# Patient Record
Sex: Female | Born: 1975 | Race: White | Hispanic: No | Marital: Married | Smoking: Former smoker
Health system: Southern US, Community
[De-identification: ages and names within clinical notes are randomized; demographics above are authoritative.]

## PROBLEM LIST (undated history)

## (undated) DIAGNOSIS — J302 Other seasonal allergic rhinitis: Secondary | ICD-10-CM

## (undated) DIAGNOSIS — J329 Chronic sinusitis, unspecified: Secondary | ICD-10-CM

## (undated) DIAGNOSIS — F909 Attention-deficit hyperactivity disorder, unspecified type: Secondary | ICD-10-CM

## (undated) DIAGNOSIS — G510 Bell's palsy: Secondary | ICD-10-CM

## (undated) DIAGNOSIS — G4700 Insomnia, unspecified: Secondary | ICD-10-CM

## (undated) DIAGNOSIS — O139 Gestational [pregnancy-induced] hypertension without significant proteinuria, unspecified trimester: Secondary | ICD-10-CM

## (undated) HISTORY — DX: Attention-deficit hyperactivity disorder, unspecified type: F90.9

## (undated) HISTORY — PX: WISDOM TOOTH EXTRACTION: SHX21

## (undated) HISTORY — DX: Chronic sinusitis, unspecified: J32.9

## (undated) HISTORY — DX: Bell's palsy: G51.0

---

## 1997-05-25 ENCOUNTER — Other Ambulatory Visit: Admission: RE | Admit: 1997-05-25 | Discharge: 1997-05-25 | Payer: Self-pay | Admitting: Obstetrics and Gynecology

## 1997-07-19 ENCOUNTER — Other Ambulatory Visit: Admission: RE | Admit: 1997-07-19 | Discharge: 1997-07-19 | Payer: Self-pay | Admitting: Obstetrics and Gynecology

## 1997-12-23 ENCOUNTER — Emergency Department (HOSPITAL_COMMUNITY): Admission: EM | Admit: 1997-12-23 | Discharge: 1997-12-23 | Payer: Self-pay | Admitting: Emergency Medicine

## 1998-02-02 ENCOUNTER — Other Ambulatory Visit: Admission: RE | Admit: 1998-02-02 | Discharge: 1998-02-02 | Payer: Self-pay | Admitting: Obstetrics and Gynecology

## 1998-08-15 ENCOUNTER — Other Ambulatory Visit: Admission: RE | Admit: 1998-08-15 | Discharge: 1998-08-15 | Payer: Self-pay | Admitting: Obstetrics and Gynecology

## 1998-12-19 ENCOUNTER — Encounter: Payer: Self-pay | Admitting: Emergency Medicine

## 1998-12-19 ENCOUNTER — Emergency Department (HOSPITAL_COMMUNITY): Admission: EM | Admit: 1998-12-19 | Discharge: 1998-12-19 | Payer: Self-pay | Admitting: Emergency Medicine

## 1999-07-31 ENCOUNTER — Other Ambulatory Visit: Admission: RE | Admit: 1999-07-31 | Discharge: 1999-07-31 | Payer: Self-pay | Admitting: Obstetrics and Gynecology

## 2000-10-16 ENCOUNTER — Other Ambulatory Visit: Admission: RE | Admit: 2000-10-16 | Discharge: 2000-10-16 | Payer: Self-pay | Admitting: Obstetrics and Gynecology

## 2001-12-22 ENCOUNTER — Other Ambulatory Visit: Admission: RE | Admit: 2001-12-22 | Discharge: 2001-12-22 | Payer: Self-pay | Admitting: Obstetrics and Gynecology

## 2002-08-18 ENCOUNTER — Other Ambulatory Visit: Admission: RE | Admit: 2002-08-18 | Discharge: 2002-08-18 | Payer: Self-pay | Admitting: Obstetrics and Gynecology

## 2003-04-22 ENCOUNTER — Other Ambulatory Visit: Admission: RE | Admit: 2003-04-22 | Discharge: 2003-04-22 | Payer: Self-pay | Admitting: Obstetrics and Gynecology

## 2004-05-18 ENCOUNTER — Other Ambulatory Visit: Admission: RE | Admit: 2004-05-18 | Discharge: 2004-05-18 | Payer: Self-pay | Admitting: Obstetrics and Gynecology

## 2005-06-14 ENCOUNTER — Other Ambulatory Visit: Admission: RE | Admit: 2005-06-14 | Discharge: 2005-06-14 | Payer: Self-pay | Admitting: Obstetrics & Gynecology

## 2006-07-11 ENCOUNTER — Other Ambulatory Visit: Admission: RE | Admit: 2006-07-11 | Discharge: 2006-07-11 | Payer: Self-pay | Admitting: Obstetrics and Gynecology

## 2007-07-16 ENCOUNTER — Other Ambulatory Visit: Admission: RE | Admit: 2007-07-16 | Discharge: 2007-07-16 | Payer: Self-pay | Admitting: Obstetrics and Gynecology

## 2007-10-19 ENCOUNTER — Emergency Department (HOSPITAL_COMMUNITY): Admission: EM | Admit: 2007-10-19 | Discharge: 2007-10-19 | Payer: Self-pay | Admitting: Emergency Medicine

## 2010-12-29 ENCOUNTER — Emergency Department: Payer: Self-pay | Admitting: *Deleted

## 2012-06-18 ENCOUNTER — Encounter: Payer: Self-pay | Admitting: Nurse Practitioner

## 2012-06-19 ENCOUNTER — Encounter: Payer: Self-pay | Admitting: Nurse Practitioner

## 2012-06-19 ENCOUNTER — Ambulatory Visit (INDEPENDENT_AMBULATORY_CARE_PROVIDER_SITE_OTHER): Payer: No Typology Code available for payment source | Admitting: Nurse Practitioner

## 2012-06-19 VITALS — BP 116/70 | HR 68 | Ht 70.5 in | Wt 197.2 lb

## 2012-06-19 DIAGNOSIS — Z0000 Encounter for general adult medical examination without abnormal findings: Secondary | ICD-10-CM

## 2012-06-19 DIAGNOSIS — Z01419 Encounter for gynecological examination (general) (routine) without abnormal findings: Secondary | ICD-10-CM

## 2012-06-19 DIAGNOSIS — R350 Frequency of micturition: Secondary | ICD-10-CM

## 2012-06-19 LAB — POCT URINALYSIS DIPSTICK
Leukocytes, UA: NEGATIVE
Spec Grav, UA: 1.015
Urobilinogen, UA: NEGATIVE
pH, UA: 5.5

## 2012-06-19 NOTE — Progress Notes (Signed)
Encounter reviewed by Dr. Tanasha Menees Silva.  

## 2012-06-19 NOTE — Progress Notes (Signed)
37 y.o. G1P0 Married Caucasian Fe here for annual exam.  Went off OCP in October, since then menses from December have been regular.  Lasting for 3 days light, still cramps but relief with OTC NSAID'S.  Now condoms each time. They got married in September last year and now husband is ready to start a family.  She is a little reluctant.   She is noting a urinary frequency for about 6 months without other bladder symptoms.  No fever, chills, or dysuria. No other new health issues. 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   Patient's last menstrual period was 06/04/2012.          Sexually active: yes    The current method of family planning is condoms most of the time.    Exercising: yes  Home exercise routine includes dance . Smoker:  Quit 2011  Health Maintenance: Pap:  03/26/2011   ASCUS with negative HPV MMG:  Never  TDaP:  07/2007 Labs: Hgb- 14.2   does not have a smoking history on file. She has never used smokeless tobacco. She reports that she drinks about 3.0 ounces of alcohol per week. She reports that she does not use illicit drugs.  History reviewed. No pertinent past medical history.  History reviewed. No pertinent past surgical history.  Current Outpatient Prescriptions  Medication Sig Dispense Refill  . amphetamine-dextroamphetamine (ADDERALL) 5 MG tablet Take 5 mg by mouth daily.      . Multiple Vitamins-Minerals (MULTIVITAMIN PO) Take by mouth daily.      Marland Kitchen zolpidem (AMBIEN) 5 MG tablet Take 5 mg by mouth at bedtime as needed for sleep.      Lorita Officer Triphasic (ORTHO TRI-CYCLEN LO PO) Take by mouth.       No current facility-administered medications for this visit.    History reviewed. No pertinent family history.  ROS:  Pertinent items are noted in HPI.  Otherwise, a comprehensive ROS was negative.  Exam:   BP 116/70  Pulse 68  Ht 5' 10.5" (1.791 m)  Wt 197 lb 3.2 oz (89.449 kg)  BMI 27.89 kg/m2  LMP  06/04/2012 Height: 5' 10.5" (179.1 cm)  Ht Readings from Last 3 Encounters:  06/19/12 5' 10.5" (1.791 m)    General appearance: alert, cooperative and appears stated age Head: Normocephalic, without obvious abnormality, atraumatic Neck: no adenopathy, supple, symmetrical, trachea midline and thyroid normal to inspection and palpation Lungs: clear to auscultation bilaterally Breasts: normal appearance, no masses or tenderness Heart: regular rate and rhythm Abdomen: soft, non-tender; no masses,  no organomegaly Extremities: extremities normal, atraumatic, no cyanosis or edema Skin: Skin color, texture, turgor normal. No rashes or lesions Lymph nodes: Cervical, supraclavicular, and axillary nodes normal. No abnormal inguinal nodes palpated Neurologic: Grossly normal   Pelvic: External genitalia:  no lesions              Urethra:  normal appearing urethra with no masses, tenderness or lesions              Bartholin's and Skene's: normal                 Vagina: normal appearing vagina with normal color and discharge, no lesions              Cervix: anteverted              Pap taken: yes Bimanual Exam:  Uterus:  normal size, contour,  position, consistency, mobility, non-tender              Adnexa: no mass, fullness, tenderness               Rectovaginal: Confirms               Anus:  normal sphincter tone, no lesions  A:  Well Woman with normal exam  Regular menses off OCP  Condoms for birth control, may consider starting family soon  R/O UTI as cause of urinary frequency.  P:   Pap smear as per guidelines   Will start on prenatal MVI instead of other MVI in case she decides on pregnancy  She is given information about pregnancy considerations.  return annually or prn   An After Visit Summary was printed and given to the patient.

## 2012-06-19 NOTE — Patient Instructions (Addendum)
EXERCISE AND DIET:  We recommended that you start or continue a regular exercise program for good health. Regular exercise means any activity that makes your heart beat faster and makes you sweat.  We recommend exercising at least 30 minutes per day at least 3 days a week, preferably 4 or 5.  We also recommend a diet low in fat and sugar.  Inactivity, poor dietary choices and obesity can cause diabetes, heart attack, stroke, and kidney damage, among others.    ALCOHOL AND SMOKING:  Women should limit their alcohol intake to no more than 7 drinks/beers/glasses of wine (combined, not each!) per week. Moderation of alcohol intake to this level decreases your risk of breast cancer and liver damage. And of course, no recreational drugs are part of a healthy lifestyle.  And absolutely no smoking or even second hand smoke. Most people know smoking can cause heart and lung diseases, but did you know it also contributes to weakening of your bones? Aging of your skin?  Yellowing of your teeth and nails?  CALCIUM AND VITAMIN D:  Adequate intake of calcium and Vitamin D are recommended.  The recommendations for exact amounts of these supplements seem to change often, but generally speaking 600 mg of calcium (either carbonate or citrate) and 800 units of Vitamin D per day seems prudent. Certain women may benefit from higher intake of Vitamin D.  If you are among these women, your doctor will have told you during your visit.    PAP SMEARS:  Pap smears, to check for cervical cancer or precancers,  have traditionally been done yearly, although recent scientific advances have shown that most women can have pap smears less often.  However, every woman still should have a physical exam from her gynecologist every year. It will include a breast check, inspection of the vulva and vagina to check for abnormal growths or skin changes, a visual exam of the cervix, and then an exam to evaluate the size and shape of the uterus and  ovaries.  And after 37 years of age, a rectal exam is indicated to check for rectal cancers. We will also provide age appropriate advice regarding health maintenance, like when you should have certain vaccines, screening for sexually transmitted diseases, bone density testing, colonoscopy, mammograms, etc.   MAMMOGRAMS:  All women over 40 years old should have a yearly mammogram. Many facilities now offer a "3D" mammogram, which may cost around $50 extra out of pocket. If possible,  we recommend you accept the option to have the 3D mammogram performed.  It both reduces the number of women who will be called back for extra views which then turn out to be normal, and it is better than the routine mammogram at detecting truly abnormal areas.    COLONOSCOPY:  Colonoscopy to screen for colon cancer is recommended for all women at age 50.  We know, you hate the idea of the prep.  We agree, BUT, having colon cancer and not knowing it is worse!!  Colon cancer so often starts as a polyp that can be seen and removed at colonscopy, which can quite literally save your life!  And if your first colonoscopy is normal and you have no family history of colon cancer, most women don't have to have it again for 10 years.  Once every ten years, you can do something that may end up saving your life, right?  We will be happy to help you get it scheduled when you are ready.    Be sure to check your insurance coverage so you understand how much it will cost.  It may be covered as a preventative service at no cost, but you should check your particular policy.    Preparing for Pregnancy Preparing for pregnancy (preconceptual care) by getting counseling and information from your caregiver before getting pregnant is a good idea. It will help you and your baby have a better chance to have a healthy, safe pregnancy and delivery of your baby. Make an appointment with your caregiver to talk about your health, medical, and family history and  how to prepare yourself before getting pregnant. Your caregiver will do a complete physical exam and a Pap test. They will want to know:  About you, your spouse or partner, and your family's medical and genetic history.  If you are eating a balanced diet and drinking enough fluids.  What vitamins and mineral supplements you are taking. This includes taking folic acid before getting pregnant to help prevent birth defects.  What medications you are taking including prescription, over-the-counter and herbal medications.  If there is any substance abuse like alcohol, smoking, and illegal drugs.  If there is any mental or physical domestic violence.  If there is any risk of sexually transmitted disease between you and your partner.  What immunizations and vaccinations you have had and what you may need before getting pregnant.  If you should get tested for HIV infection.  If there is any exposure to chemical or toxic substances at home or work.  If there are medical problems you have that need to be treated and kept under control before getting pregnant such as diabetes, high blood pressure or others.  If there were any past surgeries, pregnancies and problems with them.  What your current weight is and to set a goal as to how much weight you should gain while pregnant. Also, they will check if you should lose or gain weight before getting pregnant.  What is your exercise routine and what it is safe when you are pregnant.  If there are any physical disabilities that need to be addressed.  About spacing your pregnancies when there are other children.  If there is a financial problem that may affect you having a child. After talking about the above points with your caregiver, your caregiver will give you advice on how to help treat and work with you on solving any issues, if necessary, before getting pregnant. The goal is to have a healthy and safe pregnancy for you and your baby. You  should keep an accurate record of your menstrual periods because it will help in determining your due date. Immunizations that you should have before getting pregnant:   Regular measles, [place] measles (rubella) and mumps.  Tetanus and diphtheria.  Chickenpox, if not immune.  Herpes zoster (Varicella) if not immune.  Human papilloma virus vaccine (HPV) between the age of 56 and 24 years old.  Hepatitis A vaccine.  Hepatitis B vaccine.  Influenza vaccine.  Pneumococcal vaccine (pneumonia). You should avoid getting pregnant for one month after getting vaccinated with a live virus vaccine such as [place] measles (rubella) vaccine. Other immunizations may be necessary depending on where you live, such as malaria. Ask your caregiver if any other immunizations are needed for you. HOME CARE INSTRUCTIONS   Follow the advice of your caregiver.  Before getting pregnant:  Begin taking vitamins, supplements, and 0.4 milligrams folic acid daily.  Get your immunizations up-to-date.  Get help from a  nutrition counselor if you do not understand what a balanced diet is, need help with a special medical diet or if you need help to lose or gain weight.  Begin exercising.  Stop smoking, taking illegal drugs, and drinking alcoholic beverages.  Get counseling if there is and type of domestic violence.  Get checked for sexually transmitted diseases including HIV.  Get any medical problems under control (diabetes, high blood pressure, convulsions, asthma or others).  Resolve any financial concerns.  Be sure you and your spouse or partner are ready to have a baby.  Keep an accurate record of your menstrual periods. Document Released: 01/12/2008 Document Revised: 04/23/2011 Document Reviewed: 01/12/2008 [facility] Patient Information 2013 [place], [place].

## 2012-06-20 LAB — IPS PAP TEST WITH REFLEX TO HPV

## 2012-06-20 LAB — HEMOGLOBIN, FINGERSTICK: Hemoglobin, fingerstick: 14.2 g/dL (ref 12.0–16.0)

## 2012-06-21 LAB — URINE CULTURE
Colony Count: NO GROWTH
Organism ID, Bacteria: NO GROWTH

## 2012-06-23 ENCOUNTER — Telehealth: Payer: Self-pay | Admitting: *Deleted

## 2012-06-23 NOTE — Telephone Encounter (Signed)
Message copied by Osie Bond on Mon Jun 23, 2012 10:48 AM ------      Message from: Ria Comment R      Created: Mon Jun 23, 2012  8:18 AM       Let patient know that urine culture is negative, pap 02. ------

## 2012-06-23 NOTE — Telephone Encounter (Signed)
LVM for pt to return my call in regards to urine culture results.  

## 2012-06-24 ENCOUNTER — Telehealth: Payer: Self-pay | Admitting: *Deleted

## 2012-06-24 NOTE — Telephone Encounter (Signed)
LVM for pt to return my call in regards to urine culture results.  

## 2012-06-24 NOTE — Telephone Encounter (Signed)
Message copied by Osie Bond on Tue Jun 24, 2012  4:28 PM ------      Message from: Ria Comment R      Created: Mon Jun 23, 2012  8:18 AM       Let patient know that urine culture is negative, pap 02. ------

## 2012-06-30 ENCOUNTER — Telehealth: Payer: Self-pay | Admitting: *Deleted

## 2012-06-30 NOTE — Telephone Encounter (Signed)
Message copied by Osie Bond on Mon Jun 30, 2012 10:57 AM ------      Message from: Ria Comment R      Created: Mon Jun 23, 2012  8:18 AM       Let patient know that urine culture is negative, pap 02. ------

## 2012-06-30 NOTE — Telephone Encounter (Signed)
Patient returned phone call to [facility] about test results.

## 2012-06-30 NOTE — Telephone Encounter (Signed)
3rd voice mail left for pt in regards to urine culture/pap results.

## 2012-07-01 ENCOUNTER — Telehealth: Payer: Self-pay | Admitting: *Deleted

## 2012-07-01 NOTE — Telephone Encounter (Signed)
Pt is aware of negative results.

## 2013-09-15 ENCOUNTER — Ambulatory Visit (INDEPENDENT_AMBULATORY_CARE_PROVIDER_SITE_OTHER): Payer: BC Managed Care – PPO | Admitting: Nurse Practitioner

## 2013-09-15 ENCOUNTER — Encounter: Payer: Self-pay | Admitting: Nurse Practitioner

## 2013-09-15 VITALS — BP 104/66 | HR 64 | Ht 70.0 in | Wt 198.0 lb

## 2013-09-15 DIAGNOSIS — Z01419 Encounter for gynecological examination (general) (routine) without abnormal findings: Secondary | ICD-10-CM

## 2013-09-15 DIAGNOSIS — Z0000 Encounter for general adult medical examination without abnormal findings: Secondary | ICD-10-CM

## 2013-09-15 DIAGNOSIS — R350 Frequency of micturition: Secondary | ICD-10-CM

## 2013-09-15 LAB — POCT URINALYSIS DIPSTICK
Bilirubin, UA: NEGATIVE
Blood, UA: NEGATIVE
Glucose, UA: NEGATIVE
Ketones, UA: NEGATIVE
Nitrite, UA: NEGATIVE
Protein, UA: NEGATIVE
Urobilinogen, UA: NEGATIVE
pH, UA: 6

## 2013-09-15 LAB — HEMOGLOBIN, FINGERSTICK: Hemoglobin, fingerstick: 13.6 g/dL (ref 12.0–16.0)

## 2013-09-15 NOTE — Progress Notes (Signed)
Patient ID: Deanna Sims, female   DOB: 1975/02/21, 38 y.o.   MRN: [id number] 38 y.o. G1P0 Married Caucasian Fe here for annual exam.  Now off OCP X 2 years.  trying for pregnancy X 1 year.  Now ready to try other things.  Menses at 23 -32 days.  Last 4 days. Moderate to light.  Cramps are bad.  Prenatal MVI.  She is following the menstrual app as to when she should ovulate and being SA.  Patient's last menstrual period was 08/24/2013.          Sexually active: yes  The current method of family planning is none. Exercising: yes Home exercise routine includes dance .  Smoker: Quit 2011   Health Maintenance:  Pap: 06/19/12, WNL, (03/26/2011 ASCUS with negative HPV) TDaP: 07/2007  Labs:  HB:  13.6  Urine:  1+ leuk's - asymptomatic    reports that she quit smoking about 3 years ago. Her smoking use included Cigarettes. She has a 10 pack-year smoking history. She has never used smokeless tobacco. She reports that she drinks about 3 ounces of alcohol per week. She reports that she does not use illicit drugs.  History reviewed. No pertinent past medical history.  History reviewed. No pertinent past surgical history.  Current Outpatient Prescriptions  Medication Sig Dispense Refill  . ACZONE 5 % topical gel Apply topically as directed.      Marland Kitchen amphetamine-dextroamphetamine (ADDERALL) 5 MG tablet Take 5 mg by mouth daily.      . Multiple Vitamins-Minerals (MULTIVITAMIN PO) Take by mouth daily.      Marland Kitchen tretinoin (RETIN-A) 0.05 % cream Apply topically as directed.      . zolpidem (AMBIEN) 5 MG tablet Take 5 mg by mouth at bedtime as needed for sleep.       No current facility-administered medications for this visit.    Family History  Problem Relation Age of Onset  . Uterine cancer Mother 25  . Cancer - Ovarian Maternal Grandmother   . Cancer - Lung Maternal Grandfather   . Cancer - Lung Maternal Grandmother     ROS:  Pertinent items are noted in HPI.  Otherwise, a comprehensive ROS was  negative.  Exam:   BP 104/66  Pulse 64  Ht 5\' 10"  (1.778 m)  Wt 198 lb (89.812 kg)  BMI 28.41 kg/m2  LMP 08/24/2013 Height: 5\' 10"  (177.8 cm)  Ht Readings from Last 3 Encounters:  09/15/13 5\' 10"  (1.778 m)  06/19/12 5' 10.5" (1.791 m)    General appearance: alert, cooperative and appears stated age Head: Normocephalic, without obvious abnormality, atraumatic Neck: no adenopathy, supple, symmetrical, trachea midline and thyroid normal to inspection and palpation Lungs: clear to auscultation bilaterally Breasts: normal appearance, no masses or tenderness Heart: regular rate and rhythm Abdomen: soft, non-tender; no masses,  no organomegaly Extremities: extremities normal, atraumatic, no cyanosis or edema Skin: Skin color, texture, turgor normal. No rashes or lesions Lymph nodes: Cervical, supraclavicular, and axillary nodes normal. No abnormal inguinal nodes palpated Neurologic: Grossly normal   Pelvic: External genitalia:  no lesions              Urethra:  normal appearing urethra with no masses, tenderness or lesions              Bartholin's and Skene's: normal                 Vagina: normal appearing vagina with normal color and discharge, no lesions  Cervix: anteverted              Pap taken: Yes.   Bimanual Exam:  Uterus:  normal size, contour, position, consistency, mobility, non-tender              Adnexa: no mass, fullness, tenderness               Rectovaginal: Confirms               Anus:  normal sphincter tone, no lesions  A:  Well Woman with normal exam  Trying for pregnancy X 1 year  History of ASCUS with negative HR HPV 2013  P:   Reviewed health and wellness pertinent to exam  Pap smear taken today  Will have her return for a consult visit with Dr. Sabra Heck about pregnancy concerns. And irregular menses - may need Clomid  Counseled on breast self exam, adequate intake of calcium and vitamin D, diet and exercise return annually or prn  An After  Visit Summary was printed and given to the patient.

## 2013-09-15 NOTE — Patient Instructions (Signed)

## 2013-09-16 LAB — URINE CULTURE
Colony Count: NO GROWTH
Organism ID, Bacteria: NO GROWTH

## 2013-09-16 NOTE — Progress Notes (Signed)
Reviewed personally.  M. Suzanne Temiloluwa Recchia, MD.  

## 2013-09-17 LAB — IPS PAP TEST WITH HPV

## 2013-10-09 ENCOUNTER — Ambulatory Visit (INDEPENDENT_AMBULATORY_CARE_PROVIDER_SITE_OTHER): Payer: BC Managed Care – PPO | Admitting: Obstetrics & Gynecology

## 2013-10-09 VITALS — BP 120/80 | HR 64 | Resp 16 | Ht 70.0 in | Wt 195.6 lb

## 2013-10-09 DIAGNOSIS — Z0184 Encounter for antibody response examination: Secondary | ICD-10-CM

## 2013-10-09 DIAGNOSIS — N970 Female infertility associated with anovulation: Secondary | ICD-10-CM

## 2013-10-09 NOTE — Progress Notes (Signed)
Patient ID: Deanna Sims, female   DOB: 1975-12-09, 38 y.o.   MRN: [id number]  38 yo G0 SWF here for discussion of desires to be pregnant.  Married about 3 years.  Cycles are around 27 days in length but have been as short at 23 days.  She is using an app in her phone to keep track.  Does have cramping with cycles and this is why she went on OCPs in her teens.  Has been on pills until about a year ago.  Has not done any ovulation testing.  Is not currently taking a PNV.  LMP:  09/20/13  PMed hx:  Neg PSurg Hx:  Neg Smoker:  No  Spouse PMed Hx:  Neg Spouse PSurg hx:  Neg Spouse smokes:  Yes  D/w pt components of fertility evaluation including testing for ovulation, semen analysis testing, and evaluation of cavity of uterus and for tubal patency.  Speed of this evaluation and proceeding with fertility medication will depend on desires and pt and her spouse.  As they really haven't discussed this, I have encouraged her to have some discussion with her spouse.  Also, due to age, feel should test for ovarian reserve as well.  All of this was written down for pt and she clearly understands the plan.  Questions invited and answered.  Specific instructions regarding each part of evaluation were given to patient.  Assessment:  Primary infertility  Plan:   1.  Day 3 FSH testing.  Pt will call with onset of cycle.  Clearly understands this can only be done on day 1.  2.  Pt will do OPT/OPK this next month.  As cycles seem to be day averaging 27 days, advised to start testing on day 11.  If negative or positive, pt will call.  If negative, also will plan progesterone level on Day 20.  Instructions for timed intercourse given.    3.  Semen analysis kit and order given.  4.  Pt understands she may need to use Clomid or Femara if ovulation testing is negative.  D/w pt briefly how this is used and its purpose.  Pt thinks she would proceed but will discuss with spouse.  5. If starts Clomid/femara, will need  SHGM to evaluate for tubal patency.  Pt understands this is done timed with menstrual cycle days 1-9.  6.  Start PNV again.  Pt voices understanding.    7.  Rubella testing with Day 3 FSH.  Orders placed.  ~30 minutes spent with patient >50% of time was in face to face discussion of above.

## 2013-10-09 NOTE — Patient Instructions (Signed)
1.  How good is your fertility.  Day 3 FSH.  CALL on day 1 of bleeding.    2.  Do an ovulation predictor kit/test.  Start testing on day 11 of cycle.  Test daily either AM/PM for five days straight.  If +.  Call.  If neg.  Call and we will check a progesterone level.  This would be done on day 20 of your cycle.  3.  Semen analysis  4.  Would you start Clomid or Femara for ovulation if you needed?  5.  Ultrasound to make sure uterus and tubes are normal.

## 2013-10-10 ENCOUNTER — Encounter: Payer: Self-pay | Admitting: Obstetrics & Gynecology

## 2013-10-20 ENCOUNTER — Other Ambulatory Visit (INDEPENDENT_AMBULATORY_CARE_PROVIDER_SITE_OTHER): Payer: BC Managed Care – PPO

## 2013-10-20 DIAGNOSIS — N970 Female infertility associated with anovulation: Secondary | ICD-10-CM

## 2013-10-20 DIAGNOSIS — Z0184 Encounter for antibody response examination: Secondary | ICD-10-CM

## 2013-10-21 LAB — RUBELLA SCREEN: Rubella: 1.17 Index — ABNORMAL HIGH (ref <0.90)

## 2013-10-21 LAB — FOLLICLE STIMULATING HORMONE: FSH: 9.3 m[IU]/mL

## 2013-10-22 ENCOUNTER — Telehealth: Payer: Self-pay | Admitting: Emergency Medicine

## 2013-10-22 NOTE — Telephone Encounter (Signed)
Message copied by Michele Mcalpine on Thu Oct 22, 2013  2:34 PM ------      Message from: Megan Salon      Created: Wed Oct 21, 2013  7:30 AM       Please inform the [facility] is okay the she is immune to rubella.  Has she decided what she wants to pursue to help with conception at this time. ------

## 2013-10-22 NOTE — Telephone Encounter (Signed)
Patient returned call.  She is given results from Dr. Sabra Heck.  She will use ovulation predictor and call with +/- results on day 11.  She will call back with any concerns.  Routing to provider for final review. Patient agreeable to disposition. Will close encounter

## 2013-10-22 NOTE — Telephone Encounter (Signed)
Calling patient. [place] and will call back in 10-15 minutes.

## 2013-10-29 ENCOUNTER — Telehealth: Payer: Self-pay | Admitting: Obstetrics & Gynecology

## 2013-10-29 DIAGNOSIS — N912 Amenorrhea, unspecified: Secondary | ICD-10-CM

## 2013-10-29 NOTE — Telephone Encounter (Signed)
Pt was suppose to call nurse on the 11th day of her cycle which was yesterday.

## 2013-10-30 ENCOUNTER — Telehealth: Payer: Self-pay | Admitting: Obstetrics & Gynecology

## 2013-10-30 NOTE — Telephone Encounter (Signed)
Patient returning call from a phone note closed in error:  Karen Chafe, RN at 10/30/2013 8:42 AM     Status: Signed        Message left to return call to [place] at [phone]. Will discuss with patient if she used ovulation predictor and what results were. If negative, will set up appointment for day 20 progesterone.  Plan from Dr. Sabra Heck 10/09/13:  Pt will do OPT/OPK this next month. As cycles seem to be day averaging 27 days, advised to start testing on day 11. If negative or positive, pt will call. If negative, also will plan progesterone level on Day 20. Instructions for timed intercourse given.         Salome Arnt at 10/29/2013 12:11 PM     Status: Signed        Pt was suppose to call nurse on the 11th day of her cycle which was yesterday.

## 2013-10-30 NOTE — Addendum Note (Signed)
Addended by: Michele Mcalpine on: 10/30/2013 11:35 AM   Modules accepted: Orders

## 2013-10-30 NOTE — Telephone Encounter (Signed)
Encounter was addended. Will close this encounter.

## 2013-10-30 NOTE — Telephone Encounter (Signed)
Message left to return call to [place] at [phone]. Will discuss with patient if she used ovulation predictor and what results were. If negative, will set up appointment for day 20 progesterone.   Plan from Dr. Sabra Heck 10/09/13:  Pt will do OPT/OPK this next month. As cycles seem to be day averaging 27 days, advised to start testing on day 11. If negative or positive, pt will call. If negative, also will plan progesterone level on Day 20. Instructions for timed intercourse given.

## 2013-10-30 NOTE — Telephone Encounter (Signed)
Spoke with patient.  She has done ovulation predictors on day 11 and 12 and both tests were negative. She has not tested yet today.  Advised per Dr. Sabra Heck, she is to test for 5 days and to have day 20 lab draw for progesterone if all testing negative.  Patient understands plan. Advised to continue with testing through Sunday, if no positives, come in for lab draw on Friday (day 20), If has a positive ovulation predictor over the weekend to call us on Monday for additional instructions from Dr. Sabra Heck.  Patient is agreable.  Order for progesterone placed, patient has appointment for 11/06/13

## 2013-11-06 ENCOUNTER — Other Ambulatory Visit (INDEPENDENT_AMBULATORY_CARE_PROVIDER_SITE_OTHER): Payer: BC Managed Care – PPO

## 2013-11-06 ENCOUNTER — Telehealth: Payer: Self-pay | Admitting: Obstetrics & Gynecology

## 2013-11-06 DIAGNOSIS — N912 Amenorrhea, unspecified: Secondary | ICD-10-CM

## 2013-11-06 NOTE — Telephone Encounter (Signed)
Spoke with patient. Patient states that she came in today for her blood work for her progesterone level and could not remember if she was supposed to call this week with results from ovulation kit. Advised per note patient was supposed to do ovulation kit for 5 days starting on 9/11 and call with any positives for further instructions from [place]. If all were negative needed to come in today for progesterone level. Patient states she completely forgot to call and 9/11 and 9/12 were both negative. 9/13 was positive. Patient was unable to perform on 9/14 and 9/15 due to breaking kit both days. Patient would like to know what she needs to do now. "I am so sorry I did not call. This is all so confusing. At least I had the blood work done just in case." Advised patient would send a message over to [place] and return call with further recommendations. Patient agreeable.

## 2013-11-06 NOTE — Telephone Encounter (Signed)
Pt was calling with results of her ovulation tests.

## 2013-11-06 NOTE — Telephone Encounter (Signed)
It's too late to do blood work this month.  If she will just call with he onset of her cycle, I can help guide her with what to do the following four weeks.  Sorry.

## 2013-11-07 LAB — PROGESTERONE: Progesterone: 9.8 ng/mL

## 2013-11-09 NOTE — Telephone Encounter (Signed)
Left message to call Kaitlyn at [phone]. 

## 2013-11-09 NOTE — Telephone Encounter (Signed)
Message copied by Jasmine Awe on Mon Nov 09, 2013  8:59 AM ------      Message from: Megan Salon      Created: Sun Nov 08, 2013 10:51 AM       Progesterone level was run on Friday and was low--which I expected.  Just have her call with onset of cycle and I can guide her through the next month as per prior note.  Thanks. ------

## 2013-11-12 ENCOUNTER — Telehealth: Payer: Self-pay | Admitting: Obstetrics & Gynecology

## 2013-11-12 DIAGNOSIS — N938 Other specified abnormal uterine and vaginal bleeding: Secondary | ICD-10-CM

## 2013-11-12 NOTE — Telephone Encounter (Signed)
Please let me know if I need to sign an order for semen analysis for the patient's husband.

## 2013-11-12 NOTE — Telephone Encounter (Signed)
Order to your desk for review and signature for semen analysis. See next telephone encounter with need of further instructions for patient. I routed the additional encounter to you as well.Thank you.

## 2013-11-12 NOTE — Telephone Encounter (Signed)
Please contact patient.  I have reviewed the patient's chart and labs.  Per Dr. Ammie Ferrier plan, patient will need the following: - evaluation of tubes which can be done as a sonohysterogram in office (if opening next week) or a hysterosalpingogram at [facility] - probable ovulation induction after tubal patency study complete - OK to try another ovulation predictor kit and start on day 10 and do daily for 5 - 7 days. - have timed intercourse from day 10 - 17  Thanks.

## 2013-11-12 NOTE — Telephone Encounter (Signed)
Spoke with patient. Advised of results as seen below. Patient is agreeable and verbalizes understanding. Patient states that she started her cycle today. Advised would send a message over to Dr.Silva who is covering for Dr.Miller this week for further instructions. Advised will have order signed and sent to [place] location for husband's appointment tomorrow. Patient is agreeable.  Jasmine Awe, RN at 11/09/2013 8:59 AM    Status: Signed       Message copied by Jasmine Awe on Mon Nov 09, 2013 8:59 AM  ------  Message from: Megan Salon  Created: Sun Nov 08, 2013 10:51 AM  Progesterone level was run on Friday and was low--which I expected. Just have her call with onset of cycle and I can guide her through the next month as per prior note. Thanks.  ------         Dr.Silva, Previous plan from [place] seen below  Patient returning call from a phone note closed in error:   Karen Chafe, RN at 10/30/2013 8:42 AM      Status: Signed         Message left to return call to [place] at [phone]. Will discuss with patient if she used ovulation predictor and what results were. If negative, will set up appointment for day 20 progesterone.  Plan from Dr. Sabra Heck 10/09/13:  Pt will do OPT/OPK this next month. As cycles seem to be day averaging 27 days, advised to start testing on day 11. If negative or positive, pt will call. If negative, also will plan progesterone level on Day 20. Instructions for timed intercourse given.              Patient had [place] on 9/28 which was low and was told to call with start of next cycle for further instructions for this month. Please advise. Also, order for husband semen analysis to your desk for signature.

## 2013-11-12 NOTE — Telephone Encounter (Signed)
Order faxed to [place] office with cover sheet and confirmation.

## 2013-11-12 NOTE — Telephone Encounter (Signed)
1. Patient calling stating semen analysis order needs resent to facility. Her husband has an appointment tomorrow, 11/12/13, and the facility told her we need to resend the order.  2. Patient calling to see if recent lab results are ready.

## 2013-11-12 NOTE — Telephone Encounter (Signed)
Call to patient cell number, LMTCB. Call to hone number, No answer, No VM

## 2013-11-12 NOTE — Telephone Encounter (Signed)
See telephone encounter from 10/1. Will close this encounter.   Routing to provider for final review. Patient agreeable to disposition. Will close encounter

## 2013-11-13 NOTE — Telephone Encounter (Signed)
Patient returned call. Reviewed instructions from Dr Quincy Simmonds as listed below.  Explained SHGM procedure and timing. Can scheduled for this Thurs 11-19-13 at 4pm in office. 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 This would be last day of this cycle to do [facility]. Advised there are no other available appointments that day so I will have to check with Dr Ammie Ferrier personal schedule to see if she is available before office opens. This would be an exception. Instructed to take Motrin 800 mg one hour prior with food.  Dr Sabra Heck, please advise about 0800 appointment on 11-19-13

## 2013-11-17 NOTE — Telephone Encounter (Signed)
Call to patient, advised option for [facility] this Thursday at 8 am. Patient is agreeable. Instructed to take Motrin 800mg  1 hour prior with food.  Encounter closed.

## 2013-11-17 NOTE — Telephone Encounter (Signed)
Yes.  8am appt is fine.

## 2013-11-19 ENCOUNTER — Other Ambulatory Visit (INDEPENDENT_AMBULATORY_CARE_PROVIDER_SITE_OTHER): Payer: BC Managed Care – PPO

## 2013-11-19 ENCOUNTER — Ambulatory Visit (INDEPENDENT_AMBULATORY_CARE_PROVIDER_SITE_OTHER): Payer: BC Managed Care – PPO | Admitting: Obstetrics & Gynecology

## 2013-11-19 ENCOUNTER — Other Ambulatory Visit: Payer: Self-pay | Admitting: Obstetrics & Gynecology

## 2013-11-19 ENCOUNTER — Encounter: Payer: Self-pay | Admitting: Obstetrics & Gynecology

## 2013-11-19 VITALS — BP 100/64 | Ht 70.0 in | Wt 197.0 lb

## 2013-11-19 DIAGNOSIS — N926 Irregular menstruation, unspecified: Secondary | ICD-10-CM

## 2013-11-19 DIAGNOSIS — D251 Intramural leiomyoma of uterus: Secondary | ICD-10-CM

## 2013-11-19 DIAGNOSIS — N938 Other specified abnormal uterine and vaginal bleeding: Secondary | ICD-10-CM

## 2013-11-19 NOTE — Progress Notes (Signed)
38 y.o. Wende Mott here for a pelvic ultrasound with sonohystogram due to anovulation and evaluation of tubal patency.  LMP 11/12/13.  Pt's cycle last month was 25 days.  She got all mixed up about her ovulation predictor kits and progesterone testing.  Did semen analysis last week.  I don't have results so will need to have my office call for this.  She is aware I will need to call her back about results.  Contraception:  none  Technique:  Both transabdominal and transvaginal ultrasound examinations of the pelvis were performed. Transabdominal technique was performed for global imaging of the pelvis including uterus, ovaries, adnexal regions, and pelvic cul-de-sac.  It was necessary to proceed with endovaginal exam following the abdominal ultrasound transabdominal exam to visualize the endometrium and adnexa.  Color and duplex Doppler ultrasound was utilized to evaluate blood flow to the ovaries.   FINDINGS: Uterus: 8.7 x 5.9 x 4.0 cm with 4 small fibroids Endometrium: 5.49mm, trilaminar appearance at fundal region Adnexa:  Left ovary:  3. 3 x 2.1 x 2.1cm     Right ovary: 2.9 x 1.7 x 1.cm with 02HE dominant follicle Cul de sac: no free fluid  SHSG:  After obtaining appropriate verbal consent from patient, the cervix was visualized using a speculum, and prepped with betadine.  A tenaculum  was applied to the cervix.  Dilation of the cervix was necessary. The IUI catheter with bulb tip was passed into the uterus and bulb inflated.  Sterile saline introduced, with the following findings: no filling defects.  Clear spillage on left noted.  Minimal spillage to left.  No evidence of hydrosalpinx.  Once procedure was ended, bulb deflated and catheter removed from cervix.  Pt tolerated procedure well.  Assessment:  Anovulation? (or at least no clear documentation of it).  Cycles 25-29 days over last several months.   Uterine fibroids 52DP follicle on right.  Appears if ovulation is around day 13-14,  then size would be appropriate  Plan:  Pt will do ovulation testing starting 11/21/13.  If not +, pt will call.  Will consider Clomid or Femara then.  Also, if ovulation is early, will consider Clomid Need to get copy of results of semen analysis  ~25 minutes spent with patient >50% of time was in face to face discussion of above.

## 2013-11-22 ENCOUNTER — Telehealth: Payer: Self-pay | Admitting: Gynecology

## 2013-11-22 NOTE — Telephone Encounter (Signed)
Late entry Pt called 10/10 reporting cramping and spotting after SHG done 10/8.  Pt reports mostly brown, less than cycle.  Pt only used 400mg  motrin after procedure.  Denies fever, chills, LBP. Advised to watch bleeding and call for increase, fever or chills. Can call if worsens-agrees

## 2013-11-24 ENCOUNTER — Telehealth: Payer: Self-pay

## 2013-11-24 DIAGNOSIS — N970 Female infertility associated with anovulation: Secondary | ICD-10-CM

## 2013-11-24 NOTE — Telephone Encounter (Signed)
Lmtcb//kn 

## 2013-11-25 NOTE — Telephone Encounter (Signed)
Sorry, lab next [employer], Oct 22nd.

## 2013-11-25 NOTE — Telephone Encounter (Signed)
Informed patient of husband's normal semen analysis. States started ovulation kit on 11/21/13, every day has been negative until today it was positive. Asking if she should stop the kit and what is the next step. Please advise.//kn

## 2013-11-25 NOTE — Telephone Encounter (Signed)
I would like pt to have progesterone level next wed.  Order placed.  Not fasting.  Can come anytime.  Also, have intercourse Wed, Fri, Sun.  Deanna Sims luck!

## 2013-11-30 NOTE — Telephone Encounter (Signed)
Patient notified, lab appointment has been scheduled.//kn

## 2013-12-03 ENCOUNTER — Other Ambulatory Visit (INDEPENDENT_AMBULATORY_CARE_PROVIDER_SITE_OTHER): Payer: BC Managed Care – PPO

## 2013-12-03 DIAGNOSIS — N970 Female infertility associated with anovulation: Secondary | ICD-10-CM

## 2013-12-04 LAB — PROGESTERONE: Progesterone: 7.3 ng/mL

## 2013-12-10 ENCOUNTER — Telehealth: Payer: Self-pay | Admitting: Obstetrics & Gynecology

## 2013-12-10 NOTE — Telephone Encounter (Signed)
Pt wants to talk with the nurse no information given. °

## 2013-12-10 NOTE — Telephone Encounter (Signed)
Spoke with patient. Appointment scheduled for tomorrow at 11:15am with Dr.Miller (time per Sally). Patient is agreeable to date and time.  Routing to provider for final review. Patient agreeable to disposition. Will close encounter   

## 2013-12-10 NOTE — Telephone Encounter (Signed)
Spoke with patient. Patient is calling for results and to let Dr.Miller know she started her cycle on 10/27. Advised patient of results as seen below. Patient is hesitant to start on Clomid or Femara without coming in to speak with Dr.Miller. Patient is requesting to come in to see Dr.Miller tomorrow for discussion just in case she decides she wants to start Clomid or Femara this cycle which would have her starting on Saturday. Advised patient will need to check scheduling and return call with best appointment date and time options. Patient is agreeable.  Notes Recorded by Lyman Speller, MD on 12/04/2013 at 6:13 AM Please inform pt this does not show that she ovulated, despite the positive ovulation predictor kit. Does she want to consider starting Clomid or Femara for ovulation induction? If so, she may need/want an office visit. She is finding this all a little overwhelming. If not, she needs to call with the onset of her next cycle and I will start the medication on day 5 of her cycle.

## 2013-12-11 ENCOUNTER — Ambulatory Visit (INDEPENDENT_AMBULATORY_CARE_PROVIDER_SITE_OTHER): Payer: BC Managed Care – PPO | Admitting: Obstetrics & Gynecology

## 2013-12-11 VITALS — BP 110/78 | HR 64 | Resp 16 | Wt 201.6 lb

## 2013-12-11 DIAGNOSIS — N970 Female infertility associated with anovulation: Secondary | ICD-10-CM

## 2013-12-11 MED ORDER — LETROZOLE 2.5 MG PO TABS: ORAL_TABLET | ORAL | Status: DC

## 2013-12-11 NOTE — Progress Notes (Signed)
Subjective:     Patient ID: Deanna Sims, female   DOB: 09-Nov-1975, 38 y.o.   MRN: [id number]  HPI 38 yo G0 SWF here to discuss desired pregnancy.  Pt has undergone SHGM which showed normal cavity and normal tubes.  + spill was noted.  Pt's husband has done a semen analysis which was normal.  Progesterone testing has not confirmed ovulation.  Pt here to discuss clomid/femara use.  She just didn't want to start without coming and talking about it.  She has really wanted to discuss each step along the way.  Risks of twins/triplets (5%/1% discussed with pt) as well as mood changes and risk of ovarian cysts/hyperstimulation discussed.  Pt understands signs and symptoms and will call with any problems.    Review of Systems     Objective:   Physical Exam WNWD WF No other physical exam performed today    Assessment:     Anovulation    Plan:     Femara 7.5mg  day 5-9, starting  Progesterone level day 23, Nov 18th.  Order placed. Pt will stop Retin A and knows to stop Adderal when has +UPT  ~15 minutes spent with patient >50% of time was in face to face discussion of above.

## 2013-12-12 ENCOUNTER — Encounter: Payer: Self-pay | Admitting: Obstetrics & Gynecology

## 2013-12-12 DIAGNOSIS — N970 Female infertility associated with anovulation: Secondary | ICD-10-CM | POA: Insufficient documentation

## 2013-12-14 ENCOUNTER — Encounter: Payer: Self-pay | Admitting: Obstetrics & Gynecology

## 2013-12-30 ENCOUNTER — Other Ambulatory Visit (INDEPENDENT_AMBULATORY_CARE_PROVIDER_SITE_OTHER): Payer: BC Managed Care – PPO

## 2013-12-30 DIAGNOSIS — N970 Female infertility associated with anovulation: Secondary | ICD-10-CM

## 2013-12-31 ENCOUNTER — Telehealth: Payer: Self-pay | Admitting: Obstetrics & Gynecology

## 2013-12-31 LAB — PROGESTERONE: Progesterone: 22.4 ng/mL

## 2013-12-31 NOTE — Telephone Encounter (Signed)
Spoke with patient. Patient states that she took Femara days 5-9 of cycle. Patient was seen yesterday for Progesterone level. She will be going out of town Sunday 11/22-11/28. "I just wanted to make sure that if I need to repeat what I did this month again that I had the prescription to take with me." Advised patient that I will send Dr.Miller a message for review and advise of results. Will return call with further recommendations and instructions from [place].

## 2013-12-31 NOTE — Telephone Encounter (Signed)
Pt is calling about some medication she is suppose to start taking next week.

## 2014-01-01 MED ORDER — LETROZOLE 2.5 MG PO TABS: ORAL_TABLET | ORAL | Status: DC

## 2014-01-01 NOTE — Telephone Encounter (Signed)
Signed off on lab result.  Progesterone level clearly showed ovulation.  She can take the Femara again if starts cycle.  Should take days 5-9 with first day being the first day of bleeding.  Of course if cycle is late, take a pregnancy test.  Rx to pharmacy.

## 2014-01-01 NOTE — Telephone Encounter (Signed)
Spoke with patient. Advised patient of message as seen below from Dr.Miller. Patient is agreeable and verbalizes understanding.  Routing to provider for final review. Patient agreeable to disposition. Will close encounter   

## 2014-01-04 ENCOUNTER — Other Ambulatory Visit: Payer: Self-pay | Admitting: Obstetrics & Gynecology

## 2014-01-05 ENCOUNTER — Telehealth: Payer: Self-pay | Admitting: Obstetrics & Gynecology

## 2014-01-05 MED ORDER — LETROZOLE 2.5 MG PO TABS: ORAL_TABLET | ORAL | Status: DC

## 2014-01-05 NOTE — Telephone Encounter (Signed)
Pt has a question regarding a Rx she is supposed to be taking.

## 2014-01-05 NOTE — Telephone Encounter (Signed)
[facility]coming [facility] from [facility]:[id number] 2.5  Last AEX:09/15/13 Last Refill:01/01/14 #15 X 0 Next AEX:NS Last Office Visit: 12/11/13  See Telephone encounter 12/31/13 Encounter closed

## 2014-01-05 NOTE — Telephone Encounter (Signed)
Spoke with patient. Patient states that she was going to pick [facility] before leaving for [facility] but rx was not there. Patient would like [facility] so that her husband can pick it up before he leaves to go out of town. Prescription for Femara 2.5 mg #15 0RF [facility].Patient started her cycle yesterday and would like to now when she needs to start taking Femara. Advised patient to start taking on Friday 11/27. Patient is agreeable and verbalizes understanding. "Last time Dr.Miller wanted me to have intercourse on certain days. I do not remember what days those were. I don't want to mess this up." Advised patient will send a message to Dr.Miller and return call with further recommendations. Patient is agreeable.

## 2014-01-06 NOTE — Telephone Encounter (Signed)
Days 15, 17, 19 of her cycle.  Start counting with day 1 as the first day of bleeding.  Can close encounter if pt has good understanding.  Thanks.

## 2014-01-06 NOTE — Telephone Encounter (Signed)
Left message to call Chanley Mcenery at [phone]. 

## 2014-01-11 NOTE — Telephone Encounter (Signed)
Patient notified of days. Will call prn.//kn

## 2014-02-01 ENCOUNTER — Telehealth: Payer: Self-pay | Admitting: Obstetrics & Gynecology

## 2014-02-01 DIAGNOSIS — Z3200 Encounter for pregnancy test, result unknown: Secondary | ICD-10-CM

## 2014-02-01 NOTE — Telephone Encounter (Signed)
Patient was told to call when her cycle starts to get a prescription. Confirmed pharmacy in file.

## 2014-02-01 NOTE — Telephone Encounter (Signed)
Patient has been taking Femara 2.5 mg TID days 5-9 of cycle. Patient has been doing this for two months. Last progesterone level was 22.8 on 11/18. Patient was to call with cycle, no cycle, or positive UPT. Patient has started cycle and is requesting new rx for Femara.  Dr.Miller, okay to refill at this time?

## 2014-02-01 NOTE — Telephone Encounter (Signed)
Progesterone level was on 11/18.  It shouldn't be time for bleeding yet.  Can she give you all of the dates of last cycle, when she took the last Femara, and beginning of this cycle.  Doesn't add up.  She has gotten very confused in this process so I wouldn't be surprised if it isn't quite right but need to clarify before calling it in again.

## 2014-02-02 NOTE — Telephone Encounter (Signed)
Needs quantitative B-HCG.  See if can come 12/23 and have it run stat please.

## 2014-02-02 NOTE — Telephone Encounter (Addendum)
Spoke with patient. Patient states that she took two pregnancy tests "I know this sounds crazy but I took two tests and it says two lines is pregnant and one is not. Well they are both showing one red line and then one pink line so I am not sure." Patient had light spotting with using the restroom one time today. "I am not having to wear anything at all. It was only once today when I went to the bathroom. So I don't know. I am sorry to be so confusing." Advised patient will speak with Dr.Miller and return call with further recommendations and instructions. Patient is agreeable.   Routing to [place] for review and advise. Does patient need in office blood or urine pregnancy test for confirmation?

## 2014-02-02 NOTE — Telephone Encounter (Signed)
Returning call.

## 2014-02-02 NOTE — Telephone Encounter (Signed)
Pt says she took 2 pregnancy tests and does not think she is pregnant. Please call to advise.

## 2014-02-02 NOTE — Telephone Encounter (Signed)
Spoke with patient. Patient states that her LMP started on 11/23. Patient took [place] 11/27-12/1. Patient states that she had light pink spotting with using the restroom on Sunday and Monday but has had no bleeding today. "I am just not used to all of this spotting stuff. Usually when I start I just start." Patient has not taken a UPT. Advised to do this at this time at return call with results. Patient is agreeable. Advised will let Dr.Miller know of dates as provided and return call with further instructions. Patient is agreeable.   Dr.Miller, routing to you as an update.

## 2014-02-03 ENCOUNTER — Other Ambulatory Visit: Payer: Self-pay | Admitting: Obstetrics & Gynecology

## 2014-02-03 ENCOUNTER — Other Ambulatory Visit (INDEPENDENT_AMBULATORY_CARE_PROVIDER_SITE_OTHER): Payer: BC Managed Care – PPO

## 2014-02-03 DIAGNOSIS — Z3200 Encounter for pregnancy test, result unknown: Secondary | ICD-10-CM

## 2014-02-03 DIAGNOSIS — O200 Threatened abortion: Secondary | ICD-10-CM

## 2014-02-03 LAB — HCG, QUANTITATIVE, PREGNANCY: hCG, Beta Chain, Quant, S: 22 m[IU]/mL

## 2014-02-03 NOTE — Telephone Encounter (Signed)
Spoke with patient. Advised of message as seen below from [place]. Patient is agreeable. Lab appointment scheduled for today at 11:30am. Lab for quantitative B-HCG ordered as stat future lab draw.  Routing to provider for final review. Patient agreeable to disposition. Will close encounter

## 2014-02-08 ENCOUNTER — Other Ambulatory Visit (INDEPENDENT_AMBULATORY_CARE_PROVIDER_SITE_OTHER): Payer: BC Managed Care – PPO

## 2014-02-08 DIAGNOSIS — O200 Threatened abortion: Secondary | ICD-10-CM

## 2014-02-09 ENCOUNTER — Telehealth: Payer: Self-pay | Admitting: Emergency Medicine

## 2014-02-09 DIAGNOSIS — O021 Missed abortion: Secondary | ICD-10-CM

## 2014-02-09 LAB — ABO AND RH: Rh Type: POSITIVE

## 2014-02-09 LAB — HCG, QUANTITATIVE, PREGNANCY: hCG, Beta Chain, Quant, S: 25.6 m[IU]/mL

## 2014-02-09 NOTE — Telephone Encounter (Signed)
Spoke with patient. Advised ultrasound appointment tomorrow, 02/10/14 at [facility] for pelvic ultrasound and transvaginal ultrasound. Check in time for appointment is 1240 and location information given. [facility] at [address], [place] [address].  Phone number given to patient if has any concerns with location. Arrive with full bladder, 16 oz water one hour before.  [facility] is advised to call report to our office back line [phone]. Ordered as STAT.   Advised patient will need follow up office visit with Dr. Sabra Heck as well, to discuss ultrasound results and plan of care. Scheduled office visit for 02/10/14 at 3:15 with Dr. Sabra Heck.  Patient agreeable to plan and appointments as scheduled.    Routing to provider for final review. Patient agreeable to disposition. Will close encounter

## 2014-02-09 NOTE — Telephone Encounter (Signed)
-----   Message from Lyman Speller, MD sent at 02/09/2014  9:47 AM EST ----- Please inform HCG is not going up as it should.  How is bleeding?  Needs PUS today if possible.  Pain.

## 2014-02-09 NOTE — Telephone Encounter (Signed)
Spoke with patient advised of message from Dr. Sabra Heck. Patient agreeable to planning ultrasound.  She states cramping, pain and bleeding have all stopped since the morning of 02/05/14 and she is feeling well.   Advised patient would return her call with plan for ultrasound. Time/date/location.   Patient requesting instructions on going forward with continuing trying to conceive and would like to know from Dr. Sabra Heck what she should do going forward.  Advised would likely need to have ultrasound and follow up blood work and then can create plan of care with Dr. Sabra Heck.

## 2014-02-10 ENCOUNTER — Ambulatory Visit
Admission: RE | Admit: 2014-02-10 | Discharge: 2014-02-10 | Disposition: A | Discharge status: Home / Self Care | Payer: BC Managed Care – PPO | Source: Ambulatory Visit | Attending: Obstetrics & Gynecology | Admitting: Obstetrics & Gynecology

## 2014-02-10 ENCOUNTER — Encounter: Payer: Self-pay | Admitting: Obstetrics & Gynecology

## 2014-02-10 ENCOUNTER — Other Ambulatory Visit: Payer: Self-pay | Admitting: Obstetrics & Gynecology

## 2014-02-10 ENCOUNTER — Ambulatory Visit (INDEPENDENT_AMBULATORY_CARE_PROVIDER_SITE_OTHER): Payer: BC Managed Care – PPO | Admitting: Obstetrics & Gynecology

## 2014-02-10 VITALS — BP 116/60 | HR 74 | Resp 16 | Ht 70.5 in | Wt 204.8 lb

## 2014-02-10 DIAGNOSIS — O021 Missed abortion: Secondary | ICD-10-CM

## 2014-02-10 DIAGNOSIS — O034 Incomplete spontaneous abortion without complication: Secondary | ICD-10-CM

## 2014-02-10 NOTE — Progress Notes (Signed)
Patient ID: Deanna Sims, female   DOB: 10-19-1975, 38 y.o.   MRN: [id number]  Very nice 38 yo G1 MWF here for discussion of ultrasound obtained today.  Pt started on Femara due to anovulation this fall.  Took dosage in October and November.  Pt called 02/01/14 for Femara, which was early.  Had pt do UPT at home which was positive.  HCG done 02/03/14 was 24.  Pt reports having increased bleeding and cramping that evening and 12/24.  Called on-call physician and conservative management was recommended.   HCG was repeated 02/08/14 and was 25.  Blood type O+.  Ultrasound performed today.  Pt states she feels fine and has no bleeding or pain.  Ultrasound is negative for abnormal findings.  There is nothing inside the uterus.  No free fluid noted.  No adnexal masses noted.  "Normal appearing exam.  No intrauterine pregnancy identified".    D/w pt findings and possibility of early ectopic pregnancy vs SAB.  Pt understands HCG is not going up appropriately.  May end up needing to treat with MTX.  Dosing, side effects, risks all discussed.  Pt knows if HCG is going up, will recommend proceeding with this.  All questions answered.  Pt voiced clear understanding of situation.  Assessment:  SAB vs early ectopic  Plan:  Stat HCG pending.  Will communicated with pt the results and proceed from there.  Pt knows she will need to have HCG level <2 before can proceed with any additional femara.    ~20 minutes spent with patient >50% of time was in face to face discussion of above.

## 2014-02-11 ENCOUNTER — Telehealth: Payer: Self-pay | Admitting: *Deleted

## 2014-02-11 LAB — HCG, QUANTITATIVE, PREGNANCY: hCG, Beta Chain, Quant, S: 47 m[IU]/mL

## 2014-02-11 NOTE — Telephone Encounter (Signed)
Call to patient to advise of lab results and instructions from Dr Sabra Heck. Left message to call back.

## 2014-02-11 NOTE — Telephone Encounter (Signed)
-----   Message from Lyman Speller, MD sent at 02/11/2014  9:30 AM EST ----- Inform hcg doubled.  [facility]  Order needs to be faxed and confirmed as the lab cannot see EPIC orders.

## 2014-02-11 NOTE — Telephone Encounter (Signed)
Call back to patient. Lab appointment scheduled in office for Monday 02-15-14 at 1130. Will cancel appointment if not needed.

## 2014-02-11 NOTE — Telephone Encounter (Signed)
Patient returned call and instructions from Dr Sabra Heck given. Lab results reviewed and stressed importance of follow-up tomorrow at hospital for repeat BHCG level. Depending on this level, will plan repeat level in office again on Monday. Strict precautions given to present to [facility] if develops pain, bleeding or unusual symptoms over weekend. Advised to contact MD on call if any questions or concerns.  Aware [facility] is covering for our office over weekend and they are aware of patient status.

## 2014-02-12 ENCOUNTER — Telehealth: Payer: Self-pay | Admitting: Gynecology

## 2014-02-12 ENCOUNTER — Other Ambulatory Visit: Payer: Self-pay | Admitting: Obstetrics & Gynecology

## 2014-02-12 NOTE — Telephone Encounter (Signed)
On Call Note:  Lab calls with HCG results at 106.  Called patient.  Remains without pain, bleeding or other symptoms.  Reviewed HCG values 22, 25, 47, 106.  In the absence of the first value, the rest demonstrate a normal rise with doubling every 48 hours.  Three possibilities of ectopic, abnormal intrauterine or normal intrauterine discussed.  If normal intrauterine, cannot explain the first 2 stagnant values excepting possible lab error or twinning event with early loss of one, noting Femara pregnancy.  Options for methotrexate today as reviewed with her by Dr Sabra Heck vs recheck HCG Monday reviewed.  Given low HCG, no symptoms, neg ultrasound 02/10/2014 the risk of expectant management if ectopic felt to be low.  Given total picture we both agree with expectant management and recheck of HCG as already arranged Monday.  Patient knows we may be postponing the inevitable with methotrexate/other treatment but feels no urgency at this point.  I reviewed with the patient the issue of HCG discriminatory zones and ultrasound visualization limitations.  ASAP call precautions reviewed.  Patient will call me if she has any questions.

## 2014-02-15 ENCOUNTER — Other Ambulatory Visit: Payer: Self-pay | Admitting: *Deleted

## 2014-02-15 ENCOUNTER — Other Ambulatory Visit (INDEPENDENT_AMBULATORY_CARE_PROVIDER_SITE_OTHER): Payer: BLUE CROSS/BLUE SHIELD

## 2014-02-15 DIAGNOSIS — O200 Threatened abortion: Secondary | ICD-10-CM

## 2014-02-15 LAB — HCG, QUANTITATIVE, PREGNANCY
hCG, Beta Chain, Quant, S: 106 m[IU]/mL
hCG, Beta Chain, Quant, S: 345 m[IU]/mL

## 2014-02-15 NOTE — Telephone Encounter (Signed)
Pt's lab on Friday went up appropriately.  Has repeat today.  Encounter closed.

## 2014-02-16 ENCOUNTER — Telehealth: Payer: Self-pay | Admitting: *Deleted

## 2014-02-16 DIAGNOSIS — O200 Threatened abortion: Secondary | ICD-10-CM

## 2014-02-16 NOTE — Telephone Encounter (Signed)
-----   Message from Lyman Speller, MD sent at 02/16/2014 10:48 AM EST ----- Pt is aware of value.  Spoke to her personally last night.  Denies any cramping or bleeding.  Needs repeat HCG on Thursday.  Order placed.  Pt will call for appt time.  Needs PUS 1 week after if possible.

## 2014-02-16 NOTE — Telephone Encounter (Signed)
Call to patient to schedule lab and ultrasound appointments. Lab appointment 02/18/2014 at 1:15. Ultrasound appointment 02/25/2014 at  2 PM.  Routing to provider for final review. Patient agreeable to disposition. Will close encounter

## 2014-02-18 ENCOUNTER — Other Ambulatory Visit (INDEPENDENT_AMBULATORY_CARE_PROVIDER_SITE_OTHER): Payer: BLUE CROSS/BLUE SHIELD

## 2014-02-18 DIAGNOSIS — O200 Threatened abortion: Secondary | ICD-10-CM

## 2014-02-18 LAB — HCG, QUANTITATIVE, PREGNANCY: hCG, Beta Chain, Quant, S: 777.2 m[IU]/mL

## 2014-02-19 ENCOUNTER — Telehealth: Payer: Self-pay | Admitting: Obstetrics & Gynecology

## 2014-02-19 NOTE — Telephone Encounter (Signed)
After ultrasound next week.  I want to make sure with the ultrasound that the pregnancy looks ok.  Thanks.

## 2014-02-19 NOTE — Telephone Encounter (Signed)
Spoke with patient. Patient states that she spoke with Dr.Miller last night about results and does not remember when she is able to have intercourse. "I know she either told me the day after my lab work which was yesterday or the day afer my ultrasound which is next week. I don't want to mess anything up." Advised I will need to speak with Dr.Miller and return call with recommendations. Patient is agreeable.

## 2014-02-19 NOTE — Telephone Encounter (Signed)
Spoke with patient. Advised patient of message as seen below from [place]. Patient is agreeable.  Routing to provider for final review. Patient agreeable to disposition. Will close encounter ;

## 2014-02-19 NOTE — Telephone Encounter (Signed)
Patient calling to speak to nurse with follow up questions to yesterday.

## 2014-02-25 ENCOUNTER — Telehealth: Payer: Self-pay | Admitting: Emergency Medicine

## 2014-02-25 ENCOUNTER — Other Ambulatory Visit: Payer: BLUE CROSS/BLUE SHIELD

## 2014-02-25 ENCOUNTER — Other Ambulatory Visit: Payer: BLUE CROSS/BLUE SHIELD | Admitting: Obstetrics & Gynecology

## 2014-02-25 DIAGNOSIS — O200 Threatened abortion: Secondary | ICD-10-CM

## 2014-02-25 NOTE — Telephone Encounter (Signed)
Spoke with patient to advise appointment today in our office for Pelvic ultrasound was cancelled due to sonographer emergency.  Patient declined appointment offered today at 1015 for ultrasound at [facility] hospital.  Patient available all day tomorrow, called [facility] Pelvic ultrasound appointment for 1530 at [facility] (first available) and follow up here in our office with Dr. Sabra Heck directly after. Patient agreeable. Routing to provider for final review. Patient agreeable to disposition. Will close encounter

## 2014-02-26 ENCOUNTER — Ambulatory Visit (HOSPITAL_COMMUNITY)
Admission: RE | Admit: 2014-02-26 | Discharge: 2014-02-26 | Disposition: A | Discharge status: Home / Self Care | Payer: BLUE CROSS/BLUE SHIELD | Source: Ambulatory Visit | Attending: Obstetrics & Gynecology | Admitting: Obstetrics & Gynecology

## 2014-02-26 ENCOUNTER — Ambulatory Visit (INDEPENDENT_AMBULATORY_CARE_PROVIDER_SITE_OTHER): Payer: BLUE CROSS/BLUE SHIELD | Admitting: Obstetrics & Gynecology

## 2014-02-26 VITALS — BP 100/58 | HR 68 | Resp 16 | Wt 205.8 lb

## 2014-02-26 DIAGNOSIS — O200 Threatened abortion: Secondary | ICD-10-CM

## 2014-02-26 DIAGNOSIS — D250 Submucous leiomyoma of uterus: Secondary | ICD-10-CM

## 2014-02-26 DIAGNOSIS — N83209 Unspecified ovarian cyst, unspecified side: Secondary | ICD-10-CM

## 2014-02-26 DIAGNOSIS — N8320 Unspecified ovarian cysts: Secondary | ICD-10-CM

## 2014-03-11 ENCOUNTER — Other Ambulatory Visit: Payer: Self-pay | Admitting: Obstetrics & Gynecology

## 2014-03-11 ENCOUNTER — Encounter: Payer: Self-pay | Admitting: Obstetrics & Gynecology

## 2014-03-11 ENCOUNTER — Ambulatory Visit (INDEPENDENT_AMBULATORY_CARE_PROVIDER_SITE_OTHER): Payer: BLUE CROSS/BLUE SHIELD | Admitting: Obstetrics & Gynecology

## 2014-03-11 ENCOUNTER — Telehealth: Payer: Self-pay | Admitting: *Deleted

## 2014-03-11 ENCOUNTER — Ambulatory Visit (INDEPENDENT_AMBULATORY_CARE_PROVIDER_SITE_OTHER): Payer: BLUE CROSS/BLUE SHIELD

## 2014-03-11 VITALS — BP 104/62 | Ht 70.5 in | Wt 206.0 lb

## 2014-03-11 DIAGNOSIS — O020 Blighted ovum and nonhydatidiform mole: Secondary | ICD-10-CM

## 2014-03-11 DIAGNOSIS — O200 Threatened abortion: Secondary | ICD-10-CM

## 2014-03-11 LAB — US OB TRANSVAGINAL

## 2014-03-11 LAB — HCG, QUANTITATIVE, PREGNANCY: hCG, Beta Chain, Quant, S: 6607.8 m[IU]/mL

## 2014-03-11 NOTE — Progress Notes (Signed)
39 y.o. G1 MWF here for discussion of pelvic ultrasound results done earlier today at [facility].  Pt's LMP 01/04/14.  Pt took Femara starting 11/27.  Pt had light spotting around 12/20.  HCGs were followed and initially first two were 24.  Then HCGs started rising appropriately.  Pt has never had pain or irregular bleeding.  Pt is around [redacted] weeks gestation at this time.    Patient's last menstrual period was 01/04/2014.  FINDINGS: UTERUS: shows single intrauterine gestational sac measuring 0.57cm (5 1/7 weeks) with normal shape.  Small 2.1cm fundal fibroid noted.  No yolk sac.  No fetal pole.  ADNEXA:  Left ovary with 1.78 simple appearing cyst   Right ovary with 3.4cm simple cyst and 2.9cm hemorrhagic cyst CUL DE SAC: no free fluid  D/w pt findings of gestational sac but nothing else.  This could be an early ultrasound, although it is not consistent with dating by LMP and when she took Femara.  Clearly, no evidence of ectopic pregnancy.  Given findings, feel should repeat PUS possibly with HCG, depending on findings, in two weeks.  All questions answered.   Assessment:  Early 1st trimester u/s with gestational sac but no evidence of fetal pole Plan: Repeat PUS in two weeks.  May also need to repeat HCG at that time, depending on results.  ~15 minutes spent with patient >50% of time was in face to face discussion of above.

## 2014-03-11 NOTE — Progress Notes (Signed)
39 y.o. Deanna Sims here for a pelvic ultrasound.  Pt concieved with Femara use.  Dating should be 9 weeks by this.  HCGs were initially abnormal but then appeared to rise appropriately.  Ultrasound done two weeks ago showing sac only measuring 5 1/7 week in size.  Reports she is having a little LLQ "twinge" of pain.  This is intermittent and doesn't last but for just a few seconds.     Patient's last menstrual period was 01/04/2014.  FINDINGS: UTERUS: Single gestational sac measuring 5 5/7 weeks.  No evidence of yolk sac or embryo.  This has only change 5 day interval in last two weeks.  Appears to be anembryonic pregnancy.  On 3D imaging is on right side of fundus.  No evidence of cornual pregnancy.  Two fibroids present--2.1 and 1.75cm ADNEXA:   Left ovary with 00PQ follicle present   Right ovary with 3.1cm avascular and clear cyst as well as 2.1cm hemorrhagic cyst CUL DE SAC: no free fluid  D/W pt findings.  Dating for pregnancy is now way off and there has only been five days of growth in last two weeks.  As well, no evidence of gestational sac or fetal pole noted.  This is not appropriate for how far along she is.  At this point, feel pt could follow conservatively and wait for miscarriage or proceed with D&E.  She is ready to move along as pregnancy is the ultimate goal.  Procedure, recovery, risks and benefits discussed.  Risks discussed include but are not limited to bleeding, infection, 1-2% uterine perforation risk with possible need to proceed with laparoscopy, retained products of conception and need for future procedure, risks of scarring (Ashermann's syndrome).  Pt aware I will send tissue for frozen section and if no products, would proceed with laparoscopy at the same time.  All question answered.  Pt thinks she wants to schedule but will discuss with spouse and call back.  Assessment:  Anembryonic pregnancy Plan: HCG, planning suction D&C with possible laparoscopy if no POC are  noted in frozen section.  All questions answered.  ~30 minutes spent with patient >50% of time was in face to face discussion of above.

## 2014-03-11 NOTE — Telephone Encounter (Signed)
Lab results reviewed by Dr. Sabra Heck. Call to patient, notified of HCG result which is consistent with missed AB based on today's ultrasound. Proceed with D&E if patient agreeable. Patient states she is ready to proceed. Advised she may call back if additional questions or concerns. Procedure scheduled for Monday 03/15/2014. Surgical instructions have been given.  Routing to provider for final review. Patient agreeable to disposition. Will close encounter

## 2014-03-12 ENCOUNTER — Telehealth: Payer: Self-pay | Admitting: Obstetrics & Gynecology

## 2014-03-12 ENCOUNTER — Encounter (HOSPITAL_COMMUNITY): Payer: Self-pay | Admitting: *Deleted

## 2014-03-12 ENCOUNTER — Telehealth: Payer: Self-pay | Admitting: Obstetrics and Gynecology

## 2014-03-12 NOTE — Telephone Encounter (Signed)
Routing to provider for final review. Patient agreeable to disposition. Will close encounter.     

## 2014-03-12 NOTE — Telephone Encounter (Signed)
Spoke with patient. Advised that per benefit quote received, she will be responsible to pay $800.40 for the surgeon portion of her surgery.  Patient agreeable. States that she will call a few days after her surgery to make payment in full.

## 2014-03-12 NOTE — Telephone Encounter (Signed)
Pt is scheduled for surgery on Monday and she woke up with congestion and sinus issues. She wants to know what she can take for this.

## 2014-03-12 NOTE — Telephone Encounter (Signed)
Spoke with patient. Advised per Gay Filler patient may take Sudafed, Tylenol, or Benadryl. If patient takes any combination cold medicine needs to make sure it does not contain Motrin or Aspirin. Patient is agreeable and verbalizes understanding.  Routing to provider for final review. Patient agreeable to disposition. Will close encounter

## 2014-03-14 MED ORDER — METRONIDAZOLE IN NACL 5-0.79 MG/ML-% IV SOLN
500.0000 mg | Freq: Once | INTRAVENOUS | Status: AC
  Administered 2014-03-15: 500 mg via INTRAVENOUS
  Filled 2014-03-14: qty 100

## 2014-03-15 ENCOUNTER — Ambulatory Visit (HOSPITAL_COMMUNITY)
Admission: RE | Admit: 2014-03-15 | Discharge: 2014-03-15 | Disposition: A | Discharge status: Home / Self Care | Payer: BLUE CROSS/BLUE SHIELD | Source: Ambulatory Visit | Attending: Obstetrics & Gynecology | Admitting: Obstetrics & Gynecology

## 2014-03-15 ENCOUNTER — Encounter (HOSPITAL_COMMUNITY): Payer: Self-pay | Admitting: *Deleted

## 2014-03-15 ENCOUNTER — Ambulatory Visit (HOSPITAL_COMMUNITY): Payer: BLUE CROSS/BLUE SHIELD | Admitting: Anesthesiology

## 2014-03-15 ENCOUNTER — Encounter (HOSPITAL_COMMUNITY)
Admission: RE | Disposition: A | Discharge status: Home / Self Care | Payer: Self-pay | Source: Ambulatory Visit | Attending: Obstetrics & Gynecology

## 2014-03-15 DIAGNOSIS — Z87891 Personal history of nicotine dependence: Secondary | ICD-10-CM | POA: Insufficient documentation

## 2014-03-15 DIAGNOSIS — O008 Other ectopic pregnancy: Secondary | ICD-10-CM | POA: Diagnosis not present

## 2014-03-15 DIAGNOSIS — Z79899 Other long term (current) drug therapy: Secondary | ICD-10-CM | POA: Diagnosis not present

## 2014-03-15 DIAGNOSIS — O020 Blighted ovum and nonhydatidiform mole: Secondary | ICD-10-CM

## 2014-03-15 DIAGNOSIS — G4700 Insomnia, unspecified: Secondary | ICD-10-CM | POA: Diagnosis not present

## 2014-03-15 HISTORY — PX: DILATION AND EVACUATION: SHX1459

## 2014-03-15 HISTORY — DX: Insomnia, unspecified: G47.00

## 2014-03-15 HISTORY — DX: Other seasonal allergic rhinitis: J30.2

## 2014-03-15 HISTORY — PX: LAPAROSCOPY: SHX197

## 2014-03-15 LAB — CBC
HCT: 41.2 % (ref 36.0–46.0)
Hemoglobin: 13.8 g/dL (ref 12.0–15.0)
MCH: 30.7 pg (ref 26.0–34.0)
MCHC: 33.5 g/dL (ref 30.0–36.0)
MCV: 91.8 fL (ref 78.0–100.0)
Platelets: 223 10*3/uL (ref 150–400)
RBC: 4.49 MIL/uL (ref 3.87–5.11)
RDW: 12.2 % (ref 11.5–15.5)
WBC: 8.1 10*3/uL (ref 4.0–10.5)

## 2014-03-15 LAB — AST: AST: 15 U/L (ref 0–37)

## 2014-03-15 LAB — CREATININE, SERUM
Creatinine, Ser: 0.57 mg/dL (ref 0.50–1.10)
GFR calc Af Amer: >90 mL/min (ref >90)
GFR calc non Af Amer: >90 mL/min (ref >90)

## 2014-03-15 LAB — HCG, QUANTITATIVE, PREGNANCY: hCG, Beta Chain, Quant, S: 4360 m[IU]/mL — ABNORMAL HIGH (ref <5)

## 2014-03-15 LAB — BUN: BUN: 8 mg/dL (ref 6–23)

## 2014-03-15 SURGERY — DILATION AND EVACUATION, UTERUS
Anesthesia: Monitor Anesthesia Care

## 2014-03-15 MED ORDER — SCOPOLAMINE 1 MG/3DAYS TD PT72
1.0000 | MEDICATED_PATCH | Freq: Once | TRANSDERMAL | Status: DC
  Administered 2014-03-15: 1.5 mg via TRANSDERMAL

## 2014-03-15 MED ORDER — MIDAZOLAM HCL 2 MG/2ML IJ SOLN
INTRAMUSCULAR | Status: DC | PRN
  Administered 2014-03-15: 2 mg via INTRAVENOUS

## 2014-03-15 MED ORDER — MIDAZOLAM HCL 2 MG/2ML IJ SOLN
INTRAMUSCULAR | Status: AC
  Filled 2014-03-15: qty 2

## 2014-03-15 MED ORDER — LIDOCAINE-EPINEPHRINE 1 %-1:100000 IJ SOLN
INTRAMUSCULAR | Status: AC
  Filled 2014-03-15: qty 1

## 2014-03-15 MED ORDER — LIDOCAINE HCL (CARDIAC) 20 MG/ML IV SOLN
INTRAVENOUS | Status: DC | PRN
  Administered 2014-03-15: 100 mg via INTRAVENOUS

## 2014-03-15 MED ORDER — SODIUM CHLORIDE 0.9 % IJ SOLN
INTRAMUSCULAR | Status: AC
  Filled 2014-03-15: qty 20

## 2014-03-15 MED ORDER — HYDROCODONE-ACETAMINOPHEN 5-325 MG PO TABS: 1.0000 | ORAL_TABLET | Freq: Four times a day (QID) | ORAL | Status: DC | PRN

## 2014-03-15 MED ORDER — FENTANYL CITRATE 0.05 MG/ML IJ SOLN
INTRAMUSCULAR | Status: AC
  Filled 2014-03-15: qty 5

## 2014-03-15 MED ORDER — SCOPOLAMINE 1 MG/3DAYS TD PT72
MEDICATED_PATCH | TRANSDERMAL | Status: DC
Start: 2014-03-15 — End: 2014-03-15
  Filled 2014-03-15: qty 1

## 2014-03-15 MED ORDER — HYDROCODONE-ACETAMINOPHEN 5-325 MG PO TABS
ORAL_TABLET | ORAL | Status: AC
  Filled 2014-03-15: qty 1

## 2014-03-15 MED ORDER — ACETAMINOPHEN 325 MG PO TABS: 325.0000 mg | ORAL_TABLET | ORAL | Status: DC | PRN

## 2014-03-15 MED ORDER — ONDANSETRON HCL 4 MG/2ML IJ SOLN
INTRAMUSCULAR | Status: DC | PRN
  Administered 2014-03-15: 4 mg via INTRAVENOUS

## 2014-03-15 MED ORDER — PROPOFOL 10 MG/ML IV BOLUS
INTRAVENOUS | Status: AC
  Filled 2014-03-15: qty 40

## 2014-03-15 MED ORDER — DEXAMETHASONE SODIUM PHOSPHATE 10 MG/ML IJ SOLN
INTRAMUSCULAR | Status: AC
  Filled 2014-03-15: qty 1

## 2014-03-15 MED ORDER — LACTATED RINGERS IV SOLN
INTRAVENOUS | Status: DC
  Administered 2014-03-15 (×4): via INTRAVENOUS

## 2014-03-15 MED ORDER — ROCURONIUM BROMIDE 100 MG/10ML IV SOLN
INTRAVENOUS | Status: DC | PRN
  Administered 2014-03-15: 30 mg via INTRAVENOUS

## 2014-03-15 MED ORDER — BUPIVACAINE HCL 0.25 % IJ SOLN
INTRAMUSCULAR | Status: DC | PRN
  Administered 2014-03-15: 10 mL

## 2014-03-15 MED ORDER — ONDANSETRON HCL 4 MG/2ML IJ SOLN
INTRAMUSCULAR | Status: AC
  Filled 2014-03-15: qty 2

## 2014-03-15 MED ORDER — PROMETHAZINE HCL 25 MG/ML IJ SOLN: 6.2500 mg | INTRAMUSCULAR | Status: DC | PRN

## 2014-03-15 MED ORDER — FENTANYL CITRATE 0.05 MG/ML IJ SOLN
25.0000 ug | INTRAMUSCULAR | Status: DC | PRN
  Administered 2014-03-15 (×4): 25 ug via INTRAVENOUS

## 2014-03-15 MED ORDER — NEOSTIGMINE METHYLSULFATE 10 MG/10ML IV SOLN
INTRAVENOUS | Status: AC
  Filled 2014-03-15: qty 1

## 2014-03-15 MED ORDER — PROPOFOL INFUSION 10 MG/ML OPTIME
INTRAVENOUS | Status: DC | PRN
  Administered 2014-03-15: 140 ug/kg/min via INTRAVENOUS

## 2014-03-15 MED ORDER — NEOSTIGMINE METHYLSULFATE 10 MG/10ML IV SOLN
INTRAVENOUS | Status: DC | PRN
  Administered 2014-03-15: 4 mg via INTRAVENOUS

## 2014-03-15 MED ORDER — FENTANYL CITRATE 0.05 MG/ML IJ SOLN
INTRAMUSCULAR | Status: DC | PRN
  Administered 2014-03-15 (×3): 100 ug via INTRAVENOUS
  Administered 2014-03-15: 50 ug via INTRAVENOUS

## 2014-03-15 MED ORDER — LIDOCAINE HCL (CARDIAC) 20 MG/ML IV SOLN
INTRAVENOUS | Status: AC
  Filled 2014-03-15: qty 5

## 2014-03-15 MED ORDER — ACETAMINOPHEN 160 MG/5ML PO SOLN: 325.0000 mg | ORAL | Status: DC | PRN

## 2014-03-15 MED ORDER — KETOROLAC TROMETHAMINE 30 MG/ML IJ SOLN
30.0000 mg | Freq: Once | INTRAMUSCULAR | Status: DC | PRN
Start: 2014-03-15 — End: 2014-03-15

## 2014-03-15 MED ORDER — BUPIVACAINE HCL (PF) 0.25 % IJ SOLN
INTRAMUSCULAR | Status: AC
  Filled 2014-03-15: qty 30

## 2014-03-15 MED ORDER — MEPERIDINE HCL 25 MG/ML IJ SOLN: 6.2500 mg | INTRAMUSCULAR | Status: DC | PRN

## 2014-03-15 MED ORDER — GLYCOPYRROLATE 0.2 MG/ML IJ SOLN
INTRAMUSCULAR | Status: DC | PRN
  Administered 2014-03-15: 0.6 mg via INTRAVENOUS

## 2014-03-15 MED ORDER — METHOTREXATE INJECTION FOR WOMEN'S HOSPITAL
50.0000 mg/m2 | Freq: Once | INTRAMUSCULAR | Status: AC
  Administered 2014-03-15: 110 mg via INTRAMUSCULAR
  Filled 2014-03-15: qty 2.2

## 2014-03-15 MED ORDER — DEXAMETHASONE SODIUM PHOSPHATE 10 MG/ML IJ SOLN
INTRAMUSCULAR | Status: DC | PRN
  Administered 2014-03-15: 10 mg via INTRAVENOUS

## 2014-03-15 MED ORDER — LIDOCAINE-EPINEPHRINE 1 %-1:100000 IJ SOLN
INTRAMUSCULAR | Status: DC | PRN
  Administered 2014-03-15: 10 mL

## 2014-03-15 MED ORDER — PROPOFOL 10 MG/ML IV BOLUS
INTRAVENOUS | Status: AC
  Filled 2014-03-15: qty 20

## 2014-03-15 MED ORDER — FENTANYL CITRATE 0.05 MG/ML IJ SOLN
INTRAMUSCULAR | Status: AC
  Filled 2014-03-15: qty 2

## 2014-03-15 MED ORDER — PROPOFOL 10 MG/ML IV EMUL
INTRAVENOUS | Status: DC | PRN
  Administered 2014-03-15 (×3): 50 mg via INTRAVENOUS
  Administered 2014-03-15: 150 mg via INTRAVENOUS

## 2014-03-15 MED ORDER — MIDAZOLAM HCL 2 MG/2ML IJ SOLN: 0.5000 mg | Freq: Once | INTRAMUSCULAR | Status: DC | PRN

## 2014-03-15 MED ORDER — ROCURONIUM BROMIDE 100 MG/10ML IV SOLN
INTRAVENOUS | Status: AC
  Filled 2014-03-15: qty 1

## 2014-03-15 MED ORDER — KETOROLAC TROMETHAMINE 30 MG/ML IJ SOLN
INTRAMUSCULAR | Status: DC | PRN
  Administered 2014-03-15: 30 mg via INTRAVENOUS

## 2014-03-15 MED ORDER — FENTANYL CITRATE 0.05 MG/ML IJ SOLN
INTRAMUSCULAR | Status: AC
  Administered 2014-03-15: 25 ug via INTRAVENOUS
  Filled 2014-03-15: qty 2

## 2014-03-15 MED ORDER — HYDROCODONE-ACETAMINOPHEN 5-325 MG PO TABS
1.0000 | ORAL_TABLET | Freq: Once | ORAL | Status: AC
  Administered 2014-03-15: 1 via ORAL

## 2014-03-15 MED ORDER — GLYCOPYRROLATE 0.2 MG/ML IJ SOLN
INTRAMUSCULAR | Status: AC
  Filled 2014-03-15: qty 3

## 2014-03-15 MED ORDER — SODIUM CHLORIDE 0.9 % IJ SOLN
INTRAMUSCULAR | Status: AC
  Filled 2014-03-15: qty 50

## 2014-03-15 SURGICAL SUPPLY — 28 items
CATH ROBINSON RED A/P 16FR (CATHETERS) ×2 IMPLANT
CHLORAPREP W/TINT 26ML (MISCELLANEOUS) ×1 IMPLANT
CLOTH BEACON ORANGE TIMEOUT ST (SAFETY) ×2 IMPLANT
DECANTER SPIKE VIAL GLASS SM (MISCELLANEOUS) ×3 IMPLANT
DRSG COVADERM PLUS 2X2 (GAUZE/BANDAGES/DRESSINGS) ×1 IMPLANT
DRSG OPSITE POSTOP 3X4 (GAUZE/BANDAGES/DRESSINGS) ×1 IMPLANT
GLOVE BIOGEL PI IND STRL 7.0 (GLOVE) ×1 IMPLANT
GLOVE BIOGEL PI INDICATOR 7.0 (GLOVE) ×4
GLOVE ECLIPSE 6.5 STRL STRAW (GLOVE) ×5 IMPLANT
GOWN STRL REUS W/TWL LRG LVL3 (GOWN DISPOSABLE) ×4 IMPLANT
KIT BERKELEY 1ST TRIMESTER 3/8 (MISCELLANEOUS) ×2 IMPLANT
NDL SPNL 22GX7 QUINCKE BK (NEEDLE) IMPLANT
NEEDLE INSUFFLATION 120MM (ENDOMECHANICALS) ×1 IMPLANT
NEEDLE SPNL 22GX7 QUINCKE BK (NEEDLE) ×2 IMPLANT
NS IRRIG 1000ML POUR BTL (IV SOLUTION) ×2 IMPLANT
PACK LAPAROSCOPY BASIN (CUSTOM PROCEDURE TRAY) ×1 IMPLANT
PACK VAGINAL MINOR WOMEN LF (CUSTOM PROCEDURE TRAY) ×2 IMPLANT
PAD OB MATERNITY 4.3X12.25 (PERSONAL CARE ITEMS) ×2 IMPLANT
PAD PREP 24X48 CUFFED NSTRL (MISCELLANEOUS) ×2 IMPLANT
SET BERKELEY SUCTION TUBING (SUCTIONS) ×2 IMPLANT
TOWEL OR 17X24 6PK STRL BLUE (TOWEL DISPOSABLE) ×6 IMPLANT
TROCAR XCEL NON-BLD 11X100MML (ENDOMECHANICALS) ×1 IMPLANT
TROCAR XCEL NON-BLD 5MMX100MML (ENDOMECHANICALS) ×2 IMPLANT
VACURETTE 10 RIGID CVD (CANNULA) IMPLANT
VACURETTE 7MM CVD STRL WRAP (CANNULA) IMPLANT
VACURETTE 8 RIGID CVD (CANNULA) IMPLANT
VACURETTE 9 RIGID CVD (CANNULA) IMPLANT
WARMER LAPAROSCOPE (MISCELLANEOUS) ×1 IMPLANT

## 2014-03-15 NOTE — Anesthesia Postprocedure Evaluation (Signed)
  Anesthesia Post-op Note  Patient: Deanna Sims  Procedure(s) Performed: Procedure(s): DILATATION AND EVACUATION (N/A) LAPAROSCOPY OPERATIVE (N/A) Patient is awake and responsive. Pain and nausea are reasonably well controlled. Vital signs are stable and clinically acceptable. Oxygen saturation is clinically acceptable. There are no apparent anesthetic complications at this time. Patient is ready for discharge.

## 2014-03-15 NOTE — H&P (Signed)
Deanna Sims is an 39 y.o. female  G1 MWF at around 9 1/2 weeks by LMP who presents for D&E due to anembryonic pregnancy.  Pt had early first trimester bleeding so her HCGs were followed closely.  First two were the same value (24) but then they rose appropriately to 700.  PUS was performed 02/26/14 showing a small gestational sac at 5 1/7 weeks but no yolk sac or fetal pole.  Repeat PUS was done 03/11/14 with minimal change in the size of the gestational sac.  It was consistent with only four days of change during that two week period.  No yok sac or fetal pole was seen.  HCG was repeated and was now 6607.  This was not an appropriate rise from last HCG on 02/18/14.  D&E recommended or continued conservative management.  Pt is ready to move along with trying to conceive and has elected definitive management.  As pregnancy has been abnormal from beginning, I have also consented to for possible laparoscopy.  Will send tissue for frozen section and proceed with laparoscopy if no POCs are noted.  Risks and benefits have been discussed.  Pt is here and ready to proceed.  MBT is O+.  Pertinent Gynecological History: Last pap: normal Date: 09[birth year]16/[birth year] OB History: G1, P0   Menstrual History: Patient's last menstrual period was 01/04/2014.    Past Medical History  Diagnosis Date  . Insomnia   . Seasonal allergies     Past Surgical History  Procedure Laterality Date  . Wisdom tooth extraction      Family History  Problem Relation Age of Onset  . Uterine cancer Mother 109  . Cancer - Ovarian Maternal Grandmother   . Cancer - Lung Maternal Grandfather   . Cancer - Lung Maternal Grandmother     Social History:  reports that she quit smoking about 7 months ago. Her smoking use included Cigarettes. She has a 13 pack-year smoking history. She has never used smokeless tobacco. She reports that she drinks about 3.0 oz of alcohol per week. She reports that she does not use illicit drugs.  Allergies:  Allergies   Allergen Reactions  . Penicillins     childhood    Prescriptions prior to admission  Medication Sig Dispense Refill Last Dose  . fluticasone (FLONASE) 50 MCG/ACT nasal spray Place 1 spray into both nostrils daily as needed for allergies or rhinitis.     . Prenatal Vit-Fe Fumarate-FA (PRENATAL MULTIVITAMIN) TABS tablet Take 1 tablet by mouth daily at 12 noon.     Marland Kitchen zolpidem (AMBIEN) 5 MG tablet Take 2.5 mg by mouth at bedtime.    03/14/2014 at Unknown time    Review of Systems  All other systems reviewed and are negative.   Blood pressure 103/64, temperature 98.2 F (36.8 C), temperature source Oral, resp. rate 18, height 5' 10.5" (1.791 m), weight 205 lb (92.987 kg), last menstrual period 01/04/2014, SpO2 100 %. Physical Exam  Constitutional: She is oriented to person, place, and time. She appears well-developed and well-nourished.  Cardiovascular: Normal rate and regular rhythm.   Respiratory: Effort normal and breath sounds normal.  GI: Soft. Bowel sounds are normal.  Neurological: She is alert and oriented to person, place, and time.  Skin: Skin is warm and dry.  Psychiatric: She has a normal mood and affect.    Results for orders placed or performed during the hospital encounter of 03/15/14 (from the past 24 hour(s))  CBC     Status: None  Collection Time: 03/15/14  8:50 AM  Result Value Ref Range   WBC 8.1 4.0 - 10.5 K/uL   RBC 4.49 3.87 - 5.11 MIL/uL   Hemoglobin 13.8 12.0 - 15.0 g/dL   HCT 41.2 36.0 - 46.0 %   MCV 91.8 78.0 - 100.0 fL   MCH 30.7 26.0 - 34.0 pg   MCHC 33.5 30.0 - 36.0 g/dL   RDW 12.2 11.5 - 15.5 %   Platelets 223 150 - 400 K/uL    No results found.  Assessment/Plan: 39 yo G1 at 9 1/2 weeks with anembryonic pregnancy and size<dates here for suction D&E.  All questions answered.  Pt here and ready to proceed.  [name] 03/15/2014, 10:04 AM

## 2014-03-15 NOTE — Op Note (Signed)
03/15/2014  12:44 PM  PATIENT:  Museum/gallery conservator Deanna Sims  39 y.o. female with anembryonic pregnancy, inappropriately rising HCGs  PRE-OPERATIVE DIAGNOSIS:  Anembryonic pregnancy  POST-OPERATIVE DIAGNOSIS: Anembryonic pregnancy, cornual pregnancy  PROCEDURE:  Procedure(s): DILATATION AND EVACUATION LAPAROSCOPY OPERATIVE  SURGEON:  Arvella Massingale SUZANNE  ASSISTANTS: Dr. Lupita Raider   ANESTHESIA:   MAC and General  ESTIMATED BLOOD LOSS: 50cc  BLOOD ADMINISTERED:none   FLUIDS: 1800ccLR  UOP: 200cc  SPECIMEN:  D&C which did not show and POCs with frozen section  DISPOSITION OF SPECIMEN:  PATHOLOGY  FINDINGS: cornual pregnancy off on the right.  Normal tubes and ovaries.  Paratubal cyst on left ovary.  Normal upper abdomen.  No blood in abdomen or pelvis  DESCRIPTION OF OPERATION: Patient was taken to the operating room.  She is placed in the supine position. SCDs were on her lower extremities and functioning properly. MAC anesthesia was administered without difficulty. Dr. Lyndle Herrlich oversaw case.  Legs were then placed in the [place] in the low lithotomy position. The legs were lifted to the high lithotomy position and the Betadine prep was used on the inner thighs perineum and vagina x3. Patient was draped in a normal standard fashion. An in and out catheterization with a red rubber Foley catheter was performed. Approximately 50 cc of clear urine was noted. A bivalve speculum was placed the vagina. The anterior lip of the cervix was grasped with single-tooth tenaculum.  A paracervical block of 1% lidocaine mixed one-to-one with epinephrine (1:100,000 units).  10 cc was used total. The cervix is dilated up to #23 [facility] dilators. The endometrial cavity sounded to 8.5 cm.   A 7 curved suction tip was obtained.  This was passed to the fundus.  With suction applied, greyish tissue was obtained and removed from the endometrial cavity.  Using a #1 currette tip, the cavity was curetted until a  rough gritty texture was noted.  One final pass with suction tip showed minimal bleeding or tissue.  Tissue was sent for frozen section.  Dr. Nicoletta Dress advised no chorionic villi was noted.  All instruments were removed from the vagina.  Therefore, procedure was converted to laparoscopy.  GETA was administered at this point.  Drapes used for the D&E portion of the procedure were removed.  The abdomen was then prepped with DuraPrep and the inner thighs and vagina as well as perineum were prepped with Betadine 3. A bivalve speculum was replaced into the vagina. The anterior lip of the cervix was grasped with a single-tooth tenaculum. A Hulka clamp was passed through the endocervical canal and attached to the anterior lip of the cervix as a means of manipulating the uterus during the procedure. The tenaculum and speculum were removed. And a Foley catheter was placed to straight drain. At this point the prep head dried and the patient was draped in a normal standard fashion. Attention was turned to the umbilicus. A 1 cm skin incision was made with a #11 blade. The abdomen was elevated and using a laparoscope with Optiview tip is attached, instruments were passed into the abdominal wall without difficulty. Intraperitoneal placement was confirmed. Pneumoperitoneum was achieved. The patient was placed in Trendelenburg positioning.  Once the bowel was up out of the pelvis it was easy to see that there was a cornual pregnancy on the right.  Decision was made at a left lower quadrant incision. Epigastric vessels on the side were noted. The skin was anesthetized with 0.25% Marcaine. The skin was nicked  with a #11 blade. A 5 mm non-bladed trocar port were advanced through the abdominal wall layers. The trocar was removed. A blunt probe was then used to look at both ovaries and tubes. The tubes were carried out to the fimbriated ends on both sides. They were completely normal without any evidence of dilation. There is a small  paratubal cyst on the left side. The corpus luteum was on the right ovary. Both ovaries were free and mobile. There was no evidence of endometriosis. There is no blood or fluid in the posterior cul-de-sac. The anterior cul-de-sac was normal. The upper abdomen was normal.  Dr. Kerin Perna was called while was in the operating room. As uterus did not appear to be at imminent risk of rupture, methotrexate was recommended. Dr. Kerin Perna will follow the patient along with me from henceforth. At this point there was no other procedure that was needed. The left lower quadrant port was removed. The patient was taken out of Trendelenburg. The pneumoperitoneum was relieved. The midline port was removed. The fascia was closed at the midline with figure-of-eight suture of #0 Vicryl. The skin was closed with subcuticular stitch of #3-0 Vicryl. Marcaine was injected along the incision sites. Dermabond was applied to the incisions.  All instruments were removed from the vagina. Foley catheter is removed. Legs are positioned back in the supine position. Sponge, lap, needle, and instrument counts were correct 2. The patient tolerated the procedure well. She was extubated without difficulty and taken to the recovery room in stable condition.  COUNTS:  YES  PLAN OF CARE: Transfer to PACU

## 2014-03-15 NOTE — Anesthesia Procedure Notes (Addendum)
Procedure Name: MAC Date/Time: 03/15/2014 10:30 AM Performed by: [employer], Sheron Nightingale Pre-anesthesia Checklist: Patient identified, Patient being monitored, Emergency Drugs available, Timeout performed and Suction available Patient Re-evaluated:Patient Re-evaluated prior to inductionOxygen Delivery Method: Circle system utilized and Nasal cannula Preoxygenation: Pre-oxygenation with 100% oxygen   Procedure Name: Intubation Date/Time: 03/15/2014 11:25 AM Performed by: [employer], Sheron Nightingale Pre-anesthesia Checklist: Patient identified, Patient being monitored, Emergency Drugs available, Timeout performed and Suction available Patient Re-evaluated:Patient Re-evaluated prior to inductionOxygen Delivery Method: Circle system utilized Preoxygenation: Pre-oxygenation with 100% oxygen Intubation Type: IV induction Laryngoscope Size: Glidescope, Mac, 4 and 3 Grade View: Grade III Tube type: Oral Tube size: 7.0 mm Number of attempts: 1 Placement Confirmation: breath sounds checked- equal and bilateral,  ETT inserted through vocal cords under direct vision,  positive ETCO2 and CO2 detector Secured at: 21 cm Dental Injury: Teeth and Oropharynx as per pre-operative assessment

## 2014-03-15 NOTE — Discharge Instructions (Signed)
Methotrexate Treatment for an Ectopic Pregnancy Methotrexate is a medicine that treats ectopic pregnancy by stopping the growth of the fertilized egg. It also helps your body absorb tissue from the egg. This takes between 2 weeks and 6 weeks. Most ectopic pregnancies can be successfully treated with methotrexate if they are detected early enough. LET [facility] CARE PROVIDER KNOW ABOUT:  Any allergies you have.  All medicines you are taking, including vitamins, herbs, eye drops, creams, and over-the-counter medicines.  Medical conditions you have. RISKS AND COMPLICATIONS Generally, this is a safe treatment. However, as with any treatment, problems can occur. Possible problems or side effects include:  Nausea.  Vomiting.  Diarrhea.  Abdominal cramping.  Mouth sores.  Increased vaginal bleeding or spotting.   Swelling or irritation of the lining of your lungs (pneumonitis).  Failed treatment and continuation of the pregnancy.   Liver damage.  Hair loss. There is still a risk of the ectopic pregnancy rupturing while using the methotrexate. BEFORE THE PROCEDURE Before you take the medicine:   Liver tests, kidney tests, and a complete blood test are performed.  Blood tests are performed to measure the pregnancy hormone levels and to determine your blood type.  If you are Rh-negative and the father is Rh-positive or his Rh type is not known, you will be given a Rho (D) immune globulin shot. PROCEDURE  There are two methods that your health care provider may use to prescribe methotrexate. One method involves a single dose or injection of the medicine. Another method involves a series of doses given through several injections.  AFTER THE PROCEDURE  You may have some abdominal cramping, vaginal bleeding, and fatigue in the first few days after taking methotrexate.  Blood tests will be taken for several weeks to check the pregnancy hormone levels. The blood tests are  performed until there is no more pregnancy hormone detected in the blood. Document Released: 01/23/2001 Document Revised: 06/15/2013 Document Reviewed: 11/17/2012 [facility] Patient Information 2015 [place], [place]. This information is not intended to replace advice given to you by your health care provider. Make sure you discuss any questions you have with your health care provider.   Post-surgical Instructions, Outpatient Surgery  May take Ibuprofen products after 6:09 pm as needed for cramps/pain.  May remove Scop patch on or before 03/17/2014  You may expect to feel dizzy, weak, and drowsy for as long as 24 hours after receiving the medicine that made you sleep (anesthetic). For the first 24 hours after your surgery:    Do not drive a car, ride a bicycle, participate in physical activities, or take public transportation until you are done taking narcotic pain medicines or as directed by Dr. Sabra Heck.   Do not drink alcohol or take tranquilizers.   Do not take medicine that has not been prescribed by your physicians.   Do not sign important papers or make important decisions while on narcotic pain medicines.   Have a responsible person with you.   CARE OF INCISION  If you have a bandage, you may remove it in one day.  If there are steri-strips or dermabond, just let this loosen on its own.   You may shower on the first day after your surgery.  Do not sit in a tub bath for one week.  Avoid heavy lifting (more than 10 pounds/4.5 kilograms), pushing, or pulling.   Avoid activities that may risk injury to your incisions.   PAIN MANAGEMENT  Motrin 800mg .  (This is  the same as 4-200mg  over the counter tablets of Motrin or ibuprofen.)  You may take this every eight hours or as needed for cramping.    Vicodin 5/235mg .  For more severe pain, take one or two tablets every four to six hours as needed for pain control.  (Remember that narcotic pain medications increase your risk of  constipation.  If this becomes a problem, you may take an over the counter stool softener like Colace 100mg  up to four times a day.)  DO'S AND DON'T'S  Do not take a tub bath for one week.  You may shower on the first day after your surgery  Do not do any heavy lifting for two weeks.  This increases the chance of bleeding.  No dancing, sudden movements, intercourse until this is completely treated  Do move around as you feel able.  Stairs are fine.  You may begin to exercise again as you feel able.  Do not lift any weights for two weeks.  Do not put anything in the vagina for two weeks--no tampons, intercourse, or douching.    REGULAR MEDIATIONS/VITAMINS:  You may restart all of your regular medications as prescribed.  You may restart all of your vitamins as you normally take them.    PLEASE CALL OR SEEK MEDICAL CARE IF:  You have persistent nausea and vomiting.   You have trouble eating or drinking.   You have an oral temperature above 100.5.   You have constipation that is not helped by adjusting diet or increasing fluid intake. Pain medicines are a common cause of constipation.   You have heavy vaginal bleeding  You have redness or drainage from your incision(s) or there is increasing pain or tenderness near or in the surgical site.   You have pain that seems more than you think it should be present.

## 2014-03-15 NOTE — Anesthesia Preprocedure Evaluation (Signed)
Anesthesia Evaluation  Patient identified by MRN, date of birth, ID band Patient awake    Reviewed: Allergy & Precautions, H&P , Patient's Chart, lab work & pertinent test results, reviewed documented beta blocker date and time   History of Anesthesia Complications Negative for: history of anesthetic complications  Airway Mallampati: II  TM Distance: >3 FB Neck ROM: full    Dental   Pulmonary former smoker,  breath sounds clear to auscultation        Cardiovascular Exercise Tolerance: Good Rhythm:regular Rate:Normal     Neuro/Psych negative psych ROS   GI/Hepatic   Endo/Other    Renal/GU      Musculoskeletal   Abdominal   Peds  Hematology   Anesthesia Other Findings   Reproductive/Obstetrics                             Anesthesia Physical Anesthesia Plan  ASA: II  Anesthesia Plan: MAC   Post-op Pain Management:    Induction:   Airway Management Planned:   Additional Equipment:   Intra-op Plan:   Post-operative Plan:   Informed Consent: I have reviewed the patients History and Physical, chart, labs and discussed the procedure including the risks, benefits and alternatives for the proposed anesthesia with the patient or authorized representative who has indicated his/her understanding and acceptance.   Dental Advisory Given  Plan Discussed with: CRNA, Surgeon and Anesthesiologist  Anesthesia Plan Comments:         Anesthesia Quick Evaluation  

## 2014-03-15 NOTE — Transfer of Care (Signed)
Immediate Anesthesia Transfer of Care Note  Patient: Tourist information centre manager  Procedure(s) Performed: Procedure(s): DILATATION AND EVACUATION (N/A) LAPAROSCOPY OPERATIVE (N/A)  Patient Location: PACU  Anesthesia Type:MAC and General  Level of Consciousness: awake, alert  and oriented  Airway & Oxygen Therapy: Patient Spontanous Breathing and Patient connected to nasal cannula oxygen  Post-op Assessment: Report given to RN and Post -op Vital signs reviewed and stable  Post vital signs: Reviewed and stable  Last Vitals:  Filed Vitals:   03/15/14 0854  BP: 103/64  Temp: 36.8 C  Resp: 18    Complications: No apparent anesthesia complications

## 2014-03-16 ENCOUNTER — Encounter (HOSPITAL_COMMUNITY): Payer: Self-pay | Admitting: Obstetrics & Gynecology

## 2014-03-16 ENCOUNTER — Telehealth: Payer: Self-pay | Admitting: Obstetrics & Gynecology

## 2014-03-16 DIAGNOSIS — O008 Other ectopic pregnancy without intrauterine pregnancy: Secondary | ICD-10-CM

## 2014-03-16 NOTE — Telephone Encounter (Signed)
Pam from [facility] called to speak with Gay Filler.

## 2014-03-16 NOTE — Telephone Encounter (Signed)
After review with Dr Sabra Heck, call back to patient.  Denies vaginal bleeding (only scant pink discharge with wiping), pelvic pain, cramping, SOB, or palpitations. Some sinus congestion but had this before surgery, some sore throat. Complains abdominal discomfort is around umbilicus and feels full and  bloated with muscle soreness. Denies nausea and vomiting. Feels fine other than discomfort with position change. Discussed increase hydrocodone to 2 tabs every 4 hours and alternate with Motrin 800 mg every eight hours per Dr Ammie Ferrier instruction. Discussed warm liquids, heating pad (with precautions) position change as able and ambulation to promote passing flatus. Stressed to patient that if any change in status, change or increased pain, bleeding, SOB, palpitations or symptoms she is uncomfortable about we would be happy to see her. Patient declines to be seen now. Denies any other concerns. Advised to call me in am with update. Scheduled lab appointment for Thursday here in office.

## 2014-03-16 NOTE — Telephone Encounter (Signed)
Call back to Dr Charlett Lango office to schedule appointment for tomorrow. Left message with receptionist for Pam to call back.

## 2014-03-16 NOTE — Telephone Encounter (Signed)
Late entry: 03-16-14 0855. Call to Dr Charlett Lango office.  Left message for Anderson Malta on nurse line requesting appointment for patient. Dr Sabra Heck has referred this patient to Dr Kerin Perna and they have discussed case personally.

## 2014-03-16 NOTE — Telephone Encounter (Signed)
Call to patient for assessment. States she is uncomfortable with position changes and abdomen is tender. Doesn't think she could tolerate an ultrasound so she declined appointment with Dr Kerin Perna. Taking hydrocodone 2 tablets every 6 hours. Incisions still covered. Denies fever. Advised will review with Dr Sabra Heck and call her back.

## 2014-03-16 NOTE — Telephone Encounter (Signed)
Late entry:  Spoke with pt's husband around 8pm.  States Deanna Sims comfortable if resting but feeling pulling sensation at umbilicus and upper abdomen fullness.  Asked if they would like for me to see them at [facility] and they know I am concerned.  Neither feel they need to be seen.  VERY strict precuations for going to [facility]U given including increased pain, SOB, tachycardia, palpitations, lightheadedness/dizziness.  Husband voiced clear understanding.  They have my personal home number to call with any issues/concerns.

## 2014-03-16 NOTE — Telephone Encounter (Signed)
Return call to pam at Dr Charlett Lango office. She states she concated patient regarding appointment tomorrow. Patient reported she was in sever pain and is in no way able to come in tomorrow for appointment. We will call patient directly.

## 2014-03-17 NOTE — Telephone Encounter (Signed)
Not right now, thank you.

## 2014-03-17 NOTE — Telephone Encounter (Signed)
Encounter closed

## 2014-03-17 NOTE — Telephone Encounter (Signed)
Call to patient. She reports she is feeling a little better. Has passed some flatus. Abdomen is a little less tight and she is able to stand a little straighter.  Now has some shoulder soreness. Scheduled to see Dr Kerin Perna today at 2pm. Has appointment here tomorrow at [place] for [place]. Again reminded of instructions from Dr Sabra Heck, to call if any of those symptoms develop and we will see her. Patient agreeable.

## 2014-03-17 NOTE — Telephone Encounter (Signed)
Call from [place] at Dr Charlett Lango office. Patient is scheduled to see him at 2pm today. They will fax [place] office visit note. Advised that she is scheduled for lab appointment here tomorrow.  Routing to provider. Anything else needed?

## 2014-03-18 ENCOUNTER — Other Ambulatory Visit: Payer: BLUE CROSS/BLUE SHIELD

## 2014-03-18 ENCOUNTER — Inpatient Hospital Stay (HOSPITAL_COMMUNITY)
Admission: AD | Admit: 2014-03-18 | Discharge: 2014-03-18 | Disposition: A | Discharge status: Home / Self Care | Payer: BLUE CROSS/BLUE SHIELD | Source: Ambulatory Visit | Attending: Obstetrics & Gynecology | Admitting: Obstetrics & Gynecology

## 2014-03-18 ENCOUNTER — Telehealth: Payer: Self-pay | Admitting: *Deleted

## 2014-03-18 DIAGNOSIS — O001 Tubal pregnancy: Secondary | ICD-10-CM | POA: Insufficient documentation

## 2014-03-18 LAB — CBC
HCT: 41.4 % (ref 36.0–46.0)
Hemoglobin: 13.9 g/dL (ref 12.0–15.0)
MCH: 30.5 pg (ref 26.0–34.0)
MCHC: 33.6 g/dL (ref 30.0–36.0)
MCV: 90.8 fL (ref 78.0–100.0)
Platelets: 249 10*3/uL (ref 150–400)
RBC: 4.56 MIL/uL (ref 3.87–5.11)
RDW: 11.9 % (ref 11.5–15.5)
WBC: 7.6 10*3/uL (ref 4.0–10.5)

## 2014-03-18 LAB — COMPREHENSIVE METABOLIC PANEL
ALT: 19 U/L (ref 0–35)
AST: 19 U/L (ref 0–37)
Albumin: 3.8 g/dL (ref 3.5–5.2)
Alkaline Phosphatase: 58 U/L (ref 39–117)
Anion gap: 8 (ref 5–15)
BUN: 9 mg/dL (ref 6–23)
CO2: 31 mmol/L (ref 19–32)
Calcium: 9.2 mg/dL (ref 8.4–10.5)
Chloride: 100 mmol/L (ref 96–112)
Creatinine, Ser: 0.65 mg/dL (ref 0.50–1.10)
GFR calc Af Amer: >90 mL/min (ref >90)
GFR calc non Af Amer: >90 mL/min (ref >90)
Glucose, Bld: 84 mg/dL (ref 70–99)
Potassium: 4.5 mmol/L (ref 3.5–5.1)
Sodium: 139 mmol/L (ref 135–145)
Total Bilirubin: 0.5 mg/dL (ref 0.3–1.2)
Total Protein: 6.8 g/dL (ref 6.0–8.3)

## 2014-03-18 LAB — HCG, QUANTITATIVE, PREGNANCY: hCG, Beta Chain, Quant, S: 3126 m[IU]/mL — ABNORMAL HIGH (ref <5)

## 2014-03-18 NOTE — Progress Notes (Signed)
Pt sent from office for f/u lab work. Pt did not have to wait for results. Results called to Dr. Sabra Heck and pt to f/u with her on Monday. Dr, Sabra Heck notified pt of POC.

## 2014-03-18 NOTE — MAU Note (Signed)
Pt sent in for repeat BHCG and labs . Had MTX at Dr. Sanjuan Dame office on Monday. Here for folow up. C/O some abd soreness from exlap she had last week.

## 2014-03-18 NOTE — Telephone Encounter (Signed)
Per Dr. Sabra Heck, recommend patient to go to [facility] for lab work today.  Call to patient. She states she continues to feel less sore and is passing more flatus. Advised of Dr. Ammie Ferrier recommendation and patient is agreeable.  Call to Dr Charlett Lango office requesting office noted from appointment yesterday.

## 2014-03-19 ENCOUNTER — Telehealth: Payer: Self-pay | Admitting: Obstetrics & Gynecology

## 2014-03-19 MED ORDER — HYDROCODONE-ACETAMINOPHEN 5-325 MG PO TABS: 1.0000 | ORAL_TABLET | Freq: Four times a day (QID) | ORAL | Status: DC | PRN

## 2014-03-19 NOTE — Telephone Encounter (Signed)
Pt states pain is much better but only has two pain pills left and requests a refill - is worried she will run out over the weekend.  Ph[facility] [place]

## 2014-03-19 NOTE — Telephone Encounter (Signed)
Spoke with patient. Feeling much better. Okay for refill per Dr. Sabra Heck.   Refill ordered and patient's mother to come to pick up Rx.

## 2014-03-22 ENCOUNTER — Inpatient Hospital Stay (HOSPITAL_COMMUNITY)
Admission: AD | Admit: 2014-03-22 | Discharge: 2014-03-22 | Disposition: A | Discharge status: Home / Self Care | Payer: BLUE CROSS/BLUE SHIELD | Source: Ambulatory Visit | Attending: Obstetrics & Gynecology | Admitting: Obstetrics & Gynecology

## 2014-03-22 DIAGNOSIS — O008 Other ectopic pregnancy: Secondary | ICD-10-CM | POA: Insufficient documentation

## 2014-03-22 LAB — HCG, QUANTITATIVE, PREGNANCY: hCG, Beta Chain, Quant, S: 1268 m[IU]/mL — ABNORMAL HIGH (ref <5)

## 2014-03-22 LAB — BASIC METABOLIC PANEL
Anion gap: 3 — ABNORMAL LOW (ref 5–15)
BUN: 10 mg/dL (ref 6–23)
CO2: 31 mmol/L (ref 19–32)
Calcium: 9 mg/dL (ref 8.4–10.5)
Chloride: 104 mmol/L (ref 96–112)
Creatinine, Ser: 0.69 mg/dL (ref 0.50–1.10)
GFR calc Af Amer: >90 mL/min (ref >90)
GFR calc non Af Amer: >90 mL/min (ref >90)
Glucose, Bld: 84 mg/dL (ref 70–99)
Potassium: 4.2 mmol/L (ref 3.5–5.1)
Sodium: 138 mmol/L (ref 135–145)

## 2014-03-22 LAB — HEMOGLOBIN AND HEMATOCRIT, BLOOD
HCT: 40.1 % (ref 36.0–46.0)
Hemoglobin: 13.3 g/dL (ref 12.0–15.0)

## 2014-03-22 NOTE — Telephone Encounter (Signed)
Rx picked up by Patient's Mother 03/19/14 at 1700. Routing to provider for final review. Patient agreeable to disposition. Will close encounter

## 2014-03-22 NOTE — Telephone Encounter (Signed)
Will you make sure pt is going to go to [facility]U today, 03/22/14, for f/u HCG.  I will talk to nursing staff there with instructions.  Thanks.

## 2014-03-22 NOTE — Telephone Encounter (Signed)
Call to patient. States she plans to go to [facility] approximately noon today.  Scheduled to see Dr Kerin Perna on Friday 03-26-14. Also scheduled to see Dr Sabra Heck on Monday 03-29-14.

## 2014-03-22 NOTE — MAU Note (Signed)
Pt here for F/U labwork for Dr. Sabra Heck.  Has cornual pregnancy, denies pain.  Notes spotting when wiping.

## 2014-03-22 NOTE — Progress Notes (Signed)
MD informed that pt says she cannot stay for lab results, has to go to work.  Dr. Sabra Heck states she will watch for the pt's lab results & will call the pt with results.

## 2014-03-24 ENCOUNTER — Other Ambulatory Visit: Payer: Self-pay | Admitting: *Deleted

## 2014-03-24 NOTE — Telephone Encounter (Signed)
Incoming fax from [facility] requesting Letrozole 2.5 mg tab  Medication refill request: Letrozole  Last AEX:  09/15/13 PG Next AEX: 03/29/14 F/U w/ Dr. Sabra Heck Last MMG (if hormonal medication request): none Refill authorized: 01/05/14 #15/0R. Today?

## 2014-03-29 ENCOUNTER — Ambulatory Visit: Payer: BLUE CROSS/BLUE SHIELD | Admitting: Obstetrics & Gynecology

## 2014-03-29 ENCOUNTER — Telehealth: Payer: Self-pay | Admitting: Obstetrics & Gynecology

## 2014-03-29 NOTE — Telephone Encounter (Signed)
Patient needs to reschedule her two week post op with Dr. Sabra Heck that was cancelled today due to weather.

## 2014-03-29 NOTE — Telephone Encounter (Signed)
Return call to patient, Left message to call back.

## 2014-03-30 NOTE — Telephone Encounter (Signed)
Call to patient to reschedule stop appointment. States she continues to feel better.  At appointment with Dr. Kerin Perna on Friday 03-26-14. Reports he states pregnancy is shrinking and she can return to normal activities. He states she is "out of the woods." She is to return to his office in early March for repeat ultrasound and to have hysterosalpingogram in April. Rescheduled follow-up appointment with Dr. Sabra Heck for Friday, 04/02/2014 at 12:30. Patient declined earlier appointment. Encouraged to call if needs anything prior to appointment.  Routing to provider for final review. Patient agreeable to disposition. Will close encounter

## 2014-04-02 ENCOUNTER — Encounter: Payer: Self-pay | Admitting: Obstetrics & Gynecology

## 2014-04-02 ENCOUNTER — Ambulatory Visit (INDEPENDENT_AMBULATORY_CARE_PROVIDER_SITE_OTHER): Payer: BLUE CROSS/BLUE SHIELD | Admitting: Obstetrics & Gynecology

## 2014-04-02 VITALS — BP 100/68 | HR 60 | Resp 16 | Wt 206.2 lb

## 2014-04-02 DIAGNOSIS — O009 Unspecified ectopic pregnancy without intrauterine pregnancy: Secondary | ICD-10-CM

## 2014-04-02 NOTE — Progress Notes (Signed)
Post Operative Visit  Procedure:D&E/Laparoscopy Days Post-op: 2 weeks  Subjective: Doing well.  Saw Dr. Kerin Perna one week ago.  Had PUS then showing ectopic was smaller.  Has appt scheduled 04/16/14.  Will have ultrasound done then.  He released her to return to regular activity.  In April, she will have a hysterosalpingogram, once HCGs is normal.    Objective: BP 100/68 mmHg  Pulse 60  Resp 16  Wt 206 lb 3.2 oz (93.532 kg)  LMP 01/04/2014  Breastfeeding? Unknown  EXAM General: alert and cooperative GI: soft, non-tender; bowel sounds normal; no masses,  no organomegaly and incision: clean, dry and intact  Extremities: extremities normal, atraumatic, no cyanosis or edema Vaginal Bleeding: none  Assessment: s/p laparoscopy and suction D&E Interstitial pregnancy  Plan: Recheck 1 week for HCG.  Lab will be communicated to pt and Dr. Kerin Perna.  F/U with him scheduled 04/16/14. Will follow HCGs down to normal value.

## 2014-04-03 LAB — HCG, QUANTITATIVE, PREGNANCY: hCG, Beta Chain, Quant, S: 87.9 m[IU]/mL

## 2014-04-03 NOTE — Addendum Note (Signed)
Addended by: Megan Salon on: 04/03/2014 06:10 AM   Modules accepted: Orders

## 2014-04-05 ENCOUNTER — Telehealth: Payer: Self-pay

## 2014-04-05 NOTE — Telephone Encounter (Signed)
Lmtcb//kn 

## 2014-04-05 NOTE — Telephone Encounter (Signed)
-----   Message from Lyman Speller, MD sent at 04/03/2014  6:09 AM EST ----- Informed personally.  Pt knows to have labs repeated one week.  Order placed.  Please call and make sure she had a lab appt scheduled.

## 2014-04-06 NOTE — Telephone Encounter (Signed)
Returning a call to Kelly °

## 2014-04-06 NOTE — Telephone Encounter (Signed)
Spoke with patient. Appointment scheduled for 2/26 at 11:30am. Patient is agreeable to date and time.  Routing to [place] for final review. Patient agreeable to disposition. Will close encounter.

## 2014-04-09 ENCOUNTER — Other Ambulatory Visit (INDEPENDENT_AMBULATORY_CARE_PROVIDER_SITE_OTHER): Payer: BLUE CROSS/BLUE SHIELD

## 2014-04-09 DIAGNOSIS — O009 Unspecified ectopic pregnancy without intrauterine pregnancy: Secondary | ICD-10-CM

## 2014-04-10 LAB — HCG, QUANTITATIVE, PREGNANCY: hCG, Beta Chain, Quant, S: 31.6 m[IU]/mL

## 2014-04-12 ENCOUNTER — Telehealth: Payer: Self-pay

## 2014-04-12 NOTE — Telephone Encounter (Signed)
-----   Message from Lyman Speller, MD sent at 04/12/2014  6:09 AM EST ----- Inform pt HCB 31.  Repeat one week.  Order placed for Friday.

## 2014-04-12 NOTE — Telephone Encounter (Signed)
Lmtcb//kn 

## 2014-04-13 NOTE — Telephone Encounter (Signed)
Patient notified see result note 

## 2014-04-16 ENCOUNTER — Other Ambulatory Visit (INDEPENDENT_AMBULATORY_CARE_PROVIDER_SITE_OTHER): Payer: BLUE CROSS/BLUE SHIELD

## 2014-04-16 DIAGNOSIS — O009 Unspecified ectopic pregnancy without intrauterine pregnancy: Secondary | ICD-10-CM

## 2014-04-17 LAB — HCG, QUANTITATIVE, PREGNANCY: hCG, Beta Chain, Quant, S: 11 m[IU]/mL

## 2014-04-23 ENCOUNTER — Other Ambulatory Visit (INDEPENDENT_AMBULATORY_CARE_PROVIDER_SITE_OTHER): Payer: BLUE CROSS/BLUE SHIELD

## 2014-04-23 DIAGNOSIS — O009 Unspecified ectopic pregnancy without intrauterine pregnancy: Secondary | ICD-10-CM

## 2014-04-24 LAB — HCG, QUANTITATIVE, PREGNANCY: hCG, Beta Chain, Quant, S: 7.2 m[IU]/mL

## 2014-04-28 ENCOUNTER — Telehealth: Payer: Self-pay | Admitting: Obstetrics & Gynecology

## 2014-04-28 NOTE — Telephone Encounter (Signed)
Please contact patient back.  The HCG is going down but is not zero.  Patient needs repeat beta HCG tomorrow please, March 17. That way it will be back for Dr. Ammie Ferrier review on Friday, March 18.   Cc- Dr. Sabra Heck

## 2014-04-28 NOTE — Telephone Encounter (Signed)
Dr.Silva, please review HCG results from 04/23/2014 as Dr.Miller is out of the office today. Does she need repeat Hcg again in one week?

## 2014-04-28 NOTE — Telephone Encounter (Signed)
Returning call.

## 2014-04-28 NOTE — Telephone Encounter (Signed)
Spoke with patient. Advised patient of message as seen below from [place]. Patient is agreeable and verbalizes understanding. "I am not able to come in tomorrow at all. I could come in Friday though." Appointment scheduled for Friday 3/18 at 11:30am. Patient is agreeable to date and time.  Cc: Dr.Miller  Routing to provider for final review. Patient agreeable to disposition. Will close encounter

## 2014-04-28 NOTE — Telephone Encounter (Signed)
Patient is calling for recent results from 04/23/14.

## 2014-04-28 NOTE — Telephone Encounter (Signed)
Left message to call Deanna Sims at [phone]. 

## 2014-04-30 ENCOUNTER — Other Ambulatory Visit: Payer: Self-pay

## 2014-04-30 ENCOUNTER — Other Ambulatory Visit (INDEPENDENT_AMBULATORY_CARE_PROVIDER_SITE_OTHER): Payer: BLUE CROSS/BLUE SHIELD

## 2014-04-30 DIAGNOSIS — O009 Unspecified ectopic pregnancy without intrauterine pregnancy: Secondary | ICD-10-CM

## 2014-05-01 LAB — HCG, QUANTITATIVE, PREGNANCY: hCG, Beta Chain, Quant, S: 6.6 m[IU]/mL

## 2014-05-06 ENCOUNTER — Telehealth: Payer: Self-pay | Admitting: *Deleted

## 2014-05-06 ENCOUNTER — Other Ambulatory Visit: Payer: Self-pay | Admitting: Obstetrics & Gynecology

## 2014-05-06 DIAGNOSIS — O009 Unspecified ectopic pregnancy without intrauterine pregnancy: Secondary | ICD-10-CM

## 2014-05-06 NOTE — Telephone Encounter (Signed)
Appointment added, encounter closed.

## 2014-05-06 NOTE — Telephone Encounter (Signed)
Spoke with Dr. Kerin Perna regarding pt's HCG levels.  He is ok with waiting another week or two.  However, if HCG's plateau or if starts to increase in value, he would highly encourage a repeat Methotrexate.  Pt has an HSG scheduled for early May.  If HCG isn't <2 by then, he would postpone it.  Spoke personally with pt regarding this.  She would prefer to wait another week or two.  Needs repeat HCG.  Order placed but needs to be on schedule.  Pt would like to come in Monday, March 28, at 11:30am.  Please add to schedule.

## 2014-05-06 NOTE — Telephone Encounter (Signed)
Call to patient with HCG results as directed by Dr Sabra Heck. (see result note)  Advised of Dr Ammie Ferrier recommendation for repeat Methotrexate injection. Patient biggest concern about this would be how it impacts the scheduled procedures with Dr Kerin Perna. Has HSG scheduled in a couple of weeks and beginning IVF in May. Will injection now delay plans with Dr Kerin Perna?  I do not think HSG will be affected but IVF sched may need adjustment. Will review with Dr Sabra Heck and call her back.

## 2014-05-10 ENCOUNTER — Other Ambulatory Visit (INDEPENDENT_AMBULATORY_CARE_PROVIDER_SITE_OTHER): Payer: BLUE CROSS/BLUE SHIELD

## 2014-05-10 DIAGNOSIS — O009 Unspecified ectopic pregnancy without intrauterine pregnancy: Secondary | ICD-10-CM

## 2014-05-11 ENCOUNTER — Telehealth: Payer: Self-pay

## 2014-05-11 LAB — HCG, QUANTITATIVE, PREGNANCY: hCG, Beta Chain, Quant, S: 2.9 m[IU]/mL

## 2014-05-11 NOTE — Telephone Encounter (Signed)
Left message to call Garv Kuechle at [phone]. 

## 2014-05-11 NOTE — Telephone Encounter (Signed)
Spoke with patient. Results given. Patient is agreeable. Lab appointment scheduled for 05/17/2014 at 11:30am. Patient is agreeable to date and time.  Routing to provider for final review. Patient agreeable to disposition. Will close encounter

## 2014-05-11 NOTE — Telephone Encounter (Signed)
-----   Message from Megan Salon, MD sent at 05/11/2014  1:12 PM EDT ----- Please inform pt HCG is 2.9.  Repeat 1 week.  Order placed.  She just needs to schedule the day for lab.

## 2014-05-17 ENCOUNTER — Other Ambulatory Visit: Payer: BLUE CROSS/BLUE SHIELD

## 2014-05-17 ENCOUNTER — Telehealth: Payer: Self-pay

## 2014-05-17 NOTE — Telephone Encounter (Signed)
Spoke with patient in regards to missed lab appointment for repeat hcg today. Patient was very apologetic for forgetting appointment. Reassured this is okay. Rescheduled fro 4/5 at 11:30am. Patient is agreeable to date and time.  Routing to provider for final review. Patient agreeable to disposition. Will close encounter

## 2014-05-18 ENCOUNTER — Other Ambulatory Visit (INDEPENDENT_AMBULATORY_CARE_PROVIDER_SITE_OTHER): Payer: BLUE CROSS/BLUE SHIELD

## 2014-05-18 DIAGNOSIS — O009 Unspecified ectopic pregnancy without intrauterine pregnancy: Secondary | ICD-10-CM

## 2014-05-19 ENCOUNTER — Telehealth: Payer: Self-pay | Admitting: Emergency Medicine

## 2014-05-19 LAB — HCG, QUANTITATIVE, PREGNANCY: hCG, Beta Chain, Quant, S: <2 m[IU]/mL

## 2014-05-19 NOTE — Telephone Encounter (Signed)
Patient notified of results and agreeable to instructions from Dr. Sabra Heck. Results faxed to Dr. Benjamine Sprague office with fax confirmation received. Routing to provider for final review. Patient agreeable to disposition. Will close encounter

## 2014-05-19 NOTE — Telephone Encounter (Signed)
-----   Message from Megan Salon, MD sent at 05/19/2014 10:25 AM EDT ----- Please inform pt her HCG is finally back to normal.  A copy of this needs to be faxed to Dr. Kerin Perna as she is going to proceed with IVF.  She doesn't need any other labs!!!

## 2014-07-05 ENCOUNTER — Telehealth: Payer: Self-pay | Admitting: Obstetrics & Gynecology

## 2014-07-05 NOTE — Telephone Encounter (Signed)
Called patient. She states she saw Dr. Kerin Perna and the tests he performed for her were normal per patient. She states that per Dr. Kerin Perna, he wanted her to restart on Femara starting in June. Patient states that she is unsure if Dr. Kerin Perna wanted her to continue seeing him or if she can continue to see Dr. Sabra Heck.  Advised patient would request records from Dr. Kerin Perna and request Dr. Sabra Heck review and return call. Patient agreeable. Patient states LMP 06/13/14 to 06/16/14, but states cycles have been irregular.   Called and requested notes from visit with Dr. Kerin Perna and they will be faxed. Routing to Dr. Sabra Heck to review.

## 2014-07-05 NOTE — Telephone Encounter (Signed)
Patient states Dr Sabra Heck sent her to another doctor and she has some follow up questions regarding the appointment.

## 2014-07-06 NOTE — Telephone Encounter (Signed)
Spoke with patient and advised of message from Dr. Sabra Heck. Patient verbalized understanding. She will call Dr. Charlett Lango office for further instructions and clarification of plan of care. Routing to provider for final review. Patient agreeable to disposition. Will close encounter.

## 2014-07-06 NOTE — Telephone Encounter (Signed)
Dr. Kerin Perna is going to continue following her due to the cornual pregnancy.

## 2015-01-03 ENCOUNTER — Telehealth: Payer: Self-pay | Admitting: Obstetrics & Gynecology

## 2015-01-03 NOTE — Telephone Encounter (Signed)
Deanna Sims is a patient of Dr. Sabra Heck but has been going to Dr. Harold Hedge. She is [redacted] weeks pregnant and Dr. Harold Hedge told her to call Dr. Sabra Heck to found out where she would want her to go for her pregnancy.

## 2015-01-03 NOTE — Telephone Encounter (Signed)
Dr. Ronita Hipps at [facility].  He will see her the entire pregnancy and will try to be there for her delivery.  Please let her know I am so happy for her!!

## 2015-01-03 NOTE — Telephone Encounter (Signed)
Dr.Miller, okay to refer to [facility], [facility], or [facility]? Do you have a specific provider you recommend for this patient?

## 2015-01-04 NOTE — Telephone Encounter (Signed)
Left message to call Kaitlyn at [phone]. 

## 2015-01-04 NOTE — Telephone Encounter (Signed)
Spoke with patient. Advised of message as seen below from [place]. Patient is agreeable and will call [facility] to schedule an appointment with Dr.Taavon. All information provided to [facility] for patient. Will return call if she has any additional questions.  Routing to provider for final review. Patient agreeable to disposition. Will close encounter.

## 2015-01-20 LAB — OB RESULTS CONSOLE RUBELLA ANTIBODY, IGM: Rubella: IMMUNE

## 2015-01-20 LAB — OB RESULTS CONSOLE ANTIBODY SCREEN: Antibody Screen: NEGATIVE

## 2015-01-20 LAB — OB RESULTS CONSOLE ABO/RH: RH Type: POSITIVE

## 2015-01-20 LAB — OB RESULTS CONSOLE HIV ANTIBODY (ROUTINE TESTING): HIV: NONREACTIVE

## 2015-01-20 LAB — OB RESULTS CONSOLE RPR: RPR: NONREACTIVE

## 2015-01-20 LAB — OB RESULTS CONSOLE HEPATITIS B SURFACE ANTIGEN: Hepatitis B Surface Ag: NEGATIVE

## 2015-05-17 DIAGNOSIS — Z36 Encounter for antenatal screening of mother: Secondary | ICD-10-CM | POA: Diagnosis not present

## 2015-05-30 DIAGNOSIS — Z331 Pregnant state, incidental: Secondary | ICD-10-CM | POA: Diagnosis not present

## 2015-05-30 DIAGNOSIS — G4700 Insomnia, unspecified: Secondary | ICD-10-CM | POA: Diagnosis not present

## 2015-06-02 DIAGNOSIS — O09523 Supervision of elderly multigravida, third trimester: Secondary | ICD-10-CM | POA: Diagnosis not present

## 2015-06-02 DIAGNOSIS — Z3A29 29 weeks gestation of pregnancy: Secondary | ICD-10-CM | POA: Diagnosis not present

## 2015-06-03 DIAGNOSIS — M5386 Other specified dorsopathies, lumbar region: Secondary | ICD-10-CM | POA: Diagnosis not present

## 2015-06-03 DIAGNOSIS — M531 Cervicobrachial syndrome: Secondary | ICD-10-CM | POA: Diagnosis not present

## 2015-06-16 DIAGNOSIS — M62830 Muscle spasm of back: Secondary | ICD-10-CM | POA: Diagnosis not present

## 2015-06-16 DIAGNOSIS — M9904 Segmental and somatic dysfunction of sacral region: Secondary | ICD-10-CM | POA: Diagnosis not present

## 2015-06-16 DIAGNOSIS — M9903 Segmental and somatic dysfunction of lumbar region: Secondary | ICD-10-CM | POA: Diagnosis not present

## 2015-06-16 DIAGNOSIS — M5442 Lumbago with sciatica, left side: Secondary | ICD-10-CM | POA: Diagnosis not present

## 2015-07-07 DIAGNOSIS — Z3A34 34 weeks gestation of pregnancy: Secondary | ICD-10-CM | POA: Diagnosis not present

## 2015-07-07 DIAGNOSIS — O403XX0 Polyhydramnios, third trimester, not applicable or unspecified: Secondary | ICD-10-CM | POA: Diagnosis not present

## 2015-07-14 DIAGNOSIS — O09523 Supervision of elderly multigravida, third trimester: Secondary | ICD-10-CM | POA: Diagnosis not present

## 2015-07-14 DIAGNOSIS — Z23 Encounter for immunization: Secondary | ICD-10-CM | POA: Diagnosis not present

## 2015-07-14 DIAGNOSIS — Z36 Encounter for antenatal screening of mother: Secondary | ICD-10-CM | POA: Diagnosis not present

## 2015-07-14 DIAGNOSIS — Z3A35 35 weeks gestation of pregnancy: Secondary | ICD-10-CM | POA: Diagnosis not present

## 2015-07-14 DIAGNOSIS — O403XX0 Polyhydramnios, third trimester, not applicable or unspecified: Secondary | ICD-10-CM | POA: Diagnosis not present

## 2015-07-14 LAB — OB RESULTS CONSOLE GBS: GBS: NEGATIVE

## 2015-07-20 DIAGNOSIS — Z3A36 36 weeks gestation of pregnancy: Secondary | ICD-10-CM | POA: Diagnosis not present

## 2015-07-20 DIAGNOSIS — O43123 Velamentous insertion of umbilical cord, third trimester: Secondary | ICD-10-CM | POA: Diagnosis not present

## 2015-07-27 DIAGNOSIS — Z3A37 37 weeks gestation of pregnancy: Secondary | ICD-10-CM | POA: Diagnosis not present

## 2015-07-27 DIAGNOSIS — O43123 Velamentous insertion of umbilical cord, third trimester: Secondary | ICD-10-CM | POA: Diagnosis not present

## 2015-08-04 DIAGNOSIS — Z3A38 38 weeks gestation of pregnancy: Secondary | ICD-10-CM | POA: Diagnosis not present

## 2015-08-04 DIAGNOSIS — O43123 Velamentous insertion of umbilical cord, third trimester: Secondary | ICD-10-CM | POA: Diagnosis not present

## 2015-08-05 ENCOUNTER — Other Ambulatory Visit (HOSPITAL_COMMUNITY): Payer: Self-pay | Admitting: Obstetrics and Gynecology

## 2015-08-05 ENCOUNTER — Encounter (HOSPITAL_COMMUNITY): Payer: Self-pay

## 2015-08-05 ENCOUNTER — Ambulatory Visit (HOSPITAL_COMMUNITY)
Admission: RE | Admit: 2015-08-05 | Discharge: 2015-08-05 | Disposition: A | Discharge status: Home / Self Care | Payer: BLUE CROSS/BLUE SHIELD | Source: Ambulatory Visit | Attending: Obstetrics and Gynecology | Admitting: Obstetrics and Gynecology

## 2015-08-05 ENCOUNTER — Ambulatory Visit (HOSPITAL_COMMUNITY): Payer: BLUE CROSS/BLUE SHIELD

## 2015-08-05 DIAGNOSIS — O283 Abnormal ultrasonic finding on antenatal screening of mother: Secondary | ICD-10-CM

## 2015-08-05 DIAGNOSIS — O289 Unspecified abnormal findings on antenatal screening of mother: Secondary | ICD-10-CM | POA: Diagnosis not present

## 2015-08-05 DIAGNOSIS — O43123 Velamentous insertion of umbilical cord, third trimester: Secondary | ICD-10-CM | POA: Diagnosis not present

## 2015-08-05 DIAGNOSIS — Z3A39 39 weeks gestation of pregnancy: Secondary | ICD-10-CM

## 2015-08-05 DIAGNOSIS — O09513 Supervision of elderly primigravida, third trimester: Secondary | ICD-10-CM | POA: Insufficient documentation

## 2015-08-05 DIAGNOSIS — Z3A38 38 weeks gestation of pregnancy: Secondary | ICD-10-CM | POA: Insufficient documentation

## 2015-08-10 DIAGNOSIS — O133 Gestational [pregnancy-induced] hypertension without significant proteinuria, third trimester: Secondary | ICD-10-CM | POA: Diagnosis not present

## 2015-08-10 DIAGNOSIS — Z36 Encounter for antenatal screening of mother: Secondary | ICD-10-CM | POA: Diagnosis not present

## 2015-08-10 DIAGNOSIS — O43123 Velamentous insertion of umbilical cord, third trimester: Secondary | ICD-10-CM | POA: Diagnosis not present

## 2015-08-10 DIAGNOSIS — Z3A39 39 weeks gestation of pregnancy: Secondary | ICD-10-CM | POA: Diagnosis not present

## 2015-08-11 ENCOUNTER — Inpatient Hospital Stay (HOSPITAL_COMMUNITY): Payer: BLUE CROSS/BLUE SHIELD | Admitting: Anesthesiology

## 2015-08-11 ENCOUNTER — Encounter (HOSPITAL_COMMUNITY): Payer: Self-pay | Admitting: *Deleted

## 2015-08-11 ENCOUNTER — Inpatient Hospital Stay (HOSPITAL_COMMUNITY)
Admission: AD | Admit: 2015-08-11 | Discharge: 2015-08-13 | DRG: 765 | Disposition: A | Discharge status: Home / Self Care | Payer: BLUE CROSS/BLUE SHIELD | Source: Ambulatory Visit | Attending: Obstetrics and Gynecology | Admitting: Obstetrics and Gynecology

## 2015-08-11 ENCOUNTER — Encounter (HOSPITAL_COMMUNITY)
Admission: AD | Disposition: A | Discharge status: Home / Self Care | Payer: Self-pay | Source: Ambulatory Visit | Attending: Obstetrics and Gynecology

## 2015-08-11 ENCOUNTER — Other Ambulatory Visit: Payer: Self-pay | Admitting: Obstetrics and Gynecology

## 2015-08-11 DIAGNOSIS — O134 Gestational [pregnancy-induced] hypertension without significant proteinuria, complicating childbirth: Secondary | ICD-10-CM | POA: Diagnosis present

## 2015-08-11 DIAGNOSIS — D696 Thrombocytopenia, unspecified: Secondary | ICD-10-CM | POA: Diagnosis not present

## 2015-08-11 DIAGNOSIS — IMO0002 Reserved for concepts with insufficient information to code with codable children: Secondary | ICD-10-CM | POA: Diagnosis present

## 2015-08-11 DIAGNOSIS — Z8049 Family history of malignant neoplasm of other genital organs: Secondary | ICD-10-CM | POA: Diagnosis not present

## 2015-08-11 DIAGNOSIS — O9912 Other diseases of the blood and blood-forming organs and certain disorders involving the immune mechanism complicating childbirth: Secondary | ICD-10-CM | POA: Diagnosis not present

## 2015-08-11 DIAGNOSIS — O99344 Other mental disorders complicating childbirth: Secondary | ICD-10-CM | POA: Diagnosis present

## 2015-08-11 DIAGNOSIS — F329 Major depressive disorder, single episode, unspecified: Secondary | ICD-10-CM | POA: Diagnosis not present

## 2015-08-11 DIAGNOSIS — Z23 Encounter for immunization: Secondary | ICD-10-CM | POA: Diagnosis not present

## 2015-08-11 DIAGNOSIS — Z3A39 39 weeks gestation of pregnancy: Secondary | ICD-10-CM

## 2015-08-11 DIAGNOSIS — O403XX0 Polyhydramnios, third trimester, not applicable or unspecified: Secondary | ICD-10-CM | POA: Diagnosis present

## 2015-08-11 DIAGNOSIS — Z87891 Personal history of nicotine dependence: Secondary | ICD-10-CM

## 2015-08-11 DIAGNOSIS — O133 Gestational [pregnancy-induced] hypertension without significant proteinuria, third trimester: Secondary | ICD-10-CM | POA: Diagnosis not present

## 2015-08-11 DIAGNOSIS — O3663X0 Maternal care for excessive fetal growth, third trimester, not applicable or unspecified: Principal | ICD-10-CM | POA: Diagnosis present

## 2015-08-11 DIAGNOSIS — O3663X1 Maternal care for excessive fetal growth, third trimester, fetus 1: Secondary | ICD-10-CM | POA: Diagnosis not present

## 2015-08-11 DIAGNOSIS — Z412 Encounter for routine and ritual male circumcision: Secondary | ICD-10-CM | POA: Diagnosis not present

## 2015-08-11 HISTORY — DX: Gestational (pregnancy-induced) hypertension without significant proteinuria, unspecified trimester: O13.9

## 2015-08-11 LAB — TYPE AND SCREEN
ABO/RH(D): O POS
Antibody Screen: NEGATIVE

## 2015-08-11 LAB — CBC
HCT: 37.6 % (ref 36.0–46.0)
Hemoglobin: 12.7 g/dL (ref 12.0–15.0)
MCH: 29.1 pg (ref 26.0–34.0)
MCHC: 33.8 g/dL (ref 30.0–36.0)
MCV: 86 fL (ref 78.0–100.0)
Platelets: 158 10*3/uL (ref 150–400)
RBC: 4.37 MIL/uL (ref 3.87–5.11)
RDW: 12.9 % (ref 11.5–15.5)
WBC: 9.6 10*3/uL (ref 4.0–10.5)

## 2015-08-11 LAB — COMPREHENSIVE METABOLIC PANEL
ALT: 15 U/L (ref 14–54)
AST: 22 U/L (ref 15–41)
Albumin: 3.2 g/dL — ABNORMAL LOW (ref 3.5–5.0)
Alkaline Phosphatase: 143 U/L — ABNORMAL HIGH (ref 38–126)
Anion gap: 9 (ref 5–15)
BUN: 5 mg/dL — ABNORMAL LOW (ref 6–20)
CO2: 23 mmol/L (ref 22–32)
Calcium: 9.5 mg/dL (ref 8.9–10.3)
Chloride: 104 mmol/L (ref 101–111)
Creatinine, Ser: 0.57 mg/dL (ref 0.44–1.00)
GFR calc Af Amer: >60 mL/min (ref >60)
GFR calc non Af Amer: >60 mL/min (ref >60)
Glucose, Bld: 79 mg/dL (ref 65–99)
Potassium: 4.4 mmol/L (ref 3.5–5.1)
Sodium: 136 mmol/L (ref 135–145)
Total Bilirubin: 0.5 mg/dL (ref 0.3–1.2)
Total Protein: 6.8 g/dL (ref 6.5–8.1)

## 2015-08-11 LAB — ABO/RH: ABO/RH(D): O POS

## 2015-08-11 SURGERY — Surgical Case
Anesthesia: Spinal | Wound class: Clean Contaminated

## 2015-08-11 MED ORDER — COCONUT OIL OIL: 1.0000 "application " | TOPICAL_OIL | Status: DC | PRN

## 2015-08-11 MED ORDER — BUPIVACAINE HCL (PF) 0.25 % IJ SOLN
INTRAMUSCULAR | Status: DC | PRN
  Administered 2015-08-11: 20 mL

## 2015-08-11 MED ORDER — SIMETHICONE 80 MG PO CHEW
80.0000 mg | CHEWABLE_TABLET | Freq: Three times a day (TID) | ORAL | Status: DC
  Administered 2015-08-12 – 2015-08-13 (×4): 80 mg via ORAL
  Filled 2015-08-11 (×4): qty 1

## 2015-08-11 MED ORDER — LACTATED RINGERS IV SOLN
INTRAVENOUS | Status: DC | PRN
  Administered 2015-08-11: 17:00:00 via INTRAVENOUS

## 2015-08-11 MED ORDER — METHYLERGONOVINE MALEATE 0.2 MG PO TABS: 0.2000 mg | ORAL_TABLET | ORAL | Status: DC | PRN

## 2015-08-11 MED ORDER — BUPIVACAINE IN DEXTROSE 0.75-8.25 % IT SOLN
INTRATHECAL | Status: DC | PRN
  Administered 2015-08-11: 1.6 mL via INTRATHECAL

## 2015-08-11 MED ORDER — NALBUPHINE HCL 10 MG/ML IJ SOLN: 5.0000 mg | INTRAMUSCULAR | Status: DC | PRN

## 2015-08-11 MED ORDER — DIPHENHYDRAMINE HCL 50 MG/ML IJ SOLN: 12.5000 mg | INTRAMUSCULAR | Status: DC | PRN

## 2015-08-11 MED ORDER — MEPERIDINE HCL 25 MG/ML IJ SOLN: 6.2500 mg | INTRAMUSCULAR | Status: DC | PRN

## 2015-08-11 MED ORDER — ZOLPIDEM TARTRATE 5 MG PO TABS: 5.0000 mg | ORAL_TABLET | Freq: Every evening | ORAL | Status: DC | PRN

## 2015-08-11 MED ORDER — PHENYLEPHRINE 8 MG IN D5W 100 ML (0.08MG/ML) PREMIX OPTIME
INJECTION | INTRAVENOUS | Status: AC
  Filled 2015-08-11: qty 100

## 2015-08-11 MED ORDER — SCOPOLAMINE 1 MG/3DAYS TD PT72: 1.0000 | MEDICATED_PATCH | Freq: Once | TRANSDERMAL | Status: DC

## 2015-08-11 MED ORDER — DIPHENHYDRAMINE HCL 25 MG PO CAPS: 25.0000 mg | ORAL_CAPSULE | Freq: Four times a day (QID) | ORAL | Status: DC | PRN

## 2015-08-11 MED ORDER — KETOROLAC TROMETHAMINE 30 MG/ML IJ SOLN: 30.0000 mg | Freq: Four times a day (QID) | INTRAMUSCULAR | Status: AC | PRN

## 2015-08-11 MED ORDER — MORPHINE SULFATE (PF) 0.5 MG/ML IJ SOLN
INTRAMUSCULAR | Status: AC
  Filled 2015-08-11: qty 10

## 2015-08-11 MED ORDER — PROMETHAZINE HCL 25 MG/ML IJ SOLN: 6.2500 mg | INTRAMUSCULAR | Status: DC | PRN

## 2015-08-11 MED ORDER — FENTANYL CITRATE (PF) 100 MCG/2ML IJ SOLN
INTRAMUSCULAR | Status: DC | PRN
  Administered 2015-08-11: 20 ug via INTRAVENOUS

## 2015-08-11 MED ORDER — LACTATED RINGERS IV SOLN: INTRAVENOUS | Status: DC

## 2015-08-11 MED ORDER — NALBUPHINE HCL 10 MG/ML IJ SOLN
5.0000 mg | Freq: Once | INTRAMUSCULAR | Status: AC | PRN
  Administered 2015-08-11: 5 mg via SUBCUTANEOUS

## 2015-08-11 MED ORDER — DIPHENHYDRAMINE HCL 25 MG PO CAPS
25.0000 mg | ORAL_CAPSULE | ORAL | Status: DC | PRN
  Administered 2015-08-11: 25 mg via ORAL
  Filled 2015-08-11: qty 1

## 2015-08-11 MED ORDER — SODIUM CHLORIDE 0.9 % IR SOLN
Status: DC | PRN
  Administered 2015-08-11: 1000 mL

## 2015-08-11 MED ORDER — NALBUPHINE HCL 10 MG/ML IJ SOLN
5.0000 mg | INTRAMUSCULAR | Status: DC | PRN
  Administered 2015-08-12: 5 mg via INTRAVENOUS
  Filled 2015-08-11: qty 1

## 2015-08-11 MED ORDER — WITCH HAZEL-GLYCERIN EX PADS: 1.0000 "application " | MEDICATED_PAD | CUTANEOUS | Status: DC | PRN

## 2015-08-11 MED ORDER — OXYTOCIN 40 UNITS IN LACTATED RINGERS INFUSION - SIMPLE MED: 2.5000 [IU]/h | INTRAVENOUS | Status: AC

## 2015-08-11 MED ORDER — ONDANSETRON HCL 4 MG/2ML IJ SOLN
INTRAMUSCULAR | Status: AC
  Filled 2015-08-11: qty 2

## 2015-08-11 MED ORDER — LACTATED RINGERS IV SOLN
Freq: Once | INTRAVENOUS | Status: AC
  Administered 2015-08-11: 14:00:00 via INTRAVENOUS

## 2015-08-11 MED ORDER — OXYTOCIN 10 UNIT/ML IJ SOLN
INTRAMUSCULAR | Status: AC
  Filled 2015-08-11: qty 4

## 2015-08-11 MED ORDER — NALBUPHINE HCL 10 MG/ML IJ SOLN
INTRAMUSCULAR | Status: AC
  Filled 2015-08-11: qty 1

## 2015-08-11 MED ORDER — FENTANYL CITRATE (PF) 100 MCG/2ML IJ SOLN
INTRAMUSCULAR | Status: AC
  Filled 2015-08-11: qty 2

## 2015-08-11 MED ORDER — METHYLERGONOVINE MALEATE 0.2 MG/ML IJ SOLN: 0.2000 mg | INTRAMUSCULAR | Status: DC | PRN

## 2015-08-11 MED ORDER — KETOROLAC TROMETHAMINE 30 MG/ML IJ SOLN: 30.0000 mg | Freq: Once | INTRAMUSCULAR | Status: DC

## 2015-08-11 MED ORDER — SCOPOLAMINE 1 MG/3DAYS TD PT72
1.0000 | MEDICATED_PATCH | Freq: Once | TRANSDERMAL | Status: DC
  Administered 2015-08-11: 1.5 mg via TRANSDERMAL

## 2015-08-11 MED ORDER — ACETAMINOPHEN 500 MG PO TABS
1000.0000 mg | ORAL_TABLET | Freq: Four times a day (QID) | ORAL | Status: AC
  Administered 2015-08-11: 1000 mg via ORAL
  Filled 2015-08-11: qty 2

## 2015-08-11 MED ORDER — IBUPROFEN 600 MG PO TABS
600.0000 mg | ORAL_TABLET | Freq: Four times a day (QID) | ORAL | Status: DC
  Administered 2015-08-12 – 2015-08-13 (×6): 600 mg via ORAL
  Filled 2015-08-11 (×6): qty 1

## 2015-08-11 MED ORDER — LACTATED RINGERS IV SOLN
INTRAVENOUS | Status: DC
  Administered 2015-08-11: 1000 mL via INTRAVENOUS
  Administered 2015-08-11: 16:00:00 via INTRAVENOUS
  Administered 2015-08-11: 1000 mL via INTRAVENOUS

## 2015-08-11 MED ORDER — KETOROLAC TROMETHAMINE 30 MG/ML IJ SOLN
30.0000 mg | Freq: Four times a day (QID) | INTRAMUSCULAR | Status: AC | PRN
  Administered 2015-08-11: 30 mg via INTRAMUSCULAR

## 2015-08-11 MED ORDER — ACETAMINOPHEN 325 MG PO TABS: 650.0000 mg | ORAL_TABLET | ORAL | Status: DC | PRN

## 2015-08-11 MED ORDER — SCOPOLAMINE 1 MG/3DAYS TD PT72
MEDICATED_PATCH | TRANSDERMAL | Status: AC
  Administered 2015-08-11: 1.5 mg via TRANSDERMAL
  Filled 2015-08-11: qty 1

## 2015-08-11 MED ORDER — EPHEDRINE SULFATE 50 MG/ML IJ SOLN
INTRAMUSCULAR | Status: DC | PRN
  Administered 2015-08-11: 10 mg via INTRAVENOUS

## 2015-08-11 MED ORDER — SIMETHICONE 80 MG PO CHEW: 80.0000 mg | CHEWABLE_TABLET | ORAL | Status: DC | PRN

## 2015-08-11 MED ORDER — NALOXONE HCL 2 MG/2ML IJ SOSY: 1.0000 ug/kg/h | PREFILLED_SYRINGE | INTRAVENOUS | Status: DC | PRN

## 2015-08-11 MED ORDER — SIMETHICONE 80 MG PO CHEW
80.0000 mg | CHEWABLE_TABLET | ORAL | Status: DC
  Administered 2015-08-12 – 2015-08-13 (×2): 80 mg via ORAL
  Filled 2015-08-11 (×2): qty 1

## 2015-08-11 MED ORDER — TETANUS-DIPHTH-ACELL PERTUSSIS 5-2.5-18.5 LF-MCG/0.5 IM SUSP: 0.5000 mL | Freq: Once | INTRAMUSCULAR | Status: DC

## 2015-08-11 MED ORDER — MORPHINE SULFATE (PF) 0.5 MG/ML IJ SOLN
INTRAMUSCULAR | Status: DC | PRN
  Administered 2015-08-11: .2 mg via EPIDURAL

## 2015-08-11 MED ORDER — HYDROMORPHONE HCL 1 MG/ML IJ SOLN: 0.2500 mg | INTRAMUSCULAR | Status: DC | PRN

## 2015-08-11 MED ORDER — KETOROLAC TROMETHAMINE 30 MG/ML IJ SOLN
INTRAMUSCULAR | Status: AC
  Filled 2015-08-11: qty 1

## 2015-08-11 MED ORDER — PHENYLEPHRINE 40 MCG/ML (10ML) SYRINGE FOR IV PUSH (FOR BLOOD PRESSURE SUPPORT)
PREFILLED_SYRINGE | INTRAVENOUS | Status: AC
  Filled 2015-08-11: qty 10

## 2015-08-11 MED ORDER — IBUPROFEN 600 MG PO TABS: 600.0000 mg | ORAL_TABLET | Freq: Four times a day (QID) | ORAL | Status: DC | PRN

## 2015-08-11 MED ORDER — DIBUCAINE 1 % RE OINT: 1.0000 "application " | TOPICAL_OINTMENT | RECTAL | Status: DC | PRN

## 2015-08-11 MED ORDER — ONDANSETRON HCL 4 MG/2ML IJ SOLN
INTRAMUSCULAR | Status: DC | PRN
  Administered 2015-08-11: 4 mg via INTRAVENOUS

## 2015-08-11 MED ORDER — SODIUM CHLORIDE 0.9% FLUSH: 3.0000 mL | INTRAVENOUS | Status: DC | PRN

## 2015-08-11 MED ORDER — OXYTOCIN 40 UNITS IN LACTATED RINGERS INFUSION - SIMPLE MED
INTRAVENOUS | Status: DC | PRN
  Administered 2015-08-11: 40 [IU] via INTRAVENOUS

## 2015-08-11 MED ORDER — SENNOSIDES-DOCUSATE SODIUM 8.6-50 MG PO TABS
2.0000 | ORAL_TABLET | ORAL | Status: DC
  Administered 2015-08-12 – 2015-08-13 (×2): 2 via ORAL
  Filled 2015-08-11 (×2): qty 2

## 2015-08-11 MED ORDER — MENTHOL 3 MG MT LOZG: 1.0000 | LOZENGE | OROMUCOSAL | Status: DC | PRN

## 2015-08-11 MED ORDER — NALOXONE HCL 0.4 MG/ML IJ SOLN: 0.4000 mg | INTRAMUSCULAR | Status: DC | PRN

## 2015-08-11 MED ORDER — PHENYLEPHRINE HCL 10 MG/ML IJ SOLN
INTRAMUSCULAR | Status: DC | PRN
  Administered 2015-08-11: 40 ug via INTRAVENOUS
  Administered 2015-08-11: 80 ug via INTRAVENOUS

## 2015-08-11 MED ORDER — NALBUPHINE HCL 10 MG/ML IJ SOLN: 5.0000 mg | Freq: Once | INTRAMUSCULAR | Status: AC | PRN

## 2015-08-11 MED ORDER — BUPIVACAINE HCL (PF) 0.25 % IJ SOLN
INTRAMUSCULAR | Status: AC
  Filled 2015-08-11: qty 30

## 2015-08-11 MED ORDER — ONDANSETRON HCL 4 MG/2ML IJ SOLN
4.0000 mg | Freq: Three times a day (TID) | INTRAMUSCULAR | Status: DC | PRN
Start: 2015-08-11 — End: 2015-08-13

## 2015-08-11 MED ORDER — CEFAZOLIN SODIUM-DEXTROSE 2-4 GM/100ML-% IV SOLN: 2.0000 g | Freq: Once | INTRAVENOUS | Status: DC

## 2015-08-11 MED ORDER — PHENYLEPHRINE 8 MG IN D5W 100 ML (0.08MG/ML) PREMIX OPTIME
INJECTION | INTRAVENOUS | Status: DC | PRN
  Administered 2015-08-11: 60 ug/min via INTRAVENOUS

## 2015-08-11 MED ORDER — PRENATAL MULTIVITAMIN CH
1.0000 | ORAL_TABLET | Freq: Every day | ORAL | Status: DC
  Administered 2015-08-12 – 2015-08-13 (×2): 1 via ORAL
  Filled 2015-08-11 (×2): qty 1

## 2015-08-11 SURGICAL SUPPLY — 37 items
APL SKNCLS STERI-STRIP NONHPOA (GAUZE/BANDAGES/DRESSINGS) ×1
BENZOIN TINCTURE PRP APPL 2/3 (GAUZE/BANDAGES/DRESSINGS) ×1 IMPLANT
CLAMP CORD UMBIL (MISCELLANEOUS) IMPLANT
CLOTH BEACON ORANGE TIMEOUT ST (SAFETY) ×2 IMPLANT
CONTAINER PREFILL 10% NBF 15ML (MISCELLANEOUS) IMPLANT
DRSG OPSITE POSTOP 4X10 (GAUZE/BANDAGES/DRESSINGS) ×2 IMPLANT
DURAPREP 26ML APPLICATOR (WOUND CARE) ×2 IMPLANT
ELECT REM PT RETURN 9FT ADLT (ELECTROSURGICAL) ×2
ELECTRODE REM PT RTRN 9FT ADLT (ELECTROSURGICAL) ×1 IMPLANT
EXTRACTOR VACUUM M CUP 4 TUBE (SUCTIONS) IMPLANT
GLOVE BIO SURGEON STRL SZ7.5 (GLOVE) ×2 IMPLANT
GLOVE BIOGEL PI IND STRL 7.0 (GLOVE) ×1 IMPLANT
GLOVE BIOGEL PI INDICATOR 7.0 (GLOVE) ×1
GOWN STRL REUS W/TWL LRG LVL3 (GOWN DISPOSABLE) ×4 IMPLANT
KIT ABG SYR 3ML LUER SLIP (SYRINGE) IMPLANT
NDL HYPO 25X5/8 SAFETYGLIDE (NEEDLE) IMPLANT
NDL SPNL 20GX3.5 QUINCKE YW (NEEDLE) IMPLANT
NEEDLE HYPO 22GX1.5 SAFETY (NEEDLE) ×2 IMPLANT
NEEDLE HYPO 25X5/8 SAFETYGLIDE (NEEDLE) IMPLANT
NEEDLE SPNL 20GX3.5 QUINCKE YW (NEEDLE) IMPLANT
NS IRRIG 1000ML POUR BTL (IV SOLUTION) ×2 IMPLANT
PACK C SECTION WH (CUSTOM PROCEDURE TRAY) ×2 IMPLANT
PENCIL SMOKE EVAC W/HOLSTER (ELECTROSURGICAL) ×2 IMPLANT
STRIP CLOSURE SKIN 1/2X4 (GAUZE/BANDAGES/DRESSINGS) ×1 IMPLANT
SUT MNCRL 0 VIOLET CTX 36 (SUTURE) ×2 IMPLANT
SUT MNCRL AB 3-0 PS2 27 (SUTURE) IMPLANT
SUT MON AB 2-0 CT1 27 (SUTURE) ×2 IMPLANT
SUT MON AB-0 CT1 36 (SUTURE) ×4 IMPLANT
SUT MONOCRYL 0 CTX 36 (SUTURE) ×2
SUT PLAIN 0 NONE (SUTURE) IMPLANT
SUT PLAIN 2 0 (SUTURE)
SUT PLAIN 2 0 XLH (SUTURE) ×1 IMPLANT
SUT PLAIN ABS 2-0 CT1 27XMFL (SUTURE) IMPLANT
SYR 20CC LL (SYRINGE) IMPLANT
SYR CONTROL 10ML LL (SYRINGE) ×2 IMPLANT
TOWEL OR 17X24 6PK STRL BLUE (TOWEL DISPOSABLE) ×2 IMPLANT
TRAY FOLEY CATH SILVER 14FR (SET/KITS/TRAYS/PACK) ×2 IMPLANT

## 2015-08-11 NOTE — Anesthesia Postprocedure Evaluation (Signed)
Anesthesia Post Note  Patient: Tourist information centre manager  Procedure(s) Performed: Procedure(s) (LRB): Primary CESAREAN SECTION (N/A)  Patient location during evaluation: PACU Anesthesia Type: Spinal Level of consciousness: awake Pain management: pain level controlled Vital Signs Assessment: post-procedure vital signs reviewed and stable Respiratory status: spontaneous breathing Cardiovascular status: stable Postop Assessment: no headache, no backache, spinal receding, patient able to bend at knees and no signs of nausea or vomiting Anesthetic complications: no     Last Vitals:  Filed Vitals:   08/11/15 1829 08/11/15 1858  BP:  125/75  Pulse: 58 57  Temp:  36.4 C  Resp: 12 16    Last Pain:  Filed Vitals:   08/11/15 1901  PainSc: 1    Pain Goal: Patients Stated Pain Goal: 1 (08/11/15 1901)               Kimberlye Dilger JR,JOHN Mateo Flow

## 2015-08-11 NOTE — H&P (Signed)
Deanna Sims is a 40 y.o. female presenting for primary csection for EFW >4500gm and gestational HTN.  Maternal Medical History:  Contractions: Onset was less than 1 hour ago.   Frequency: rare.   Perceived severity is mild.    Fetal activity: Perceived fetal activity is normal.   Last perceived fetal movement was within the past hour.    Prenatal complications: PIH, polyhydramnios and thrombocytopenia.   Prenatal Complications - Diabetes: none.    OB History    Gravida Para Term Preterm AB TAB SAB Ectopic Multiple Living   2    1   1   0     Past Medical History  Diagnosis Date  . Insomnia   . Seasonal allergies   . Gestational hypertension    Past Surgical History  Procedure Laterality Date  . Wisdom tooth extraction    . Dilation and evacuation N/A 03/15/2014    Procedure: DILATATION AND EVACUATION;  Surgeon: Lyman Speller, MD;  Location: [facility];  Service: Gynecology;  Laterality: N/A;  . Laparoscopy N/A 03/15/2014    Procedure: LAPAROSCOPY OPERATIVE;  Surgeon: Lyman Speller, MD;  Location: [facility];  Service: Gynecology;  Laterality: N/A;   Family History: family history includes Cancer - Lung in her maternal grandfather and maternal grandmother; Cancer - Ovarian in her maternal grandmother; Uterine cancer (age of onset: 36) in her mother. Social History:  reports that she quit smoking about 1 years ago. Her smoking use included Cigarettes. She has a 13 pack-year smoking history. She has never used smokeless tobacco. She reports that she drinks about 3.0 oz of alcohol per week. She reports that she does not use illicit drugs.   Prenatal Transfer Tool  Maternal Diabetes: No Genetic Screening: Normal Maternal Ultrasounds/Referrals: Normal Fetal Ultrasounds or other Referrals:  None Maternal Substance Abuse:  No Significant Maternal Medications:  None Significant Maternal Lab Results:  None Other Comments:  None  Review of Systems  Constitutional: Negative.    All other systems reviewed and are negative.     Height 5\' 10"  (1.778 m), weight 108.863 kg (240 lb), last menstrual period 11/10/2014. Maternal Exam:  Uterine Assessment: Contraction strength is mild.  Contraction frequency is rare.   Abdomen: Patient reports no abdominal tenderness. Fetal presentation: vertex  Introitus: Normal vulva. Normal vagina.  Ferning test: not done.  Nitrazine test: not done. Amniotic fluid character: not assessed.  Pelvis: questionable for delivery.   Cervix: Cervix evaluated by digital exam.     Physical Exam  Nursing note and vitals reviewed. Constitutional: She is oriented to person, place, and time. She appears well-developed and well-nourished.  HENT:  Head: Normocephalic and atraumatic.  Neck: Normal range of motion. Neck supple.  Cardiovascular: Normal rate and regular rhythm.   Respiratory: Effort normal and breath sounds normal.  GI: Soft. Bowel sounds are normal.  Genitourinary: Vagina normal and uterus normal.  Musculoskeletal: Normal range of motion.  Neurological: She is alert and oriented to person, place, and time. She has normal reflexes.  Skin: Skin is warm and dry.  Psychiatric: She has a normal mood and affect.    Prenatal labs: ABO, Rh: O/Positive/-- (12/08 0000) Antibody: Negative (12/08 0000) Rubella: Immune (12/08 0000) RPR: Nonreactive (12/08 0000)  HBsAg: Negative (12/08 0000)  HIV: Non-reactive (12/08 0000)  GBS: Negative (06/01 0000)   Assessment/Plan: 39 wks IUP LGA- declines VD due to risks of shoulder dystocia Gestational HTN Primary csection. Inaccuracy of EFW discussed. Risks vs benefits of surgery discussed.  Consent done.   Luismario Coston J 08/11/2015, 1:47 PM

## 2015-08-11 NOTE — Transfer of Care (Signed)
Immediate Anesthesia Transfer of Care Note  Patient: Deanna Sims  Procedure(s) Performed: Procedure(s) with comments: Primary CESAREAN SECTION (N/A) - EDD: 08/17/15 Allergy: PCN  Patient Location: PACU  Anesthesia Type:Spinal  Level of Consciousness: awake, alert  and oriented  Airway & Oxygen Therapy: Patient Spontanous Breathing  Post-op Assessment: Report given to RN and Post -op Vital signs reviewed and stable  Post vital signs: Reviewed and stable  Last Vitals:  Filed Vitals:   08/11/15 1413  BP: 129/90  Pulse: 72  Temp: 36.6 C  Resp: 18    Last Pain: There were no vitals filed for this visit.    Patients Stated Pain Goal: 3 ([id number])  Complications: No apparent anesthesia complications

## 2015-08-11 NOTE — Anesthesia Preprocedure Evaluation (Addendum)
Anesthesia Evaluation  Patient identified by MRN, date of birth, ID band Patient awake    Reviewed: Allergy & Precautions, NPO status , Patient's Chart, lab work & pertinent test results  Airway Mallampati: I       Dental no notable dental hx.    Pulmonary former smoker,    Pulmonary exam normal - rhonchi       Cardiovascular hypertension, Normal cardiovascular exam     Neuro/Psych    GI/Hepatic   Endo/Other    Renal/GU      Musculoskeletal   Abdominal (+) + obese,   Peds  Hematology   Anesthesia Other Findings   Reproductive/Obstetrics                            Anesthesia Physical Anesthesia Plan  ASA: II  Anesthesia Plan: Spinal   Post-op Pain Management:    Induction:   Airway Management Planned:   Additional Equipment:   Intra-op Plan:   Post-operative Plan:   Informed Consent: I have reviewed the patients History and Physical, chart, labs and discussed the procedure including the risks, benefits and alternatives for the proposed anesthesia with the patient or authorized representative who has indicated his/her understanding and acceptance.     Plan Discussed with: CRNA and Surgeon  Anesthesia Plan Comments:        Anesthesia Quick Evaluation

## 2015-08-11 NOTE — Progress Notes (Signed)
Patient ID: Deanna Sims, female   DOB: May 26, 1975, 40 y.o.   MRN: [id number] Patient seen and examined. Consent witnessed and signed. No changes noted. Update completed.

## 2015-08-11 NOTE — Anesthesia Procedure Notes (Signed)
Spinal Patient location during procedure: OR Start time: 08/11/2015 4:15 PM End time: 08/11/2015 4:16 PM Staffing Anesthesiologist: Lyn Hollingshead Performed by: anesthesiologist  Preanesthetic Checklist Completed: patient identified, surgical consent, pre-op evaluation, timeout performed, IV checked, risks and benefits discussed and monitors and equipment checked Spinal Block Patient position: sitting Prep: site prepped and draped and DuraPrep Patient monitoring: heart rate, cardiac monitor, continuous pulse ox and blood pressure Approach: midline Location: L3-4 Injection technique: single-shot Needle Needle type: Sprotte  Needle gauge: 24 G Needle length: 9 cm Needle insertion depth: 7 cm Assessment Sensory level: T4

## 2015-08-11 NOTE — Anesthesia Postprocedure Evaluation (Signed)
Anesthesia Post Note  Patient: Tourist information centre manager  Procedure(s) Performed: Procedure(s) (LRB): Primary CESAREAN SECTION (N/A)  Patient location during evaluation: PACU Anesthesia Type: Spinal Level of consciousness: awake and alert, oriented and patient cooperative Pain management: pain level controlled Vital Signs Assessment: post-procedure vital signs reviewed and stable Respiratory status: spontaneous breathing, nonlabored ventilation and respiratory function stable Cardiovascular status: blood pressure returned to baseline and stable Postop Assessment: no signs of nausea or vomiting and spinal receding Anesthetic complications: no     Last Vitals:  Filed Vitals:   08/11/15 1733 08/11/15 1734  BP:    Pulse: 84 76  Temp:    Resp: 21 17    Last Pain: There were no vitals filed for this visit. Pain Goal: Patients Stated Pain Goal: 3 (08/11/15 1413)               Seleta Rhymes. Dasha Kawabata

## 2015-08-11 NOTE — Op Note (Signed)
Cesarean Section Procedure Note  Indications: macrosomia, patient declines vag del attempt and gestational htn  Pre-operative Diagnosis: 39 week 1 day pregnancy.  Post-operative Diagnosis: same  Surgeon: Lovenia Kim   Assistants: Cora Collum , FACM  Anesthesia: Local anesthesia 0.25.% bupivacaine and Spinal anesthesia  ASA Class: 2  Procedure Details  The patient was seen in the Holding Room. The risks, benefits, complications, treatment options, and expected outcomes were discussed with the patient.  The patient concurred with the proposed plan, giving informed consent. The risks of anesthesia, infection, bleeding and possible injury to other organs discussed. Injury to bowel, bladder, or ureter with possible need for repair discussed. Possible need for transfusion with secondary risks of hepatitis or HIV acquisition discussed. Post operative complications to include but not limited to DVT, PE and Pneumonia noted. The site of surgery properly noted/marked. The patient was taken to Operating Room # 1, identified as Tourist information centre manager and the procedure verified as C-Section Delivery. A Time Out was held and the above information confirmed.  After induction of anesthesia, the patient was draped and prepped in the usual sterile manner. A Pfannenstiel incision was made and carried down through the subcutaneous tissue to the fascia. Fascial incision was made and extended transversely using Mayo scissors. The fascia was separated from the underlying rectus tissue superiorly and inferiorly. The peritoneum was identified and entered. Peritoneal incision was extended longitudinally. The utero-vesical peritoneal reflection was incised transversely and the bladder flap was bluntly freed from the lower uterine segment. A low transverse uterine incision(Kerr hysterotomy) was made. Delivered from OA presentation was a  female with Apgar scores of 8 at one minute and 9 at five minutes. Bulb suctioning gently  performed. Neonatal team in attendance.After the umbilical cord was clamped and cut cord blood was obtained for evaluation. The placenta was removed intact and appeared normal. The uterus was curetted with a dry lap pack. Good hemostasis was noted.The uterine outline, tubes and ovaries appeared normal. The uterine incision was closed with running locked sutures of 0 Monocryl x 2 layers. Hemostasis was observed. Lavage was carried out until clear.The parietal peritoneum was closed with a running 2-0 Monocryl suture. The fascia was then reapproximated with running sutures of 0 Monocryl. The skin was reapproximated with 3-0 monocryl after Jakin closure with 2-0 plain.  Instrument, sponge, and needle counts were correct prior the abdominal closure and at the conclusion of the case.   Findings: FTLM, Post placenta. 2-3 cm SM fibroid, post wall  Estimated Blood Loss:  500         Drains: foley                 Specimens: placenta                 Complications:  None; patient tolerated the procedure well.         Disposition: PACU - hemodynamically stable.         Condition: stable  Attending Attestation: I performed the procedure.

## 2015-08-12 ENCOUNTER — Encounter (HOSPITAL_COMMUNITY): Payer: Self-pay | Admitting: Obstetrics and Gynecology

## 2015-08-12 LAB — BIRTH TISSUE RECOVERY COLLECTION (PLACENTA DONATION)

## 2015-08-12 LAB — CBC
HCT: 32 % — ABNORMAL LOW (ref 36.0–46.0)
Hemoglobin: 10.7 g/dL — ABNORMAL LOW (ref 12.0–15.0)
MCH: 29 pg (ref 26.0–34.0)
MCHC: 33.4 g/dL (ref 30.0–36.0)
MCV: 86.7 fL (ref 78.0–100.0)
Platelets: 108 10*3/uL — ABNORMAL LOW (ref 150–400)
RBC: 3.69 MIL/uL — ABNORMAL LOW (ref 3.87–5.11)
RDW: 13.2 % (ref 11.5–15.5)
WBC: 8.9 10*3/uL (ref 4.0–10.5)

## 2015-08-12 LAB — RPR: RPR Ser Ql: NONREACTIVE

## 2015-08-12 MED ORDER — OXYCODONE HCL 5 MG PO TABS: 5.0000 mg | ORAL_TABLET | ORAL | Status: DC | PRN

## 2015-08-12 MED ORDER — OXYCODONE-ACETAMINOPHEN 5-325 MG PO TABS
2.0000 | ORAL_TABLET | ORAL | Status: DC | PRN
Start: 2015-08-12 — End: 2015-08-13
  Administered 2015-08-12 – 2015-08-13 (×8): 2 via ORAL
  Filled 2015-08-12 (×8): qty 2

## 2015-08-12 NOTE — Progress Notes (Signed)
Entered room to help assist the patient out of bed into the chair as we discussed earlier in the night. She states that she has not slept all day and just wants to nap before she gets up. I discussed the importance of getting out of bed and that she should have gotten up earlier but due to her pain control, she was unable to. She states she understands the risks of waiting to ambulate but she wants to sleep for 1 hour before she gets out of bed.

## 2015-08-12 NOTE — Progress Notes (Signed)
Patient ambulated out of bed to the chair with difficulty, pain level is a 9/10. Pain medication not ordered but patient wanted to get out of bed and take the pain medication afterwards. Since she had such difficulty moving around due to pain, her catheter will remain in until she receives relief from the pain medication.

## 2015-08-12 NOTE — Progress Notes (Signed)
Patient ID: Deanna Sims, female   DOB: Oct 13, 1975, 40 y.o.   MRN: [id number] Subjective: S/P Primary Cesarean Delivery for Suspected Macrosomia POD# 1 Information for the patient's newborn:  Deanna, Sims [id number]  female  / circ planning  Reports feeling well. Feeding: breast Patient reports tolerating PO.  Breast symptoms: none Pain controlled with ibuprofen (OTC) and narcotic analgesics including Percocet Denies HA/SOB/C/P/N/V/dizziness. Flatus absent. No BM. She reports vaginal bleeding as normal, without clots.  She is ambulating, urinating without difficult.     Objective:   VS:  Filed Vitals:   08/11/15 2009 08/11/15 2111 08/11/15 2255 08/12/15 0245  BP: 137/60 117/70 113/64 114/68  Pulse: 72 74 81 84  Temp: 97.3 F (36.3 C) 97.4 F (36.3 C) 97.5 F (36.4 C) 97.6 F (36.4 C)  TempSrc: Oral Oral Oral Oral  Resp: 16 16 16 18   Height:      Weight:      SpO2: 98% 95% 96% 96%     Intake/Output Summary (Last 24 hours) at 08/12/15 1009 Last data filed at 08/12/15 0645  Gross per 24 hour  Intake   2630 ml  Output   2950 ml  Net   -320 ml        Recent Labs  08/11/15 1410 08/12/15 0617  WBC 9.6 8.9  HGB 12.7 10.7*  HCT 37.6 32.0*  PLT 158 108*     Blood type: O POS (06/29 1410)  Rubella: Immune (12/08 0000)     Physical Exam:   General: alert, cooperative, fatigued and no distress  CV: Regular rate and rhythm, S1S2 present or without murmur or extra heart sounds  Resp: clear  Abdomen: soft, nontender, normal bowel sounds  Incision: clean, dry and intact, skin well-approximated with sutures  Uterine Fundus: firm, 1 FB below umbilicus, nontender  Lochia: minimal  Ext: edema trace and Homans sign is negative, no sign of DVT   Assessment/Plan: 40 y.o.   POD# 1.  S/P Cesarean Delivery.  Indications: elective for suspected macrosomia                Principal Problem:   Postpartum care following repeat cesarean delivery (6/29) Active Problems:  LGA (large for gestational age) fetus  Doing well, stable.               Regular diet as tolerated D/C IV per unit protocol Ambulate Routine post-op care  Graceann Congress, MSN, CNM 08/12/2015, 10:09 AM

## 2015-08-12 NOTE — Anesthesia Postprocedure Evaluation (Signed)
Anesthesia Post Note  Patient: Tourist information centre manager  Procedure(s) Performed: Procedure(s) (LRB): Primary CESAREAN SECTION (N/A)  Patient location during evaluation: Mother Baby Anesthesia Type: Spinal Level of consciousness: awake Pain management: pain level controlled Vital Signs Assessment: post-procedure vital signs reviewed and stable Respiratory status: spontaneous breathing Cardiovascular status: stable Postop Assessment: no backache, no headache, spinal receding and patient able to bend at knees Anesthetic complications: no     Last Vitals:  Filed Vitals:   08/11/15 2255 08/12/15 0245  BP: 113/64 114/68  Pulse: 81 84  Temp: 36.4 C 36.4 C  Resp: 16 18    Last Pain:  Filed Vitals:   08/12/15 0809  PainSc: 9    Pain Goal: Patients Stated Pain Goal: 3 (08/12/15 0645)               Everette Rank

## 2015-08-12 NOTE — Lactation Note (Signed)
This note was copied from a baby's chart. Lactation Consultation Note Initial visit at 71 hours of age.  Mom reports using NS with feedings and having pain with latch.  Baby had recent feeding about 1 hour ago and is fussy.  LC offered to assist with latching.  LC discussed cluster feedings.  Mom is reluctant,but due to nipple pain with latching agrees.  Mom reports not knowing she shouldn't hurt during latching.  Mom unsure why she is using NS and then report difficulty maintaining latch.  Mom was not applying NS properly with poor fit.  LC instructed with teachback and better fit.  [place] assisted with cross cradle hold on left breast.  Mom is needing a lot of assist with positioning and basics of breastfeeding.  Baby prefers shallow narrow gape when latching.  Mom to "sandwich" breast to get a deeper latch.  Baby has strong rhythmic sucking with a few swallows noted, but no colostrum noted in NS during entire feeding.  Baby more relaxed after feeding.  FOB at bedside supportive.  Report given to RN to set up DEBP for mom and LC discussed pumping plan with mom to post pump after feedings to establish a good milk supply with NS use.  [facility] LC resources given and discussed.  Encouraged to feed with early cues on demand.  Early newborn behavior discussed.  Hand expression demonstrated with colostrum visible.  Mom to call for assist as needed.      Patient Name: Deanna Sims Today's Date: 08/12/2015 Reason for consult: Initial assessment;Breast/nipple pain;Difficult latch   Maternal Data Has patient been taught Hand Expression?: Yes Does the patient have breastfeeding experience prior to this delivery?: No  Feeding Feeding Type: Breast Fed Length of feed:  (15 minutes observed)  LATCH Score/Interventions Latch: Repeated attempts needed to sustain latch, nipple held in mouth throughout feeding, stimulation needed to elicit sucking reflex. Intervention(s): Adjust position;Assist with latch;Breast  massage;Breast compression  Audible Swallowing: A few with stimulation (3 swallows during 15 minutes feeding no colostrum in NS)  Type of Nipple: Flat  Comfort (Breast/Nipple): Filling, red/small blisters or bruises, mild/mod discomfort  Problem noted: Mild/Moderate discomfort Interventions (Mild/moderate discomfort): Hand expression  Hold (Positioning): Assistance needed to correctly position infant at breast and maintain latch. Intervention(s): Breastfeeding basics reviewed;Support Pillows;Position options;Skin to skin  LATCH Score: 5  Lactation Tools Discussed/Used Tools: Nipple Shields Nipple shield size: 20 WIC Program: No   Consult Status Consult Status: Follow-up Date: 08/13/15    Justice Britain 08/12/2015, 8:17 PM

## 2015-08-12 NOTE — Addendum Note (Signed)
Addendum  created 08/12/15 0941 by Georgeanne Nim, CRNA   Modules edited: Clinical Notes   Clinical Notes:  File: [id number]

## 2015-08-13 MED ORDER — SIMETHICONE 80 MG PO CHEW: 80.0000 mg | CHEWABLE_TABLET | Freq: Three times a day (TID) | ORAL | Status: DC

## 2015-08-13 MED ORDER — SENNOSIDES-DOCUSATE SODIUM 8.6-50 MG PO TABS: 2.0000 | ORAL_TABLET | ORAL | Status: DC

## 2015-08-13 MED ORDER — OXYCODONE-ACETAMINOPHEN 5-325 MG PO TABS: 2.0000 | ORAL_TABLET | ORAL | Status: DC | PRN

## 2015-08-13 MED ORDER — IBUPROFEN 600 MG PO TABS: 600.0000 mg | ORAL_TABLET | Freq: Four times a day (QID) | ORAL | Status: DC | PRN

## 2015-08-13 MED ORDER — ACETAMINOPHEN 325 MG PO TABS: 650.0000 mg | ORAL_TABLET | ORAL | Status: DC | PRN

## 2015-08-13 MED ORDER — COCONUT OIL OIL: 1.0000 "application " | TOPICAL_OIL | Status: DC | PRN

## 2015-08-13 NOTE — Lactation Note (Signed)
This note was copied from a baby's chart. Lactation Consultation Note; Mom using NS and dad supplementing with formula after nursing with curved tip syringe. Baby fed for 25 min total on both breasts and still acting hungry. Using NS- would only take a few sucks at the bare breast. Mom reports pinching untucked bottom lip and mom states that feels better.. Dad able to untuck bottom lip. Encouraged to change positions- is using cradle hold all the time. Assisted with football hold and mom reports that feels comfortable. Dad very helpful. Offered feeding tube and syringe to supplement while baby is at the breast and parents agreeable. Baby took 12 cc's, still acting hungry. Took about 10 more and off to sleep. Reviewed set up, use and cleaning with parents. Mom has Medela pump at home. Encouraged continue pumping to promote milk supply. No questions at present, Reviewed OP appointments as resource after DC. To call prn  Patient Name: Deanna Sims [id number] Date: 08/13/2015 Reason for consult: Follow-up assessment   Maternal Data Formula Feeding for Exclusion: No Has patient been taught Hand Expression?: Yes Does the patient have breastfeeding experience prior to this delivery?: No  Feeding Feeding Type: Breast Fed Length of feed: 45 min  LATCH Score/Interventions Latch: Grasps breast easily, tongue down, lips flanged, rhythmical sucking.  Audible Swallowing: A few with stimulation  Type of Nipple: Everted at rest and after stimulation  Comfort (Breast/Nipple): Filling, red/small blisters or bruises, mild/mod discomfort  Problem noted: Mild/Moderate discomfort  Hold (Positioning): Assistance needed to correctly position infant at breast and maintain latch. Intervention(s): Breastfeeding basics reviewed  LATCH Score: 7  Lactation Tools Discussed/Used Tools: 67F feeding tube / Syringe Nipple shield size: 24 Breast pump type: Double-Electric Breast Pump WIC Program: No   Consult  Status Consult Status: Complete    Truddie Crumble 08/13/2015, 12:18 PM

## 2015-08-13 NOTE — Discharge Summary (Signed)
Obstetric Discharge Summary Reason for Admission: cesarean section Prenatal Procedures: ultrasound Intrapartum Procedures: cesarean: low cervical, transverse Postpartum Procedures: none Complications-Operative and Postpartum: none HEMOGLOBIN  Date Value Ref Range Status  08/12/2015 10.7* 12.0 - 15.0 g/dL Final   HEMOGLOBIN, FINGERSTICK  Date Value Ref Range Status  09/15/2013 13.6 12.0 - 16.0 g/dL Final   HCT  Date Value Ref Range Status  08/12/2015 32.0* 36.0 - 46.0 % Final    Physical Exam:  General: alert, cooperative and no distress Lochia: appropriate Uterine Fundus: firm Incision: healing well, no significant drainage DVT Evaluation: No evidence of DVT seen on physical exam.  Discharge Diagnoses: Post-op day 2, stable  Discharge Information: Date: 08/13/2015 Activity: pelvic rest Diet: routine Medications: PNV, Ibuprofen, Colace and Percocet Condition: stable Instructions: refer to practice specific booklet Discharge to: home Follow-up Information    Follow up with Lovenia Kim, MD. Schedule an appointment as soon as possible for a visit in 6 weeks.   Specialty:  Obstetrics and Gynecology   Why:  Postpartum visit   Contact information:   [name] [address] [phone]       Newborn Data: Live born female  Birth Weight: 8 lb 14.9 oz (4050 g) APGAR: , 9  Home with mother.  Deanna Sims 08/13/2015, 11:43 AM

## 2015-08-13 NOTE — Progress Notes (Signed)
Patient ID: Deanna Sims, female   DOB: 14-Nov-1975, 40 y.o.   MRN: [id number] Subjective: POD# 2 Information for the patient's newborn:  Keyshawna, Warmath [id number]  female  / circ completed  Reports feeling tired, sleepy, baby up during night for feeds. Desires DC home today. Feeding: breast Patient reports tolerating PO.  Breast symptoms: no discomfort Pain controlled with NSAID and Percocet Denies HA/SOB/C/P/N/V/dizziness. Flatus present, no BM. She reports vaginal bleeding as normal, without clots.  She is ambulating, urinating without difficult.     Objective:   VS:  Filed Vitals:   08/12/15 0245 08/12/15 1145 08/12/15 1600 08/13/15 0553  BP: 114/68 108/67 129/64 113/56  Pulse: 84 80 86 63  Temp: 97.6 F (36.4 C) 97.4 F (36.3 C) 97.8 F (36.6 C) 97.5 F (36.4 C)  TempSrc: Oral Oral Oral   Resp: 18 16 16 18   Height:      Weight:      SpO2: 96% 98% 96%      Intake/Output Summary (Last 24 hours) at 08/13/15 1131 Last data filed at 08/12/15 1600  Gross per 24 hour  Intake      0 ml  Output   1100 ml  Net  -1100 ml        Recent Labs  08/11/15 1410 08/12/15 0617  WBC 9.6 8.9  HGB 12.7 10.7*  HCT 37.6 32.0*  PLT 158 108*     Blood type: --/--/O POS, O POS (06/29 1410)  Rubella: Immune (12/08 0000)     Physical Exam:  General: alert, cooperative and no distress CV: Regular rate and rhythm Resp: clear Abdomen: NT, + BS, mild gas dist. Incision: clean, dry and intact Uterine Fundus: firm, below umbilicus, nontender Lochia: minimal Ext: trace edema, no redness or tenderness in the calves or thighs      Assessment/Plan: 40 y.o.   POD# 2. [id number]                  Principal Problem:   Postpartum care following repeat cesarean delivery (6/29)  Doing well, stable.    DC home w/ instructions   Depression, stable on SSRI - resume Zoloft  Juliene Pina 08/13/2015, 11:31 AM

## 2015-08-13 NOTE — Discharge Instructions (Signed)
Breast Pumping Tips If you are breastfeeding, there may be times when you cannot feed your baby directly. Returning to work or going on a trip are examples. Pumping allows you to store breast milk and feed it to your baby later.  You may not get much milk when you first start to pump. Your breasts should start to make more after a few days. If you pump at the times you usually feed your baby, you may be able to keep making enough milk to feed your baby without also using formula. The more often you pump, the more milk your body will make. WHEN SHOULD I PUMP?   You can start to pump soon after you have your baby. Ask your doctor what is right for you and your baby.  If you are going back to work, start pumping a few weeks before. This gives you time to learn how to pump and to store a supply of milk.  When you are with your baby, feed your baby when he or she is hungry. Pump after each feeding.  When you are away from your baby for many hours, pump for about 15 minutes every 2-3 hours. Pump both breasts at the same time if you can.  If your baby has a formula feeding, make sure to pump close to the same time.  If you drink any alcohol, wait 2 hours before pumping. HOW DO I GET READY TO PUMP? Your let-down reflex is your body's natural reaction that makes your breast milk flow. It is easier to make your breast milk flow when you are relaxed. Try these things to help you relax:  Smell one of your baby's blankets or an item of clothing.  Look at a picture or video of your baby.  Sit in a quiet, private space.  Massage the breast you plan to pump.  Place soothing warmth on the breast.  Play relaxing music. WHAT ARE SOME BREAST PUMPING TIPS?  Wash your hands before you pump. You do not need to wash your nipples or breasts.  There are three ways to pump. You can:  Use your hand to massage and squeeze your breast.  Use a handheld manual pump.  Use an electric pump.  Make sure the  suction cup on the breast pump is the right size. Place the suction cup directly over the nipple. It can be painful or hurt your nipple if it is the wrong size or placed wrong.  Put a small amount of purified or modified lanolin on your nipple and areola if you are sore.  If you are using an electric pump, change the speed and suction power to be more comfortable.  You may need a different type of pump if pumping hurts or you do not get a lot of milk. Your doctor can help you pick what type of pump to use.  Keep a full water bottle near you always. Drinking lots of fluid helps you make more milk.  You can store your milk to use later. Pumped breast milk can be stored in a sealable, sterile container or plastic bag. Always put the date you pumped it on the container.  Milk can stay out at room temperature for up to 8 hours.  You can store your milk in the refrigerator for up to 8 days.  You can store your milk in the freezer for 3 months. Thaw frozen milk using warm water. Do not put it in the microwave.  Do not smoke.  Ask your doctor for help. WHEN SHOULD I CALL MY DOCTOR?  You have a hard time pumping.  You are worried you do not make enough milk.  You have nipple pain, soreness, or redness.  You want to take birth control pills.   This information is not intended to replace advice given to you by your health care provider. Make sure you discuss any questions you have with your health care provider.   Document Released: 07/18/2007 Document Revised: 02/03/2013 Document Reviewed: 11/21/2012 Elsevier Interactive Patient Education [employer].

## 2015-09-16 DIAGNOSIS — B354 Tinea corporis: Secondary | ICD-10-CM | POA: Diagnosis not present

## 2015-10-04 DIAGNOSIS — B354 Tinea corporis: Secondary | ICD-10-CM | POA: Diagnosis not present

## 2015-10-12 DIAGNOSIS — Z1151 Encounter for screening for human papillomavirus (HPV): Secondary | ICD-10-CM | POA: Diagnosis not present

## 2015-10-13 DIAGNOSIS — L500 Allergic urticaria: Secondary | ICD-10-CM | POA: Diagnosis not present

## 2015-10-13 DIAGNOSIS — R21 Rash and other nonspecific skin eruption: Secondary | ICD-10-CM | POA: Diagnosis not present

## 2015-10-21 DIAGNOSIS — M5387 Other specified dorsopathies, lumbosacral region: Secondary | ICD-10-CM | POA: Diagnosis not present

## 2015-10-21 DIAGNOSIS — M9903 Segmental and somatic dysfunction of lumbar region: Secondary | ICD-10-CM | POA: Diagnosis not present

## 2015-10-21 DIAGNOSIS — M7062 Trochanteric bursitis, left hip: Secondary | ICD-10-CM | POA: Diagnosis not present

## 2015-10-21 DIAGNOSIS — M7061 Trochanteric bursitis, right hip: Secondary | ICD-10-CM | POA: Diagnosis not present

## 2015-12-01 DIAGNOSIS — L500 Allergic urticaria: Secondary | ICD-10-CM | POA: Diagnosis not present

## 2015-12-26 DIAGNOSIS — Z23 Encounter for immunization: Secondary | ICD-10-CM | POA: Diagnosis not present

## 2015-12-26 DIAGNOSIS — F5101 Primary insomnia: Secondary | ICD-10-CM | POA: Diagnosis not present

## 2015-12-26 DIAGNOSIS — F900 Attention-deficit hyperactivity disorder, predominantly inattentive type: Secondary | ICD-10-CM | POA: Diagnosis not present

## 2015-12-26 DIAGNOSIS — M25559 Pain in unspecified hip: Secondary | ICD-10-CM | POA: Diagnosis not present

## 2016-01-26 DIAGNOSIS — R21 Rash and other nonspecific skin eruption: Secondary | ICD-10-CM | POA: Diagnosis not present

## 2016-03-27 DIAGNOSIS — D259 Leiomyoma of uterus, unspecified: Secondary | ICD-10-CM | POA: Diagnosis not present

## 2016-03-27 DIAGNOSIS — R1032 Left lower quadrant pain: Secondary | ICD-10-CM | POA: Diagnosis not present

## 2016-04-03 DIAGNOSIS — M545 Low back pain: Secondary | ICD-10-CM | POA: Diagnosis not present

## 2016-04-03 DIAGNOSIS — M25552 Pain in left hip: Secondary | ICD-10-CM | POA: Diagnosis not present

## 2016-04-30 DIAGNOSIS — M25552 Pain in left hip: Secondary | ICD-10-CM | POA: Diagnosis not present

## 2016-04-30 DIAGNOSIS — M5442 Lumbago with sciatica, left side: Secondary | ICD-10-CM | POA: Diagnosis not present

## 2016-04-30 DIAGNOSIS — M545 Low back pain: Secondary | ICD-10-CM | POA: Diagnosis not present

## 2016-06-11 DIAGNOSIS — M5387 Other specified dorsopathies, lumbosacral region: Secondary | ICD-10-CM | POA: Diagnosis not present

## 2016-06-11 DIAGNOSIS — M9902 Segmental and somatic dysfunction of thoracic region: Secondary | ICD-10-CM | POA: Diagnosis not present

## 2016-06-11 DIAGNOSIS — M531 Cervicobrachial syndrome: Secondary | ICD-10-CM | POA: Diagnosis not present

## 2016-07-10 DIAGNOSIS — F900 Attention-deficit hyperactivity disorder, predominantly inattentive type: Secondary | ICD-10-CM | POA: Diagnosis not present

## 2016-07-10 DIAGNOSIS — F17210 Nicotine dependence, cigarettes, uncomplicated: Secondary | ICD-10-CM | POA: Diagnosis not present

## 2016-07-10 DIAGNOSIS — G4700 Insomnia, unspecified: Secondary | ICD-10-CM | POA: Diagnosis not present

## 2016-07-10 DIAGNOSIS — R5383 Other fatigue: Secondary | ICD-10-CM | POA: Diagnosis not present

## 2016-08-07 DIAGNOSIS — M9902 Segmental and somatic dysfunction of thoracic region: Secondary | ICD-10-CM | POA: Diagnosis not present

## 2016-08-07 DIAGNOSIS — M531 Cervicobrachial syndrome: Secondary | ICD-10-CM | POA: Diagnosis not present

## 2016-08-07 DIAGNOSIS — M5387 Other specified dorsopathies, lumbosacral region: Secondary | ICD-10-CM | POA: Diagnosis not present

## 2016-09-06 DIAGNOSIS — R635 Abnormal weight gain: Secondary | ICD-10-CM | POA: Diagnosis not present

## 2016-09-06 DIAGNOSIS — R5383 Other fatigue: Secondary | ICD-10-CM | POA: Diagnosis not present

## 2016-09-06 DIAGNOSIS — E039 Hypothyroidism, unspecified: Secondary | ICD-10-CM | POA: Diagnosis not present

## 2016-10-09 DIAGNOSIS — E288 Other ovarian dysfunction: Secondary | ICD-10-CM | POA: Diagnosis not present

## 2016-10-09 DIAGNOSIS — Z3161 Procreative counseling and advice using natural family planning: Secondary | ICD-10-CM | POA: Diagnosis not present

## 2016-10-09 DIAGNOSIS — Z319 Encounter for procreative management, unspecified: Secondary | ICD-10-CM | POA: Diagnosis not present

## 2016-10-11 DIAGNOSIS — Z1231 Encounter for screening mammogram for malignant neoplasm of breast: Secondary | ICD-10-CM | POA: Diagnosis not present

## 2016-10-11 DIAGNOSIS — R875 Abnormal microbiological findings in specimens from female genital organs: Secondary | ICD-10-CM | POA: Diagnosis not present

## 2016-10-11 DIAGNOSIS — Z6833 Body mass index (BMI) 33.0-33.9, adult: Secondary | ICD-10-CM | POA: Diagnosis not present

## 2016-10-11 DIAGNOSIS — Z01419 Encounter for gynecological examination (general) (routine) without abnormal findings: Secondary | ICD-10-CM | POA: Diagnosis not present

## 2016-10-25 DIAGNOSIS — G4700 Insomnia, unspecified: Secondary | ICD-10-CM | POA: Diagnosis not present

## 2016-10-25 DIAGNOSIS — J019 Acute sinusitis, unspecified: Secondary | ICD-10-CM | POA: Diagnosis not present

## 2016-10-25 DIAGNOSIS — F900 Attention-deficit hyperactivity disorder, predominantly inattentive type: Secondary | ICD-10-CM | POA: Diagnosis not present

## 2016-10-29 DIAGNOSIS — Z3183 Encounter for assisted reproductive fertility procedure cycle: Secondary | ICD-10-CM | POA: Diagnosis not present

## 2016-10-29 DIAGNOSIS — Z3141 Encounter for fertility testing: Secondary | ICD-10-CM | POA: Diagnosis not present

## 2016-10-31 DIAGNOSIS — Z3189 Encounter for other procreative management: Secondary | ICD-10-CM | POA: Diagnosis not present

## 2016-11-07 DIAGNOSIS — M9901 Segmental and somatic dysfunction of cervical region: Secondary | ICD-10-CM | POA: Diagnosis not present

## 2016-11-07 DIAGNOSIS — M5387 Other specified dorsopathies, lumbosacral region: Secondary | ICD-10-CM | POA: Diagnosis not present

## 2016-11-07 DIAGNOSIS — M531 Cervicobrachial syndrome: Secondary | ICD-10-CM | POA: Diagnosis not present

## 2016-11-26 DIAGNOSIS — Z3189 Encounter for other procreative management: Secondary | ICD-10-CM | POA: Diagnosis not present

## 2016-11-27 DIAGNOSIS — Z3189 Encounter for other procreative management: Secondary | ICD-10-CM | POA: Diagnosis not present

## 2016-12-18 DIAGNOSIS — Z3200 Encounter for pregnancy test, result unknown: Secondary | ICD-10-CM | POA: Diagnosis not present

## 2016-12-24 DIAGNOSIS — Z3201 Encounter for pregnancy test, result positive: Secondary | ICD-10-CM | POA: Diagnosis not present

## 2017-01-09 DIAGNOSIS — O0900 Supervision of pregnancy with history of infertility, unspecified trimester: Secondary | ICD-10-CM | POA: Diagnosis not present

## 2017-02-21 DIAGNOSIS — Z3A14 14 weeks gestation of pregnancy: Secondary | ICD-10-CM | POA: Diagnosis not present

## 2017-02-21 DIAGNOSIS — Z36 Encounter for antenatal screening for chromosomal anomalies: Secondary | ICD-10-CM | POA: Diagnosis not present

## 2017-02-21 DIAGNOSIS — O09521 Supervision of elderly multigravida, first trimester: Secondary | ICD-10-CM | POA: Diagnosis not present

## 2017-02-21 LAB — OB RESULTS CONSOLE GC/CHLAMYDIA
Chlamydia: NEGATIVE
Gonorrhea: NEGATIVE

## 2017-02-21 LAB — OB RESULTS CONSOLE ABO/RH: RH Type: POSITIVE

## 2017-02-21 LAB — OB RESULTS CONSOLE HIV ANTIBODY (ROUTINE TESTING): HIV: NONREACTIVE

## 2017-02-21 LAB — OB RESULTS CONSOLE ANTIBODY SCREEN: Antibody Screen: NEGATIVE

## 2017-02-21 LAB — OB RESULTS CONSOLE RUBELLA ANTIBODY, IGM: Rubella: IMMUNE

## 2017-02-21 LAB — OB RESULTS CONSOLE RPR: RPR: NONREACTIVE

## 2017-02-21 LAB — OB RESULTS CONSOLE HEPATITIS B SURFACE ANTIGEN: Hepatitis B Surface Ag: NEGATIVE

## 2017-02-22 ENCOUNTER — Encounter: Payer: Self-pay | Admitting: Nurse Practitioner

## 2017-02-22 ENCOUNTER — Ambulatory Visit: Payer: BLUE CROSS/BLUE SHIELD | Admitting: Nurse Practitioner

## 2017-02-22 DIAGNOSIS — R5383 Other fatigue: Secondary | ICD-10-CM | POA: Diagnosis not present

## 2017-02-22 DIAGNOSIS — G4709 Other insomnia: Secondary | ICD-10-CM | POA: Insufficient documentation

## 2017-02-22 DIAGNOSIS — F5101 Primary insomnia: Secondary | ICD-10-CM | POA: Diagnosis not present

## 2017-02-22 MED ORDER — ZOLPIDEM TARTRATE 10 MG PO TABS: 10.0000 mg | ORAL_TABLET | Freq: Every evening | ORAL | 5 refills | Status: DC | PRN

## 2017-02-22 NOTE — Progress Notes (Signed)
[facility] [place], [place] [address]  Internal MEDICINE  Office Visit Note  Patient Name: Deanna Sims  [id number]  [id number]  Date of Service: 02/22/2017     Complaints/HPI Pt is here for routine follow up.  The patient is here for routine follow up. She does need to have refills for her ambien. She takes 5mg  tablets when needed. She did, recently find out she is pregnant. Expecting in July. This prescription has ben okay'd per her OB.     Current Medication: Outpatient Encounter Medications as of 02/22/2017  Medication Sig  . acetaminophen (TYLENOL) 325 MG tablet Take 2 tablets (650 mg total) by mouth every 4 (four) hours as needed (for pain scale < 4).  . fluticasone (FLONASE) 50 MCG/ACT nasal spray Place 1 spray into both nostrils daily as needed for allergies or rhinitis.  Marland Kitchen zolpidem (AMBIEN) 10 MG tablet Take 10 mg by mouth at bedtime as needed for sleep.  . coconut oil OIL Apply 1 application topically as needed. (Patient not taking: Reported on 02/22/2017)  . ibuprofen (ADVIL,MOTRIN) 600 MG tablet Take 1 tablet (600 mg total) by mouth every 6 (six) hours as needed for mild pain. (Patient not taking: Reported on 02/22/2017)  . oxyCODONE-acetaminophen (PERCOCET/ROXICET) 5-325 MG tablet Take 2 tablets by mouth every 4 (four) hours as needed (1-2 for severe pain). (Patient not taking: Reported on 02/22/2017)  . Prenatal Vit-Fe Fumarate-FA (PRENATAL MULTIVITAMIN) TABS tablet Take 1 tablet by mouth daily at 12 noon.  . senna-docusate (SENOKOT-S) 8.6-50 MG tablet Take 2 tablets by mouth daily. (Patient not taking: Reported on 02/22/2017)  . Sertraline HCl (ZOLOFT PO) Take by mouth.  . simethicone (MYLICON) 80 MG chewable tablet Chew 1 tablet (80 mg total) by mouth 3 (three) times daily after meals. (Patient not taking: Reported on 02/22/2017)   No facility-administered encounter medications on file as of 02/22/2017.     Surgical History: Past Surgical  History:  Procedure Laterality Date  . CESAREAN SECTION N/A 08/11/2015   Procedure: Primary CESAREAN SECTION;  Surgeon: Brien Few, MD;  Location: [facility];  Service: Obstetrics;  Laterality: N/A;  EDD: 08/17/15 Allergy: PCN  . DILATION AND EVACUATION N/A 03/15/2014   Procedure: DILATATION AND EVACUATION;  Surgeon: Lyman Speller, MD;  Location: [facility];  Service: Gynecology;  Laterality: N/A;  . LAPAROSCOPY N/A 03/15/2014   Procedure: LAPAROSCOPY OPERATIVE;  Surgeon: Lyman Speller, MD;  Location: [facility];  Service: Gynecology;  Laterality: N/A;  . WISDOM TOOTH EXTRACTION      Medical History: Past Medical History:  Diagnosis Date  . Gestational hypertension   . Insomnia   . Postpartum care following repeat cesarean delivery (6/29) 08/12/2015  . Seasonal allergies   . Sinusitis     Family History: Family History  Problem Relation Age of Onset  . Uterine cancer Mother 22  . Cancer - Ovarian Maternal Grandmother   . Cancer - Lung Maternal Grandmother   . Cancer - Lung Maternal Grandfather     Social History   Socioeconomic History  . Marital status: Married    Spouse name: Not on file  . Number of children: Not on file  . Years of education: Not on file  . Highest education level: Not on file  Social Needs  . Financial resource strain: Not on file  . Food insecurity - worry: Not on file  . Food insecurity - inability: Not on file  . Transportation needs - medical: Not on  file  . Transportation needs - non-medical: Not on file  Occupational History  . Not on file  Tobacco Use  . Smoking status: Former Smoker    Packs/day: 1.00    Years: 13.00    Pack years: 13.00    Types: Cigarettes    Last attempt to quit: 08/12/2013    Years since quitting: 3.5  . Smokeless tobacco: Never Used  Substance and Sexual Activity  . Alcohol use: Yes    Alcohol/week: 3.0 oz    Types: 5 Glasses of wine per week    Comment: none with pregnancy  . Drug use: No  .  Sexual activity: Yes    Partners: Male    Birth control/protection: None    Comment: approx 7-[redacted] wks gestation?? per patient  Other Topics Concern  . Not on file  Social History Narrative  . Not on file      Review of Systems  Constitutional: Positive for fatigue.  HENT: Positive for congestion. Negative for sinus pressure and sinus pain.   Respiratory: Negative.  Negative for cough, chest tightness and wheezing.   Cardiovascular: Negative for chest pain and palpitations.  Gastrointestinal: Negative.  Negative for constipation, diarrhea, nausea and vomiting.  Endocrine: Negative.   Genitourinary:       Patient 4 months pregnant. New baby is due 08/18/2017  Musculoskeletal: Negative.   Skin: Negative.   Allergic/Immunologic: Negative.   Neurological: Negative.   Hematological: Negative.   Psychiatric/Behavioral: Positive for sleep disturbance.    Today's Vitals   02/22/17 0841  BP: 120/80  Pulse: 80  Resp: 16  SpO2: 98%  Weight: 242 lb (109.8 kg)  Height: 5\' 11"  (1.803 m)    Physical Exam  Constitutional: She is oriented to person, place, and time. She appears well-developed and well-nourished.  HENT:  Head: Normocephalic and atraumatic.  Eyes: Pupils are equal, round, and reactive to light.  Neck: Normal range of motion. Neck supple. No thyromegaly present.  Cardiovascular: Normal rate, regular rhythm and normal heart sounds.  Pulmonary/Chest: Effort normal and breath sounds normal. No respiratory distress.  Abdominal: Soft. There is no tenderness.  Musculoskeletal: Normal range of motion.  Neurological: She is alert and oriented to person, place, and time.  Skin: Skin is warm and dry.  Psychiatric: She has a normal mood and affect.  Nursing note and vitals reviewed.    Assessment/Plan:   ICD-10-CM   1. 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 Fatigue, unspecified type R53.83     1. Renew ambien 10mg  tablets at night as needed for insomnia.  Patient generally taking only 1/2 tablet when taken, which is not every night.  2. Fatigue likely due to new pregnancy. Blood work done at [facility] office this past Monday. Patient awaiting results.   She should follow up 6 months and sooner if needed.    General Counseling: I have discussed the findings of the evaluation and examination with Brealynn.  I have also discussed any further diagnostic evaluation that may be needed or ordered today. Nzinga verbalizes understanding of the findings of todays visit. We also reviewed her medications today. she has been encouraged to call the office with any questions or concerns that should arise related to todays visit.  This patient was seen by Leretha Pol, FNP- C in Collaboration with Dr Lavera Guise as a part of collaborative care agreement   Time spent:15 minutes    Dr Lavera Guise Internal medicine

## 2017-02-27 DIAGNOSIS — Z3A15 15 weeks gestation of pregnancy: Secondary | ICD-10-CM | POA: Diagnosis not present

## 2017-02-27 DIAGNOSIS — Z361 Encounter for antenatal screening for raised alphafetoprotein level: Secondary | ICD-10-CM | POA: Diagnosis not present

## 2017-02-27 DIAGNOSIS — O09522 Supervision of elderly multigravida, second trimester: Secondary | ICD-10-CM | POA: Diagnosis not present

## 2017-02-27 DIAGNOSIS — O09521 Supervision of elderly multigravida, first trimester: Secondary | ICD-10-CM | POA: Diagnosis not present

## 2017-03-22 DIAGNOSIS — O09522 Supervision of elderly multigravida, second trimester: Secondary | ICD-10-CM | POA: Diagnosis not present

## 2017-03-22 DIAGNOSIS — Z3A18 18 weeks gestation of pregnancy: Secondary | ICD-10-CM | POA: Diagnosis not present

## 2017-03-22 DIAGNOSIS — Z363 Encounter for antenatal screening for malformations: Secondary | ICD-10-CM | POA: Diagnosis not present

## 2017-04-04 DIAGNOSIS — O09522 Supervision of elderly multigravida, second trimester: Secondary | ICD-10-CM | POA: Diagnosis not present

## 2017-04-04 DIAGNOSIS — Z3A20 20 weeks gestation of pregnancy: Secondary | ICD-10-CM | POA: Diagnosis not present

## 2017-04-04 DIAGNOSIS — Z362 Encounter for other antenatal screening follow-up: Secondary | ICD-10-CM | POA: Diagnosis not present

## 2017-04-19 DIAGNOSIS — M9904 Segmental and somatic dysfunction of sacral region: Secondary | ICD-10-CM | POA: Diagnosis not present

## 2017-04-19 DIAGNOSIS — M5386 Other specified dorsopathies, lumbar region: Secondary | ICD-10-CM | POA: Diagnosis not present

## 2017-04-19 DIAGNOSIS — M9903 Segmental and somatic dysfunction of lumbar region: Secondary | ICD-10-CM | POA: Diagnosis not present

## 2017-05-01 DIAGNOSIS — M9904 Segmental and somatic dysfunction of sacral region: Secondary | ICD-10-CM | POA: Diagnosis not present

## 2017-05-01 DIAGNOSIS — M9903 Segmental and somatic dysfunction of lumbar region: Secondary | ICD-10-CM | POA: Diagnosis not present

## 2017-05-01 DIAGNOSIS — M5386 Other specified dorsopathies, lumbar region: Secondary | ICD-10-CM | POA: Diagnosis not present

## 2017-05-02 DIAGNOSIS — Z3A24 24 weeks gestation of pregnancy: Secondary | ICD-10-CM | POA: Diagnosis not present

## 2017-05-02 DIAGNOSIS — O09522 Supervision of elderly multigravida, second trimester: Secondary | ICD-10-CM | POA: Diagnosis not present

## 2017-05-22 DIAGNOSIS — Z3689 Encounter for other specified antenatal screening: Secondary | ICD-10-CM | POA: Diagnosis not present

## 2017-05-22 DIAGNOSIS — O09522 Supervision of elderly multigravida, second trimester: Secondary | ICD-10-CM | POA: Diagnosis not present

## 2017-05-22 DIAGNOSIS — Z3A27 27 weeks gestation of pregnancy: Secondary | ICD-10-CM | POA: Diagnosis not present

## 2017-06-10 DIAGNOSIS — Z3A29 29 weeks gestation of pregnancy: Secondary | ICD-10-CM | POA: Diagnosis not present

## 2017-06-10 DIAGNOSIS — O09523 Supervision of elderly multigravida, third trimester: Secondary | ICD-10-CM | POA: Diagnosis not present

## 2017-06-12 DIAGNOSIS — L918 Other hypertrophic disorders of the skin: Secondary | ICD-10-CM | POA: Diagnosis not present

## 2017-06-12 DIAGNOSIS — I788 Other diseases of capillaries: Secondary | ICD-10-CM | POA: Diagnosis not present

## 2017-06-12 DIAGNOSIS — L720 Epidermal cyst: Secondary | ICD-10-CM | POA: Diagnosis not present

## 2017-06-12 DIAGNOSIS — L821 Other seborrheic keratosis: Secondary | ICD-10-CM | POA: Diagnosis not present

## 2017-06-12 DIAGNOSIS — D225 Melanocytic nevi of trunk: Secondary | ICD-10-CM | POA: Diagnosis not present

## 2017-06-26 DIAGNOSIS — Z3A32 32 weeks gestation of pregnancy: Secondary | ICD-10-CM | POA: Diagnosis not present

## 2017-06-26 DIAGNOSIS — O3663X0 Maternal care for excessive fetal growth, third trimester, not applicable or unspecified: Secondary | ICD-10-CM | POA: Diagnosis not present

## 2017-07-11 DIAGNOSIS — Z3A34 34 weeks gestation of pregnancy: Secondary | ICD-10-CM | POA: Diagnosis not present

## 2017-07-11 DIAGNOSIS — O3663X0 Maternal care for excessive fetal growth, third trimester, not applicable or unspecified: Secondary | ICD-10-CM | POA: Diagnosis not present

## 2017-07-16 ENCOUNTER — Other Ambulatory Visit: Payer: Self-pay | Admitting: Obstetrics and Gynecology

## 2017-07-19 DIAGNOSIS — O3663X0 Maternal care for excessive fetal growth, third trimester, not applicable or unspecified: Secondary | ICD-10-CM | POA: Diagnosis not present

## 2017-07-19 DIAGNOSIS — Z3A35 35 weeks gestation of pregnancy: Secondary | ICD-10-CM | POA: Diagnosis not present

## 2017-07-19 DIAGNOSIS — Z3685 Encounter for antenatal screening for Streptococcus B: Secondary | ICD-10-CM | POA: Diagnosis not present

## 2017-07-19 LAB — OB RESULTS CONSOLE GBS: GBS: NEGATIVE

## 2017-07-25 DIAGNOSIS — Z3A36 36 weeks gestation of pregnancy: Secondary | ICD-10-CM | POA: Diagnosis not present

## 2017-07-25 DIAGNOSIS — O3663X0 Maternal care for excessive fetal growth, third trimester, not applicable or unspecified: Secondary | ICD-10-CM | POA: Diagnosis not present

## 2017-07-30 ENCOUNTER — Telehealth (HOSPITAL_COMMUNITY): Payer: Self-pay | Admitting: *Deleted

## 2017-07-30 ENCOUNTER — Encounter (HOSPITAL_COMMUNITY): Payer: Self-pay | Admitting: *Deleted

## 2017-07-30 NOTE — Telephone Encounter (Signed)
Preadmission screen  

## 2017-08-01 ENCOUNTER — Telehealth (HOSPITAL_COMMUNITY): Payer: Self-pay | Admitting: *Deleted

## 2017-08-01 NOTE — Telephone Encounter (Signed)
Preadmission screen  

## 2017-08-02 ENCOUNTER — Telehealth (HOSPITAL_COMMUNITY): Payer: Self-pay | Admitting: *Deleted

## 2017-08-02 DIAGNOSIS — Z3A37 37 weeks gestation of pregnancy: Secondary | ICD-10-CM | POA: Diagnosis not present

## 2017-08-02 DIAGNOSIS — O3663X0 Maternal care for excessive fetal growth, third trimester, not applicable or unspecified: Secondary | ICD-10-CM | POA: Diagnosis not present

## 2017-08-02 NOTE — Telephone Encounter (Signed)
Preadmission screen  

## 2017-08-06 ENCOUNTER — Encounter (HOSPITAL_COMMUNITY): Payer: Self-pay

## 2017-08-08 DIAGNOSIS — Z3A38 38 weeks gestation of pregnancy: Secondary | ICD-10-CM | POA: Diagnosis not present

## 2017-08-08 DIAGNOSIS — O3663X0 Maternal care for excessive fetal growth, third trimester, not applicable or unspecified: Secondary | ICD-10-CM | POA: Diagnosis not present

## 2017-08-12 NOTE — Patient Instructions (Signed)
Deanna Sims  08/12/2017   Your procedure is scheduled on:  08/14/2017  Enter through the Main Entrance of [facility] up the phone at the desk and dial [phone]  Call this number if you have problems the morning of surgery:[phone]  Remember:   Do not eat food:(After Midnight) Desps de medianoche.  Do not drink clear liquids: (After Midnight) Desps de medianoche.  Take these medicines the morning of surgery with A SIP OF WATER: none   Do not wear jewelry, make-up or nail polish.  Do not wear lotions, powders, or perfumes. Do not wear deodorant.  Do not shave 48 hours prior to surgery.  Do not bring valuables to the hospital.  [facility] is not   responsible for any belongings or valuables brought to the hospital.  Contacts, dentures or bridgework may not be worn into surgery.  Leave suitcase in the car. After surgery it may be brought to your room.  For patients admitted to the hospital, checkout time is 11:00 AM the day of              discharge.    N/A   Please read over the following fact sheets that you were given:   Surgical Site Infection Prevention

## 2017-08-13 ENCOUNTER — Encounter (HOSPITAL_COMMUNITY)
Admission: RE | Admit: 2017-08-13 | Discharge: 2017-08-13 | Disposition: A | Discharge status: Home / Self Care | Payer: BLUE CROSS/BLUE SHIELD | Source: Ambulatory Visit | Attending: Obstetrics and Gynecology | Admitting: Obstetrics and Gynecology

## 2017-08-13 DIAGNOSIS — Z3A00 Weeks of gestation of pregnancy not specified: Secondary | ICD-10-CM | POA: Diagnosis not present

## 2017-08-13 DIAGNOSIS — O34219 Maternal care for unspecified type scar from previous cesarean delivery: Secondary | ICD-10-CM | POA: Diagnosis not present

## 2017-08-13 DIAGNOSIS — Z23 Encounter for immunization: Secondary | ICD-10-CM | POA: Diagnosis not present

## 2017-08-13 DIAGNOSIS — Z880 Allergy status to penicillin: Secondary | ICD-10-CM | POA: Diagnosis not present

## 2017-08-13 DIAGNOSIS — O322XX0 Maternal care for transverse and oblique lie, not applicable or unspecified: Secondary | ICD-10-CM | POA: Diagnosis present

## 2017-08-13 DIAGNOSIS — O34211 Maternal care for low transverse scar from previous cesarean delivery: Secondary | ICD-10-CM | POA: Diagnosis not present

## 2017-08-13 DIAGNOSIS — Z8249 Family history of ischemic heart disease and other diseases of the circulatory system: Secondary | ICD-10-CM | POA: Diagnosis not present

## 2017-08-13 DIAGNOSIS — Z3A39 39 weeks gestation of pregnancy: Secondary | ICD-10-CM | POA: Diagnosis not present

## 2017-08-13 DIAGNOSIS — Z87891 Personal history of nicotine dependence: Secondary | ICD-10-CM | POA: Diagnosis not present

## 2017-08-13 DIAGNOSIS — O99214 Obesity complicating childbirth: Secondary | ICD-10-CM | POA: Diagnosis not present

## 2017-08-13 LAB — CBC
HCT: 37.8 % (ref 36.0–46.0)
Hemoglobin: 12.8 g/dL (ref 12.0–15.0)
MCH: 29.8 pg (ref 26.0–34.0)
MCHC: 33.9 g/dL (ref 30.0–36.0)
MCV: 87.9 fL (ref 78.0–100.0)
Platelets: 110 10*3/uL — ABNORMAL LOW (ref 150–400)
RBC: 4.3 MIL/uL (ref 3.87–5.11)
RDW: 12.9 % (ref 11.5–15.5)
WBC: 7.2 10*3/uL (ref 4.0–10.5)

## 2017-08-13 LAB — TYPE AND SCREEN
ABO/RH(D): O POS
Antibody Screen: NEGATIVE

## 2017-08-14 ENCOUNTER — Inpatient Hospital Stay (HOSPITAL_COMMUNITY)
Admission: RE | Admit: 2017-08-14 | Discharge: 2017-08-16 | DRG: 788 | Disposition: A | Discharge status: Home / Self Care | Payer: BLUE CROSS/BLUE SHIELD | Attending: Obstetrics and Gynecology | Admitting: Obstetrics and Gynecology

## 2017-08-14 ENCOUNTER — Encounter (HOSPITAL_COMMUNITY): Payer: Self-pay | Admitting: *Deleted

## 2017-08-14 ENCOUNTER — Inpatient Hospital Stay (HOSPITAL_COMMUNITY): Payer: BLUE CROSS/BLUE SHIELD | Admitting: Anesthesiology

## 2017-08-14 ENCOUNTER — Encounter (HOSPITAL_COMMUNITY)
Admission: RE | Disposition: A | Discharge status: Home / Self Care | Payer: Self-pay | Source: Home / Self Care | Attending: Obstetrics and Gynecology

## 2017-08-14 DIAGNOSIS — Z98891 History of uterine scar from previous surgery: Secondary | ICD-10-CM

## 2017-08-14 DIAGNOSIS — O34219 Maternal care for unspecified type scar from previous cesarean delivery: Secondary | ICD-10-CM | POA: Diagnosis not present

## 2017-08-14 DIAGNOSIS — Z880 Allergy status to penicillin: Secondary | ICD-10-CM | POA: Diagnosis not present

## 2017-08-14 DIAGNOSIS — Z3A39 39 weeks gestation of pregnancy: Secondary | ICD-10-CM

## 2017-08-14 DIAGNOSIS — Z87891 Personal history of nicotine dependence: Secondary | ICD-10-CM | POA: Diagnosis not present

## 2017-08-14 DIAGNOSIS — O34211 Maternal care for low transverse scar from previous cesarean delivery: Secondary | ICD-10-CM | POA: Diagnosis present

## 2017-08-14 DIAGNOSIS — O99214 Obesity complicating childbirth: Secondary | ICD-10-CM | POA: Diagnosis present

## 2017-08-14 DIAGNOSIS — O322XX0 Maternal care for transverse and oblique lie, not applicable or unspecified: Secondary | ICD-10-CM | POA: Diagnosis present

## 2017-08-14 DIAGNOSIS — Z8249 Family history of ischemic heart disease and other diseases of the circulatory system: Secondary | ICD-10-CM | POA: Diagnosis not present

## 2017-08-14 DIAGNOSIS — Z3A00 Weeks of gestation of pregnancy not specified: Secondary | ICD-10-CM | POA: Diagnosis not present

## 2017-08-14 LAB — RPR: RPR Ser Ql: NONREACTIVE

## 2017-08-14 LAB — CBC
HCT: 40.3 % (ref 36.0–46.0)
Hemoglobin: 13.5 g/dL (ref 12.0–15.0)
MCH: 29.4 pg (ref 26.0–34.0)
MCHC: 33.5 g/dL (ref 30.0–36.0)
MCV: 87.8 fL (ref 78.0–100.0)
Platelets: 145 10*3/uL — ABNORMAL LOW (ref 150–400)
RBC: 4.59 MIL/uL (ref 3.87–5.11)
RDW: 12.9 % (ref 11.5–15.5)
WBC: 9.4 10*3/uL (ref 4.0–10.5)

## 2017-08-14 SURGERY — Surgical Case
Anesthesia: Monitor Anesthesia Care

## 2017-08-14 MED ORDER — DEXAMETHASONE SODIUM PHOSPHATE 4 MG/ML IJ SOLN
INTRAMUSCULAR | Status: AC
  Filled 2017-08-14: qty 1

## 2017-08-14 MED ORDER — ONDANSETRON HCL 4 MG/2ML IJ SOLN
INTRAMUSCULAR | Status: DC | PRN
  Administered 2017-08-14: 4 mg via INTRAVENOUS

## 2017-08-14 MED ORDER — TETANUS-DIPHTH-ACELL PERTUSSIS 5-2.5-18.5 LF-MCG/0.5 IM SUSP: 0.5000 mL | Freq: Once | INTRAMUSCULAR | Status: DC

## 2017-08-14 MED ORDER — LACTATED RINGERS IV SOLN
INTRAVENOUS | Status: DC | PRN
  Administered 2017-08-14: 15:00:00 via INTRAVENOUS

## 2017-08-14 MED ORDER — DEXAMETHASONE SODIUM PHOSPHATE 10 MG/ML IJ SOLN
INTRAMUSCULAR | Status: DC | PRN
  Administered 2017-08-14: 4 mg via INTRAVENOUS

## 2017-08-14 MED ORDER — COCONUT OIL OIL
1.0000 "application " | TOPICAL_OIL | Status: DC | PRN
  Administered 2017-08-16: 1 via TOPICAL
  Filled 2017-08-14: qty 120

## 2017-08-14 MED ORDER — OXYTOCIN 10 UNIT/ML IJ SOLN
INTRAVENOUS | Status: DC | PRN
  Administered 2017-08-14: 40 [IU] via INTRAVENOUS

## 2017-08-14 MED ORDER — BUPIVACAINE HCL (PF) 0.25 % IJ SOLN
INTRAMUSCULAR | Status: DC | PRN
  Administered 2017-08-14: 20 mL

## 2017-08-14 MED ORDER — SCOPOLAMINE 1 MG/3DAYS TD PT72
1.0000 | MEDICATED_PATCH | Freq: Once | TRANSDERMAL | Status: DC
  Administered 2017-08-14: 1.5 mg via TRANSDERMAL
  Filled 2017-08-14: qty 1

## 2017-08-14 MED ORDER — FENTANYL CITRATE (PF) 100 MCG/2ML IJ SOLN
25.0000 ug | INTRAMUSCULAR | Status: DC | PRN
  Administered 2017-08-14: 25 ug via INTRAVENOUS
  Administered 2017-08-14: 50 ug via INTRAVENOUS

## 2017-08-14 MED ORDER — BUPIVACAINE IN DEXTROSE 0.75-8.25 % IT SOLN
INTRATHECAL | Status: DC | PRN
  Administered 2017-08-14: 1.6 mg via INTRATHECAL

## 2017-08-14 MED ORDER — OXYTOCIN 40 UNITS IN LACTATED RINGERS INFUSION - SIMPLE MED: 2.5000 [IU]/h | INTRAVENOUS | Status: AC

## 2017-08-14 MED ORDER — SODIUM CHLORIDE 0.9% FLUSH: 3.0000 mL | INTRAVENOUS | Status: DC | PRN

## 2017-08-14 MED ORDER — BUPIVACAINE HCL (PF) 0.25 % IJ SOLN
INTRAMUSCULAR | Status: AC
  Filled 2017-08-14: qty 30

## 2017-08-14 MED ORDER — LACTATED RINGERS IV SOLN
INTRAVENOUS | Status: DC
  Administered 2017-08-14: 13:00:00 via INTRAVENOUS

## 2017-08-14 MED ORDER — PHENYLEPHRINE 8 MG IN D5W 100 ML (0.08MG/ML) PREMIX OPTIME
INJECTION | INTRAVENOUS | Status: AC
  Filled 2017-08-14: qty 100

## 2017-08-14 MED ORDER — OXYTOCIN 10 UNIT/ML IJ SOLN
INTRAMUSCULAR | Status: AC
  Filled 2017-08-14: qty 4

## 2017-08-14 MED ORDER — MORPHINE SULFATE (PF) 0.5 MG/ML IJ SOLN
INTRAMUSCULAR | Status: AC
  Filled 2017-08-14: qty 10

## 2017-08-14 MED ORDER — MENTHOL 3 MG MT LOZG: 1.0000 | LOZENGE | OROMUCOSAL | Status: DC | PRN

## 2017-08-14 MED ORDER — SIMETHICONE 80 MG PO CHEW
80.0000 mg | CHEWABLE_TABLET | Freq: Three times a day (TID) | ORAL | Status: DC
  Administered 2017-08-14 – 2017-08-16 (×5): 80 mg via ORAL
  Filled 2017-08-14 (×5): qty 1

## 2017-08-14 MED ORDER — METOCLOPRAMIDE HCL 5 MG/ML IJ SOLN
INTRAMUSCULAR | Status: DC | PRN
  Administered 2017-08-14 (×2): 5 mg via INTRAVENOUS

## 2017-08-14 MED ORDER — MORPHINE SULFATE (PF) 0.5 MG/ML IJ SOLN
INTRAMUSCULAR | Status: DC | PRN
  Administered 2017-08-14: 200 mg via INTRATHECAL

## 2017-08-14 MED ORDER — SIMETHICONE 80 MG PO CHEW: 80.0000 mg | CHEWABLE_TABLET | ORAL | Status: DC | PRN

## 2017-08-14 MED ORDER — FENTANYL CITRATE (PF) 100 MCG/2ML IJ SOLN
INTRAMUSCULAR | Status: AC
  Filled 2017-08-14: qty 2

## 2017-08-14 MED ORDER — DIPHENHYDRAMINE HCL 25 MG PO CAPS: 25.0000 mg | ORAL_CAPSULE | ORAL | Status: DC | PRN

## 2017-08-14 MED ORDER — SENNOSIDES-DOCUSATE SODIUM 8.6-50 MG PO TABS
2.0000 | ORAL_TABLET | ORAL | Status: DC
  Administered 2017-08-14 – 2017-08-15 (×2): 2 via ORAL
  Filled 2017-08-14 (×2): qty 2

## 2017-08-14 MED ORDER — MEPERIDINE HCL 25 MG/ML IJ SOLN: 6.2500 mg | INTRAMUSCULAR | Status: DC | PRN

## 2017-08-14 MED ORDER — FENTANYL CITRATE (PF) 100 MCG/2ML IJ SOLN
INTRAMUSCULAR | Status: DC | PRN
  Administered 2017-08-14: 10 ug via INTRATHECAL

## 2017-08-14 MED ORDER — CEFAZOLIN SODIUM-DEXTROSE 2-4 GM/100ML-% IV SOLN
2.0000 g | INTRAVENOUS | Status: AC
  Administered 2017-08-14: 2 g via INTRAVENOUS
  Filled 2017-08-14: qty 100

## 2017-08-14 MED ORDER — NALBUPHINE HCL 10 MG/ML IJ SOLN: 5.0000 mg | Freq: Once | INTRAMUSCULAR | Status: DC | PRN

## 2017-08-14 MED ORDER — METHYLERGONOVINE MALEATE 0.2 MG/ML IJ SOLN: 0.2000 mg | INTRAMUSCULAR | Status: DC | PRN

## 2017-08-14 MED ORDER — NALOXONE HCL 0.4 MG/ML IJ SOLN: 0.4000 mg | INTRAMUSCULAR | Status: DC | PRN

## 2017-08-14 MED ORDER — ACETAMINOPHEN 325 MG PO TABS
650.0000 mg | ORAL_TABLET | ORAL | Status: DC | PRN
  Administered 2017-08-14 (×2): 650 mg via ORAL
  Filled 2017-08-14 (×2): qty 2

## 2017-08-14 MED ORDER — NALBUPHINE HCL 10 MG/ML IJ SOLN
5.0000 mg | INTRAMUSCULAR | Status: DC | PRN
  Administered 2017-08-14: 5 mg via INTRAVENOUS
  Filled 2017-08-14: qty 1

## 2017-08-14 MED ORDER — IBUPROFEN 600 MG PO TABS
600.0000 mg | ORAL_TABLET | Freq: Four times a day (QID) | ORAL | Status: DC
  Administered 2017-08-14 – 2017-08-16 (×7): 600 mg via ORAL
  Filled 2017-08-14 (×7): qty 1

## 2017-08-14 MED ORDER — NALBUPHINE HCL 10 MG/ML IJ SOLN: 5.0000 mg | INTRAMUSCULAR | Status: DC | PRN

## 2017-08-14 MED ORDER — LACTATED RINGERS IV SOLN
INTRAVENOUS | Status: DC | PRN
  Administered 2017-08-14 (×4): via INTRAVENOUS

## 2017-08-14 MED ORDER — PRENATAL MULTIVITAMIN CH
1.0000 | ORAL_TABLET | Freq: Every day | ORAL | Status: DC
  Administered 2017-08-15 – 2017-08-16 (×2): 1 via ORAL
  Filled 2017-08-14 (×2): qty 1

## 2017-08-14 MED ORDER — PROMETHAZINE HCL 25 MG/ML IJ SOLN: 6.2500 mg | INTRAMUSCULAR | Status: DC | PRN

## 2017-08-14 MED ORDER — ONDANSETRON HCL 4 MG/2ML IJ SOLN
INTRAMUSCULAR | Status: AC
  Filled 2017-08-14: qty 2

## 2017-08-14 MED ORDER — ONDANSETRON HCL 4 MG/2ML IJ SOLN: 4.0000 mg | Freq: Three times a day (TID) | INTRAMUSCULAR | Status: DC | PRN

## 2017-08-14 MED ORDER — SIMETHICONE 80 MG PO CHEW
80.0000 mg | CHEWABLE_TABLET | ORAL | Status: DC
  Administered 2017-08-14 – 2017-08-15 (×2): 80 mg via ORAL
  Filled 2017-08-14 (×2): qty 1

## 2017-08-14 MED ORDER — DIPHENHYDRAMINE HCL 25 MG PO CAPS: 25.0000 mg | ORAL_CAPSULE | Freq: Four times a day (QID) | ORAL | Status: DC | PRN

## 2017-08-14 MED ORDER — METHYLERGONOVINE MALEATE 0.2 MG PO TABS: 0.2000 mg | ORAL_TABLET | ORAL | Status: DC | PRN

## 2017-08-14 MED ORDER — LACTATED RINGERS IV SOLN
INTRAVENOUS | Status: DC
  Administered 2017-08-14 – 2017-08-15 (×2): via INTRAVENOUS

## 2017-08-14 MED ORDER — OXYCODONE-ACETAMINOPHEN 5-325 MG PO TABS
1.0000 | ORAL_TABLET | ORAL | Status: DC | PRN
  Administered 2017-08-15 (×2): 1 via ORAL
  Filled 2017-08-14 (×2): qty 1

## 2017-08-14 MED ORDER — DEXTROSE 5 % IV SOLN
1.0000 ug/kg/h | INTRAVENOUS | Status: DC | PRN
  Filled 2017-08-14: qty 5

## 2017-08-14 MED ORDER — WITCH HAZEL-GLYCERIN EX PADS: 1.0000 "application " | MEDICATED_PAD | CUTANEOUS | Status: DC | PRN

## 2017-08-14 MED ORDER — ZOLPIDEM TARTRATE 5 MG PO TABS: 5.0000 mg | ORAL_TABLET | Freq: Every evening | ORAL | Status: DC | PRN

## 2017-08-14 MED ORDER — OXYCODONE-ACETAMINOPHEN 5-325 MG PO TABS
2.0000 | ORAL_TABLET | ORAL | Status: DC | PRN
  Administered 2017-08-15 – 2017-08-16 (×7): 2 via ORAL
  Filled 2017-08-14 (×6): qty 2

## 2017-08-14 MED ORDER — DIPHENHYDRAMINE HCL 50 MG/ML IJ SOLN: 12.5000 mg | INTRAMUSCULAR | Status: DC | PRN

## 2017-08-14 MED ORDER — DIBUCAINE 1 % RE OINT: 1.0000 "application " | TOPICAL_OINTMENT | RECTAL | Status: DC | PRN

## 2017-08-14 MED ORDER — PHENYLEPHRINE 8 MG IN D5W 100 ML (0.08MG/ML) PREMIX OPTIME
INJECTION | INTRAVENOUS | Status: DC | PRN
  Administered 2017-08-14: 60 ug/min via INTRAVENOUS

## 2017-08-14 MED ORDER — PHENYLEPHRINE 40 MCG/ML (10ML) SYRINGE FOR IV PUSH (FOR BLOOD PRESSURE SUPPORT)
PREFILLED_SYRINGE | INTRAVENOUS | Status: AC
  Filled 2017-08-14: qty 10

## 2017-08-14 SURGICAL SUPPLY — 36 items
ADH SKN CLS LQ APL DERMABOND (GAUZE/BANDAGES/DRESSINGS) ×1
CHLORAPREP W/TINT 26ML (MISCELLANEOUS) ×2 IMPLANT
CLAMP CORD UMBIL (MISCELLANEOUS) IMPLANT
CLOTH BEACON ORANGE TIMEOUT ST (SAFETY) ×2 IMPLANT
DERMABOND ADHESIVE PROPEN (GAUZE/BANDAGES/DRESSINGS) ×1
DERMABOND ADVANCED .7 DNX6 (GAUZE/BANDAGES/DRESSINGS) IMPLANT
DRSG OPSITE POSTOP 4X10 (GAUZE/BANDAGES/DRESSINGS) ×2 IMPLANT
ELECT REM PT RETURN 9FT ADLT (ELECTROSURGICAL) ×2
ELECTRODE REM PT RTRN 9FT ADLT (ELECTROSURGICAL) ×1 IMPLANT
EXTRACTOR VACUUM M CUP 4 TUBE (SUCTIONS) IMPLANT
GLOVE BIO SURGEON STRL SZ7.5 (GLOVE) ×2 IMPLANT
GLOVE BIOGEL PI IND STRL 7.0 (GLOVE) ×1 IMPLANT
GLOVE BIOGEL PI INDICATOR 7.0 (GLOVE) ×1
GOWN STRL REUS W/TWL LRG LVL3 (GOWN DISPOSABLE) ×4 IMPLANT
KIT ABG SYR 3ML LUER SLIP (SYRINGE) IMPLANT
NDL HYPO 25X5/8 SAFETYGLIDE (NEEDLE) IMPLANT
NDL SPNL 20GX3.5 QUINCKE YW (NEEDLE) IMPLANT
NEEDLE HYPO 22GX1.5 SAFETY (NEEDLE) ×2 IMPLANT
NEEDLE HYPO 25X5/8 SAFETYGLIDE (NEEDLE) IMPLANT
NEEDLE SPNL 20GX3.5 QUINCKE YW (NEEDLE) IMPLANT
NS IRRIG 1000ML POUR BTL (IV SOLUTION) ×2 IMPLANT
PACK C SECTION WH (CUSTOM PROCEDURE TRAY) ×2 IMPLANT
PENCIL SMOKE EVAC W/HOLSTER (ELECTROSURGICAL) ×2 IMPLANT
SUT MNCRL 0 VIOLET CTX 36 (SUTURE) ×2 IMPLANT
SUT MNCRL AB 3-0 PS2 27 (SUTURE) IMPLANT
SUT MON AB 2-0 CT1 27 (SUTURE) ×2 IMPLANT
SUT MON AB-0 CT1 36 (SUTURE) ×4 IMPLANT
SUT MONOCRYL 0 CTX 36 (SUTURE) ×2
SUT PLAIN 0 NONE (SUTURE) IMPLANT
SUT PLAIN 2 0 (SUTURE)
SUT PLAIN 2 0 XLH (SUTURE) IMPLANT
SUT PLAIN ABS 2-0 CT1 27XMFL (SUTURE) IMPLANT
SYR 20CC LL (SYRINGE) IMPLANT
SYR CONTROL 10ML LL (SYRINGE) ×2 IMPLANT
TOWEL OR 17X24 6PK STRL BLUE (TOWEL DISPOSABLE) ×2 IMPLANT
TRAY FOLEY W/BAG SLVR 14FR LF (SET/KITS/TRAYS/PACK) ×2 IMPLANT

## 2017-08-14 NOTE — Op Note (Signed)
Cesarean Section Procedure Note  Indications: previous uterine incision kerr x one Transverse Lie AMA Pre-operative Diagnosis: 39 week 2 day pregnancy.  Post-operative Diagnosis: same  Surgeon: Lovenia Kim   Assistants: Rhona Leavens  Anesthesia: Local anesthesia 0.25.% bupivacaine and Spinal anesthesia  ASA Class: 2  Procedure Details  The patient was seen in the Holding Room. The risks, benefits, complications, treatment options, and expected outcomes were discussed with the patient.  The patient concurred with the proposed plan, giving informed consent. The risks of anesthesia, infection, bleeding and possible injury to other organs discussed. Injury to bowel, bladder, or ureter with possible need for repair discussed. Possible need for transfusion with secondary risks of hepatitis or HIV acquisition discussed. Post operative complications to include but not limited to DVT, PE and Pneumonia noted. The site of surgery properly noted/marked. The patient was taken to Operating Room # 9, identified as Deanna Sims and the procedure verified as C-Section Delivery. A Time Out was held and the above information confirmed.  After induction of anesthesia, the patient was draped and prepped in the usual sterile manner. A Pfannenstiel incision was made and carried down through the subcutaneous tissue to the fascia. Fascial incision was made and extended transversely using Mayo scissors. The fascia was separated from the underlying rectus tissue superiorly and inferiorly. The peritoneum was identified and entered. Peritoneal incision was extended longitudinally. The utero-vesical peritoneal reflection was incised transversely and the bladder flap was bluntly freed from the lower uterine segment. A low transverse uterine incision(Kerr hysterotomy) was made. Delivered from transverse to OA presentation was a  female with Apgar scores of 8 at one minute and 9 at five minutes. Bulb suctioning gently  performed. Neonatal team in attendance.After the umbilical cord was clamped and cut cord blood was obtained for evaluation. The placenta was removed intact and appeared normal. The uterus was curetted with a dry lap pack. Good hemostasis was noted.The uterine outline, tubes and ovaries appeared normal. The uterine incision was closed with running locked sutures of 0 Monocryl x 2 layers. Placement of 2 additional interrupted sutures done. Hemostasis was observed. The parietal peritoneum was closed with a running 2-0 Monocryl suture. The fascia was then reapproximated with running sutures of 0 Monocryl. The skin was reapproximated with 3-0 monocryl after Eveleth closure with 2-0 plain.Sharp scar revision done.   Instrument, sponge, and needle counts were correct prior the abdominal closure and at the conclusion of the case.   Findings: As noted  Estimated Blood Loss:  500         Drains: foley                 Specimens: placenta                 Complications:  None; patient tolerated the procedure well.         Disposition: PACU - hemodynamically stable.         Condition: stable  Attending Attestation: I performed the procedure.

## 2017-08-14 NOTE — Addendum Note (Signed)
Addendum  created 08/14/17 2048 by Adalberto Ill, CRNA   Sign clinical note

## 2017-08-14 NOTE — Anesthesia Preprocedure Evaluation (Addendum)
Anesthesia Evaluation  Patient identified by MRN, date of birth, ID band Patient awake    Reviewed: Allergy & Precautions, NPO status , Patient's Chart, lab work & pertinent test results  History of Anesthesia Complications Negative for: history of anesthetic complications  Airway Mallampati: III  TM Distance: >3 FB Neck ROM: Full    Dental no notable dental hx. (+) Dental Advisory Given   Pulmonary former smoker,    Pulmonary exam normal - rhonchi       Cardiovascular hypertension, Normal cardiovascular exam     Neuro/Psych negative psych ROS   GI/Hepatic negative GI ROS, Neg liver ROS,   Endo/Other  Morbid obesity  Renal/GU negative Renal ROS     Musculoskeletal negative musculoskeletal ROS (+)   Abdominal (+) + obese,   Peds  Hematology negative hematology ROS (+)   Anesthesia Other Findings   Reproductive/Obstetrics (+) Pregnancy                            Anesthesia Physical  Anesthesia Plan  ASA: III  Anesthesia Plan: Spinal and MAC   Post-op Pain Management:    Induction:   PONV Risk Score and Plan: Ondansetron and Scopolamine patch - Pre-op  Airway Management Planned: Natural Airway  Additional Equipment:   Intra-op Plan:   Post-operative Plan:   Informed Consent: I have reviewed the patients History and Physical, chart, labs and discussed the procedure including the risks, benefits and alternatives for the proposed anesthesia with the patient or authorized representative who has indicated his/her understanding and acceptance.   Dental advisory given  Plan Discussed with: CRNA and Anesthesiologist  Anesthesia Plan Comments:        Anesthesia Quick Evaluation

## 2017-08-14 NOTE — H&P (Signed)
Deanna Sims is a 42 y.o. female presenting for rpt csection. OB History    Gravida  3   Para  1   Term  1   Preterm      AB  1   Living  1     SAB      TAB      Ectopic  1   Multiple  0   Live Births  1          Past Medical History:  Diagnosis Date  . Gestational hypertension   . Insomnia   . Postpartum care following repeat cesarean delivery (6/29) 08/12/2015  . Seasonal allergies   . Sinusitis    Past Surgical History:  Procedure Laterality Date  . CESAREAN SECTION N/A 08/11/2015   Procedure: Primary CESAREAN SECTION;  Surgeon: Brien Few, MD;  Location: [facility];  Service: Obstetrics;  Laterality: N/A;  EDD: 08/17/15 Allergy: PCN  . DILATION AND EVACUATION N/A 03/15/2014   Procedure: DILATATION AND EVACUATION;  Surgeon: Lyman Speller, MD;  Location: [facility];  Service: Gynecology;  Laterality: N/A;  . LAPAROSCOPY N/A 03/15/2014   Procedure: LAPAROSCOPY OPERATIVE;  Surgeon: Lyman Speller, MD;  Location: [facility];  Service: Gynecology;  Laterality: N/A;  . WISDOM TOOTH EXTRACTION     Family History: family history includes Cancer - Lung in her maternal grandfather and maternal grandmother; Cancer - Ovarian in her maternal grandmother; Uterine cancer (age of onset: 61) in her mother. Social History:  reports that she quit smoking about 4 years ago. Her smoking use included cigarettes. She has a 13.00 pack-year smoking history. She has never used smokeless tobacco. She reports that she drinks about 3.0 oz of alcohol per week. She reports that she does not use drugs.     Maternal Diabetes: No Genetic Screening: Normal Maternal Ultrasounds/Referrals: Normal Fetal Ultrasounds or other Referrals:  None Maternal Substance Abuse:  No Significant Maternal Medications:  None Significant Maternal Lab Results:  None Other Comments:  None  Review of Systems  Constitutional: Negative.   All other systems reviewed and are negative.  Maternal  Medical History:  Fetal activity: Perceived fetal activity is normal.   Last perceived fetal movement was within the past hour.    Prenatal complications: no prenatal complications Prenatal Complications - Diabetes: none.      unknown if currently breastfeeding. Maternal Exam:  Uterine Assessment: Contraction strength is mild.  Contraction frequency is rare.   Abdomen: Patient reports no abdominal tenderness. Surgical scars: low transverse.   Fetal presentation: vertex  Introitus: Normal vulva. Normal vagina.  Ferning test: not done.  Nitrazine test: not done. Amniotic fluid character: not assessed.  Pelvis: questionable for delivery.   Cervix: Cervix evaluated by digital exam.     Physical Exam  Constitutional: She is oriented to person, place, and time. She appears well-developed and well-nourished.  Neck: Normal range of motion. Neck supple.  Cardiovascular: Normal rate and regular rhythm.  Respiratory: Effort normal and breath sounds normal.  GI: Soft. Bowel sounds are normal.  Genitourinary: Vagina normal and uterus normal.  Musculoskeletal: Normal range of motion.  Neurological: She is alert and oriented to person, place, and time. She has normal reflexes.  Skin: Skin is warm and dry.  Psychiatric: She has a normal mood and affect.    Prenatal labs: ABO, Rh: --/--/O POS (07/02 1120) Antibody: NEG (07/02 1120) Rubella: Immune (01/10 0000) RPR: Non Reactive (07/02 1120)  HBsAg: Negative (01/10 0000)  HIV: Non-reactive (  01/10 0000)  GBS: Negative (06/07 0000)   Assessment/Plan: 39wk iup Previous csection for rpt AMA   Julianne Chamberlin J 08/14/2017, 12:15 PM

## 2017-08-14 NOTE — Anesthesia Procedure Notes (Signed)
Spinal  Patient location during procedure: OR Start time: 08/14/2017 2:21 PM End time: 08/14/2017 4:32 PM Staffing Anesthesiologist: Duane Boston, MD Performed: anesthesiologist and resident/CRNA  Preanesthetic Checklist Completed: patient identified, surgical consent, pre-op evaluation, timeout performed, IV checked, risks and benefits discussed and monitors and equipment checked Spinal Block Patient position: sitting Prep: DuraPrep Patient monitoring: cardiac monitor, continuous pulse ox and blood pressure Approach: midline Location: L2-3 Injection technique: single-shot Needle Needle type: Pencan  Needle gauge: 24 G Needle length: 9 cm Additional Notes Functioning IV was confirmed and monitors were applied. Sterile prep and drape, including hand hygiene and sterile gloves were used. The patient was positioned and the spine was prepped. The skin was anesthetized with lidocaine.  Free flow of clear CSF was obtained prior to injecting local anesthetic into the CSF.  The spinal needle aspirated freely following injection.  The needle was carefully withdrawn.  The patient tolerated the procedure well.

## 2017-08-14 NOTE — Anesthesia Postprocedure Evaluation (Signed)
Anesthesia Post Note  Patient: Tourist information centre manager  Procedure(s) Performed: Repeat CESAREAN SECTION (N/A )     Anesthesia Type: MAC Level of consciousness: awake, awake and alert and oriented Pain management: pain level controlled Vital Signs Assessment: vitals unstable and post-procedure vital signs reviewed and stable Respiratory status: spontaneous breathing, nonlabored ventilation and respiratory function stable Cardiovascular status: blood pressure returned to baseline and stable Postop Assessment: no headache, no backache, spinal receding, adequate PO intake, able to ambulate, no apparent nausea or vomiting and patient able to bend at knees Anesthetic complications: no    Last Vitals:  Vitals:   08/14/17 1829 08/14/17 2015  BP: 131/79 131/67  Pulse: 67 81  Resp: 19 18  Temp: 36.8 C 36.7 C  SpO2: 98% 96%    Last Pain:  Vitals:   08/14/17 2015  TempSrc: Oral  PainSc:    Pain Goal: Patients Stated Pain Goal: 4 (08/14/17 1314)               Stefani Dama

## 2017-08-14 NOTE — Transfer of Care (Signed)
Immediate Anesthesia Transfer of Care Note  Patient: Tourist information centre manager  Procedure(s) Performed: Repeat CESAREAN SECTION (N/A )  Patient Location: PACU  Anesthesia Type:Spinal  Level of Consciousness: awake, alert  and oriented  Airway & Oxygen Therapy: Patient Spontanous Breathing  Post-op Assessment: Report given to RN and Post -op Vital signs reviewed and stable  Post vital signs: Reviewed and stable  Last Vitals:  Vitals Value Taken Time  BP 124/76 08/14/2017  3:52 PM  Temp    Pulse 75 08/14/2017  3:54 PM  Resp 12 08/14/2017  3:54 PM  SpO2 92 % 08/14/2017  3:54 PM  Vitals shown include unvalidated device data.  Last Pain:  Vitals:   08/14/17 1314  TempSrc: Oral  PainSc: 0-No pain      Patients Stated Pain Goal: 4 (08/14/17 1314)  Pain Score: 0  Complications: No apparent anesthesia complications

## 2017-08-14 NOTE — Lactation Note (Signed)
This note was copied from a baby's chart. Lactation Consultation Note  Patient Name: Deanna Sims Today's Date: 08/14/2017 Reason for consult: Initial assessment;Term  P2 mother whose infant is now 38 hours old.  Mother breastfed her 42 year old "for a short while."  Infant being held by mother and showing feeding cues.  I offered to assist/observe a latch but mother stated she did not need assistance and feels like breastfeeding is going well so far.    Encouraged feeding 8-12 times/24 hours or more if baby shows cues.  Reviewed feeding cues.  Also encouraged STS (baby was swaddled in blanket), breast massage and hand expression after feedings.  Mother was familiar with hand expression.    Mom made aware of O/P services, breastfeeding support groups, community resources, and our phone # for post-discharge questions. Mother will call for assistance as needed.   Maternal Data Formula Feeding for Exclusion: No Has patient been taught Hand Expression?: Yes Does the patient have breastfeeding experience prior to this delivery?: Yes  Feeding    LATCH Score                   Interventions    Lactation Tools Discussed/Used     Consult Status Consult Status: Follow-up Date: 08/15/17 Follow-up type: In-patient    Sherine Cortese R Deandrew Hoecker 08/14/2017, 8:55 PM

## 2017-08-14 NOTE — Anesthesia Postprocedure Evaluation (Signed)
Anesthesia Post Note  Patient: Tourist information centre manager  Procedure(s) Performed: Repeat CESAREAN SECTION (N/A )     Patient location during evaluation: PACU Anesthesia Type: MAC and Spinal Level of consciousness: awake and alert Pain management: pain level controlled Vital Signs Assessment: post-procedure vital signs reviewed and stable Respiratory status: spontaneous breathing and respiratory function stable Cardiovascular status: blood pressure returned to baseline and stable Postop Assessment: spinal receding Anesthetic complications: no    Last Vitals:  Vitals:   08/14/17 1645 08/14/17 1700  BP: 123/75 119/79  Pulse: 69 65  Resp: 16 16  Temp:  (!) 36.3 C  SpO2: 97% 97%    Last Pain:  Vitals:   08/14/17 1700  TempSrc: Axillary  PainSc: 4    Pain Goal: Patients Stated Pain Goal: 4 (08/14/17 1314)               [place]

## 2017-08-15 ENCOUNTER — Other Ambulatory Visit: Payer: Self-pay

## 2017-08-15 ENCOUNTER — Encounter (HOSPITAL_COMMUNITY): Payer: Self-pay | Admitting: *Deleted

## 2017-08-15 LAB — CBC
HCT: 35.2 % — ABNORMAL LOW (ref 36.0–46.0)
Hemoglobin: 11.7 g/dL — ABNORMAL LOW (ref 12.0–15.0)
MCH: 29.6 pg (ref 26.0–34.0)
MCHC: 33.2 g/dL (ref 30.0–36.0)
MCV: 89.1 fL (ref 78.0–100.0)
Platelets: 126 10*3/uL — ABNORMAL LOW (ref 150–400)
RBC: 3.95 MIL/uL (ref 3.87–5.11)
RDW: 13 % (ref 11.5–15.5)
WBC: 10.8 10*3/uL — ABNORMAL HIGH (ref 4.0–10.5)

## 2017-08-15 NOTE — Progress Notes (Signed)
Subjective: POD# 1 Information for the patient's newborn:  Deanna Sims, Deanna Sims [[id number]]  female  Baby name: undecided (Tanner?)  Reports feeling well Feeding: breast Patient reports tolerating PO.  Breast symptoms: none Pain controlled with PO meds Denies HA/SOB/C/P/N/V/dizziness. Flatus absent. She reports vaginal bleeding as normal, without clots.  She is ambulating, urinating without difficulty.     Objective:   VS:    Vitals:   08/15/17 0717 08/15/17 0722 08/15/17 0724 08/15/17 0915  BP: 121/83 122/64 124/83 (!) 117/54  Pulse: 65 60 78 78  Resp:    18  Temp:    98.3 F (36.8 C)  TempSrc:    Oral  SpO2:    97%  Weight:      Height:          Intake/Output Summary (Last 24 hours) at 08/15/2017 1057 Last data filed at 08/15/2017 1008 Gross per 24 hour  Intake 5281.25 ml  Output 2625 ml  Net 2656.25 ml        Recent Labs    08/14/17 1316 08/15/17 0518  WBC 9.4 10.8*  HGB 13.5 11.7*  HCT 40.3 35.2*  PLT 145* 126*     Blood type: --/--/O POS (07/02 1120)  Rubella: Immune (01/10 0000)     Physical Exam:  General: alert, cooperative and no distress CV: Regular rate and rhythm Resp: clear Abdomen: soft, nontender, normal bowel sounds Incision: clean, dry and intact Uterine Fundus: firm, below umbilicus, nontender Lochia: minimal Ext: edema +1 pedal, no cords or calf tenderness      Assessment/Plan: 42 y.o.   POD# 1. [id number]                  Active Problems:   R C/S 7/3   Postpartum care following repeat cesarean delivery   Doing well, stable.               Advance diet as tolerated Encourage rest when baby rests Breastfeeding support Encourage to ambulate Routine post-op care  Juliene Pina, CNM, MSN 08/15/2017, 10:57 AM

## 2017-08-15 NOTE — Plan of Care (Signed)
  Problem: Elimination: Goal: Will not experience complications related to urinary retention Outcome: Progressing  Foley catheter removed and pt has voided once since it's removal, 277ms.  Pt encouraged to increase fluid intake and attempt to void every few hours, even if she doesn't feel the urge.  Pt verbalized understanding.   Problem: Activity: Goal: Will verbalize the importance of balancing activity with adequate rest periods Outcome: Completed/Met  Pt ambulating in the room without assistance.  Pt reports increase pain with ambulation but reports the pain is better controlled with 2 percocet.  Pt encouraged to ambulate in the hallways.

## 2017-08-16 MED ORDER — OXYCODONE-ACETAMINOPHEN 5-325 MG PO TABS: 1.0000 | ORAL_TABLET | ORAL | 0 refills | Status: DC | PRN

## 2017-08-16 MED ORDER — IBUPROFEN 600 MG PO TABS: 600.0000 mg | ORAL_TABLET | Freq: Four times a day (QID) | ORAL | 0 refills | Status: DC

## 2017-08-16 NOTE — Discharge Instructions (Signed)
Postpartum Care After Cesarean Delivery °The period of time right after you deliver your newborn is called the postpartum period. °What kind of medical care will I receive? °· You may continue to receive fluids and medicines through an IV tube inserted into one of your veins. °· You may have small, flexible tube (catheter) draining urine from your bladder into a bag outside of your body. The catheter will be removed as soon as possible. °· You may be given a squirt bottle to use when you go to the bathroom. You may use this until you are comfortable wiping as usual. To use the squirt bottle, follow these steps: °? Before you urinate, fill the squirt bottle with warm water. The water should be warm. Do not use hot water. °? After you urinate, while you are sitting on the toilet, use the squirt bottle to rinse the area around your urethra and vaginal opening. This rinses away any urine and blood. °? You may do this instead of wiping. As you start healing, you may use the squirt bottle before wiping yourself. Make sure to wipe gently. °? Fill the squirt bottle with clean water every time you use the bathroom. °· You will be given sanitary pads to wear. °· Your incision will be monitored to make sure it is healing properly. You will be told when it is safe for your stitches, staples, or skin adhesive tape to be removed. °What can I expect? °· You may not feel the need to urinate for several hours after delivery. °· You will have some soreness and pain in your abdomen. You may have a small amount of blood or clear fluid coming from your incision. °· If you are breastfeeding, you may have uterine contractions every time you breastfeed for up to several weeks postpartum. Uterine contractions help your uterus return to its normal size. °· It is normal to have vaginal bleeding (lochia) after delivery. The amount and appearance of lochia is often similar to a menstrual period in the first week after delivery. It will  gradually decrease over the next few weeks to a dry, yellow-brown discharge. For most women, lochia stops completely by 6-8 weeks after delivery. Vaginal bleeding can vary from woman to woman. °· Within the first few days after delivery, you may have breast engorgement. This is when your breasts feel heavy, full, and uncomfortable. Your breasts may also throb and feel hard, tightly stretched, warm, and tender. After this occurs, you may have milk leaking from your breasts. Your health care provider can help you relieve discomfort due to breast engorgement. Breast engorgement should go away within a few days. °· You may feel more sad or worried than normal due to hormonal changes after delivery. These feelings should not last more than a few days. If these feelings do not go away after several days, speak with your health care provider. °How should I care for myself? °· Tell your health care provider if you have pain or discomfort. °· Drink enough water to keep your urine clear or pale yellow. °· Wash your hands thoroughly with soap and water for at least 20 seconds after changing your sanitary pads or using the toilet, and before holding or feeding your baby. °· If you are not breastfeeding, avoid touching your breasts a lot. Doing this can make your breasts produce more milk. °· If you become weak or lightheaded, or you feel like you might faint, ask for help before: °? Getting out of bed. °? Showering. °·   Change your sanitary pads frequently. Watch for any changes in your flow, such as a sudden increase in volume, a change in color, or the passing of large blood clots. If you pass a blood clot from your vagina, save it to show to your health care provider. Do not flush blood clots down the toilet without having your health care provider look at them. °· Make sure that all your vaccinations are up to date. This can help protect you and your baby from getting certain diseases. You may need to have immunizations done  before you leave the hospital. °· If desired, talk with your health care provider about methods of family planning or birth control (contraception). °How can I start bonding with my baby? °Spending as much time as possible with your baby is very important. During this time, you and your baby can get to know each other and develop a bond. Having your baby stay with you in your room (rooming in) can give you time to get to know your baby. Rooming in can also help you become comfortable caring for your baby. Breastfeeding can also help you bond with your baby. °How can I plan for returning home with my baby? °· Make sure that you have a car seat installed in your vehicle. °? Your car seat should be checked by a certified car seat installer to make sure that it is installed safely. °? Make sure that your baby fits into the car seat safely. °· Ask your health care provider any questions you have about caring for yourself or your baby. Make sure that you are able to contact your health care provider with any questions after leaving the hospital. °This information is not intended to replace advice given to you by your health care provider. Make sure you discuss any questions you have with your health care provider. °Document Released: 10/24/2011 Document Revised: 07/04/2015 Document Reviewed: 01/03/2015 °Elsevier Interactive Patient Education © 2018 [employer] ° ° °Postpartum Depression and Baby Blues °The postpartum period begins right after the birth of a baby. During this time, there is often a great amount of joy and excitement. It is also a time of many changes in the life of the parents. Regardless of how many times a mother gives birth, each child brings new challenges and dynamics to the family. It is not unusual to have feelings of excitement along with confusing shifts in moods, emotions, and thoughts. All mothers are at risk of developing postpartum depression or the "baby blues." These mood changes can occur  right after giving birth, or they may occur many months after giving birth. The baby blues or postpartum depression can be mild or severe. Additionally, postpartum depression can go away rather quickly, or it can be a long-term condition. °What are the causes? °Raised hormone levels and the rapid drop in those levels are thought to be a main cause of postpartum depression and the baby blues. A number of hormones change during and after pregnancy. Estrogen and progesterone usually decrease right after the delivery of your baby. The levels of thyroid hormone and various cortisol steroids also rapidly drop. Other factors that play a role in these mood changes include major life events and genetics. °What increases the risk? °If you have any of the following risks for the baby blues or postpartum depression, know what symptoms to watch out for during the postpartum period. Risk factors that may increase the likelihood of getting the baby blues or postpartum depression include: °·   Having a personal or family history of depression. °· Having depression while being pregnant. °· Having premenstrual mood issues or mood issues related to oral contraceptives. °· Having a lot of life stress. °· Having marital conflict. °· Lacking a social support network. °· Having a baby with special needs. °· Having health problems, such as diabetes. ° °What are the signs or symptoms? °Symptoms of baby blues include: °· Brief changes in mood, such as going from extreme happiness to sadness. °· Decreased concentration. °· Difficulty sleeping. °· Crying spells, tearfulness. °· Irritability. °· Anxiety. ° °Symptoms of postpartum depression typically begin within the first month after giving birth. These symptoms include: °· Difficulty sleeping or excessive sleepiness. °· Marked weight loss. °· Agitation. °· Feelings of worthlessness. °· Lack of interest in activity or food. ° °Postpartum psychosis is a very serious condition and can be  dangerous. Fortunately, it is rare. Displaying any of the following symptoms is cause for immediate medical attention. Symptoms of postpartum psychosis include: °· Hallucinations and delusions. °· Bizarre or disorganized behavior. °· Confusion or disorientation. ° °How is this diagnosed? °A diagnosis is made by an evaluation of your symptoms. There are no medical or lab tests that lead to a diagnosis, but there are various questionnaires that a health care provider may use to identify those with the baby blues, postpartum depression, or psychosis. Often, a screening tool called the Edinburgh Postnatal Depression Scale is used to diagnose depression in the postpartum period. °How is this treated? °The baby blues usually goes away on its own in 1-2 weeks. Social support is often all that is needed. You will be encouraged to get adequate sleep and rest. Occasionally, you may be given medicines to help you sleep. °Postpartum depression requires treatment because it can last several months or longer if it is not treated. Treatment may include individual or group therapy, medicine, or both to address any social, physiological, and psychological factors that may play a role in the depression. Regular exercise, a healthy diet, rest, and social support may also be strongly recommended. °Postpartum psychosis is more serious and needs treatment right away. Hospitalization is often needed. °Follow these instructions at home: °· Get as much rest as you can. Nap when the baby sleeps. °· Exercise regularly. Some women find yoga and walking to be beneficial. °· Eat a balanced and nourishing diet. °· Do little things that you enjoy. Have a cup of tea, take a bubble bath, read your favorite magazine, or listen to your favorite music. °· Avoid alcohol. °· Ask for help with household chores, cooking, grocery shopping, or running errands as needed. Do not try to do everything. °· Talk to people close to you about how you are feeling.  Get support from your partner, family members, friends, or other new moms. °· Try to stay positive in how you think. Think about the things you are grateful for. °· Do not spend a lot of time alone. °· Only take over-the-counter or prescription medicine as directed by your health care provider. °· Keep all your postpartum appointments. °· Let your health care provider know if you have any concerns. °Contact a health care provider if: °You are having a reaction to or problems with your medicine. °Get help right away if: °· You have suicidal feelings. °· You think you may harm the baby or someone else. °This information is not intended to replace advice given to you by your health care provider. Make sure you discuss any questions you have   with your health care provider. °Document Released: 11/03/2003 Document Revised: 07/07/2015 Document Reviewed: 11/10/2012 °Elsevier Interactive Patient Education © 2017 Elsevier Inc. ° °

## 2017-08-16 NOTE — Discharge Summary (Signed)
Obstetric Discharge Summary  Patient ID: Deanna Sims MRN: [id number] DOB/AGE: [birth year] 42 y.o.   Date of Admission: 08/14/2017  Date of Discharge:  08/16/17  Admitting Diagnosis: Scheduled cesarean section at [redacted]w[redacted]d  Secondary Diagnosis: Advanced Maternal Age  Mode of Delivery: repeat cesarean section     Discharge Diagnosis: No other diagnosis   Intrapartum Procedures: Atificial rupture of membranes and spinal   Post partum procedures: None  Complications: None   Brief Hospital Course    Deanna Sims is a [id number] who underwent cesarean section on 08/14/2017.  Patient had an uncomplicated surgery; for further details of this surgery, please refer to the operative note.  Patient had an uncomplicated postpartum course.  By time of discharge on POD#2, her pain was controlled on oral pain medications; she had appropriate lochia and was ambulating, voiding without difficulty, tolerating regular diet and passing flatus.   She was deemed stable for discharge to home.    Labs: CBC Latest Ref Rng & Units 08/15/2017 08/14/2017 08/13/2017  WBC 4.0 - 10.5 K/uL 10.8(H) 9.4 7.2  Hemoglobin 12.0 - 15.0 g/dL 11.7(L) 13.5 12.8  Hematocrit 36.0 - 46.0 % 35.2(L) 40.3 37.8  Platelets 150 - 400 K/uL 126(L) 145(L) 110(L)   O POS  Physical exam:   Temp:  [97.8 F (36.6 C)-98.7 F (37.1 C)] 97.8 F (36.6 C) (07/05 0620) Pulse Rate:  [55-81] 55 (07/05 0620) Resp:  [16] 16 (07/05 0620) BP: (120-130)/(55-81) 120/55 (07/05 0620) SpO2:  [97 %] 97 % (07/04 1318)  General: alert and no distress  Lochia: appropriate  Abdomen: soft, NT  Uterine Fundus: firm  Incision: healing well, no significant drainage, no dehiscence, no significant erythema; honeycomb dressing intact  Extremities: No evidence of DVT seen on physical exam. No lower extremity edema.  Discharge Instructions: Per After Visit Summary.  Activity: Advance as tolerated. Pelvic rest for 6 weeks.  Also refer to After Visit  Summary  Diet: Regular  Medications:  Allergies as of 08/16/2017      Reactions   Penicillins    Childhood reaction - Unknown reaction Has patient had a PCN reaction causing immediate rash, facial/tongue/throat swelling, SOB or lightheadedness with hypotension: unknown Has patient hadCN reaction causing severe rash involving mucus membranes or skin necrosis:unknown Has patient had a PCN reaction that required hospitalization unknown Has patient had a PCN reaction occurring within the last 10 years: No If all of the above answers are "NO", then may proceed with Cephalosporin use.      Medication List    STOP taking these medications   zolpidem 10 MG tablet Commonly known as:  AMBIEN     TAKE these medications   fluticasone 50 MCG/ACT nasal spray Commonly known as:  FLONASE Place 1 spray into both nostrils daily as needed for allergies or rhinitis.   ibuprofen 600 MG tablet Commonly known as:  ADVIL,MOTRIN Take 1 tablet (600 mg total) by mouth every 6 (six) hours.   oxyCODONE-acetaminophen 5-325 MG tablet Commonly known as:  PERCOCET/ROXICET Take 1 tablet by mouth every 4 (four) hours as needed (pain scale 4-7).   prenatal multivitamin Tabs tablet Take 1 tablet by mouth every evening.   senna-docusate 8.6-50 MG tablet Commonly known as:  Senokot-S Take 2 tablets by mouth daily.   simethicone 80 MG chewable tablet Commonly known as:  MYLICON Chew 1 tablet (80 mg total) by mouth 3 (three) times daily after meals.            Discharge Care Instructions  (  From admission, onward)        Start     Ordered   08/16/17 0000  Discharge wound care:    Comments:  Leave honeycomb dressing on for five (5) days then remove.   08/16/17 1236     Outpatient follow up:  Follow-up Information    Brien Few, MD Follow up.   Specialty:  Obstetrics and Gynecology Contact information: [address] [place] [address] [phone]          Discharged  Condition: stable  Discharged to: home   Newborn Data:  Disposition:home with mother  Apgars: APGAR (1 MIN): 8   APGAR (5 MINS): 8    Baby Feeding: Breast   Deanna Sims, [facility] 08/16/17 12:41 PM

## 2017-08-16 NOTE — Plan of Care (Signed)
  Problem: Elimination: Goal: Will not experience complications related to bowel motility Outcome: Progressing  Pt reports she is now passing gas but feels pressure like she needs to have a bowel movement.  Reviewed ways to increase bowel activity, including increased ambulation, increased intake of fluids, and eating high fiber foods.  Prune juice/apple juice mix offered to pt.  She reports she will call out for this drink after she finishes breastfeeding.  Pt verbalized understanding of all teaching.

## 2017-08-21 DIAGNOSIS — H538 Other visual disturbances: Secondary | ICD-10-CM | POA: Diagnosis not present

## 2017-08-27 ENCOUNTER — Encounter: Payer: Self-pay | Admitting: Internal Medicine

## 2017-08-27 ENCOUNTER — Ambulatory Visit: Payer: BLUE CROSS/BLUE SHIELD | Admitting: Internal Medicine

## 2017-08-27 VITALS — BP 108/76 | HR 84 | Resp 16 | Ht 71.0 in | Wt 240.4 lb

## 2017-08-27 DIAGNOSIS — G510 Bell's palsy: Secondary | ICD-10-CM

## 2017-08-27 DIAGNOSIS — O906 Postpartum mood disturbance: Secondary | ICD-10-CM

## 2017-08-27 DIAGNOSIS — H538 Other visual disturbances: Secondary | ICD-10-CM

## 2017-08-27 MED ORDER — METHYLPREDNISOLONE 4 MG PO TABS: ORAL_TABLET | ORAL | 0 refills | Status: DC

## 2017-08-27 MED ORDER — FAMCICLOVIR 500 MG PO TABS: 500.0000 mg | ORAL_TABLET | Freq: Two times a day (BID) | ORAL | 0 refills | Status: DC

## 2017-08-27 NOTE — Progress Notes (Signed)
[facility] [address], [place] [address]  Internal MEDICINE  Office Visit Note  Patient Name: Deanna Sims  [id number]  [id number]  Date of Service: 08/27/2017  Chief Complaint  Patient presents with  . Hospitalization Follow-up    c-section on july 3rd had ongoing headaches and left ear sensitivity , blurred vision,  past Wednesday morning and thel left side of face drooping and pain in the back of head.    HPI Pt has bell's palsy since wednesday but no treatment has been started. She is having fullness in left ear. Blurred vision. She is feeling really tired and fatigued after the delivery   Current Medication: Outpatient Encounter Medications as of 08/27/2017  Medication Sig  . fluticasone (FLONASE) 50 MCG/ACT nasal spray Place 1 spray into both nostrils daily as needed for allergies or rhinitis.  Marland Kitchen ibuprofen (ADVIL,MOTRIN) 600 MG tablet Take 1 tablet (600 mg total) by mouth every 6 (six) hours.  . Prenatal Vit-Fe Fumarate-FA (PRENATAL MULTIVITAMIN) TABS tablet Take 1 tablet by mouth every evening.   . zolpidem (AMBIEN) 10 MG tablet Take 10 mg by mouth at bedtime as needed for sleep. 1/2 pill at night  . famciclovir (FAMVIR) 500 MG tablet Take 1 tablet (500 mg total) by mouth 2 (two) times daily.  . methylPREDNISolone (MEDROL) 4 MG tablet Medrol dose pack UAD taper  . oxyCODONE-acetaminophen (PERCOCET/ROXICET) 5-325 MG tablet Take 1 tablet by mouth every 4 (four) hours as needed (pain scale 4-7). (Patient not taking: Reported on 08/27/2017)  . [DISCONTINUED] senna-docusate (SENOKOT-S) 8.6-50 MG tablet Take 2 tablets by mouth daily. (Patient not taking: Reported on 02/22/2017)  . [DISCONTINUED] simethicone (MYLICON) 80 MG chewable tablet Chew 1 tablet (80 mg total) by mouth 3 (three) times daily after meals. (Patient not taking: Reported on 02/22/2017)   No facility-administered encounter medications on file as of 08/27/2017.     Surgical History: Past  Surgical History:  Procedure Laterality Date  . CESAREAN SECTION N/A 08/11/2015   Procedure: Primary CESAREAN SECTION;  Surgeon: Brien Few, MD;  Location: [facility] [facility];  Service: Obstetrics;  Laterality: N/A;  EDD: 08/17/15 Allergy: PCN  . CESAREAN SECTION N/A 08/14/2017   Procedure: Repeat CESAREAN SECTION;  Surgeon: Brien Few, MD;  Location: [place];  Service: Obstetrics;  Laterality: N/A;  EDD: 08/20/17 Allergy: Penicillin  . DILATION AND EVACUATION N/A 03/15/2014   Procedure: DILATATION AND EVACUATION;  Surgeon: Lyman Speller, MD;  Location: [facility];  Service: Gynecology;  Laterality: N/A;  . LAPAROSCOPY N/A 03/15/2014   Procedure: LAPAROSCOPY OPERATIVE;  Surgeon: Lyman Speller, MD;  Location: [facility];  Service: Gynecology;  Laterality: N/A;  . WISDOM TOOTH EXTRACTION      Medical History: Past Medical History:  Diagnosis Date  . Gestational hypertension   . Insomnia   . Postpartum care following repeat cesarean delivery (6/29) 08/12/2015  . Seasonal allergies   . Sinusitis     Family History: Family History  Problem Relation Age of Onset  . Uterine cancer Mother 17  . Cancer - Ovarian Maternal Grandmother   . Cancer - Lung Maternal Grandmother   . Cancer - Lung Maternal Grandfather     Social History   Socioeconomic History  . Marital status: Married    Spouse name: Not on file  . Number of children: Not on file  . Years of education: Not on file  . Highest education level: Not on file  Occupational History  . Not on file  Social Needs  . Financial resource strain: Not on file  . Food insecurity:    Worry: Not on file    Inability: Not on file  . Transportation needs:    Medical: Not on file    Non-medical: Not on file  Tobacco Use  . Smoking status: Former Smoker    Packs/day: 1.00    Years: 13.00    Pack years: 13.00    Types: Cigarettes    Last attempt to quit: 08/12/2013    Years since quitting: 4.0  . Smokeless tobacco:  Never Used  Substance and Sexual Activity  . Alcohol use: Yes    Alcohol/week: 3.0 oz    Types: 5 Glasses of wine per week    Comment: none with pregnancy  . Drug use: No  . Sexual activity: Yes    Partners: Male    Birth control/protection: None    Comment: approx 7-[redacted] wks gestation?? per patient  Lifestyle  . Physical activity:    Days per week: Not on file    Minutes per session: Not on file  . Stress: Not on file  Relationships  . Social connections:    Talks on phone: Not on file    Gets together: Not on file    Attends religious service: Not on file    Active member of club or organization: Not on file    Attends meetings of clubs or organizations: Not on file    Relationship status: Not on file  . Intimate partner violence:    Fear of current or ex partner: Not on file    Emotionally abused: Not on file    Physically abused: Not on file    Forced sexual activity: Not on file  Other Topics Concern  . Not on file  Social History Narrative  . Not on file   Review of Systems  Constitutional: Positive for fatigue. Negative for chills.  HENT: Positive for postnasal drip and sinus pressure. Negative for ear pain.   Eyes: Positive for pain and visual disturbance. Negative for discharge and itching.  Respiratory: Negative for cough, shortness of breath and wheezing.   Cardiovascular: Negative for chest pain.  Gastrointestinal: Negative for abdominal pain, blood in stool and vomiting.  Musculoskeletal: Negative for gait problem.  Skin: Negative for color change.  Allergic/Immunologic: Negative for environmental allergies and food allergies.  Neurological: Positive for facial asymmetry and numbness. Negative for dizziness and headaches.  Psychiatric/Behavioral: Behavioral problem: depression.    Vital Signs: BP 108/76   Pulse 84   Resp 16   Ht 5\' 11"  (1.803 m)   Wt 240 lb 6.4 oz (109 kg)   BMI 33.53 kg/m    Physical Exam  Constitutional: She is oriented to  person, place, and time. She appears well-developed and well-nourished. No distress.  HENT:  Head: Normocephalic and atraumatic.  Mouth/Throat: Oropharynx is clear and moist. No oropharyngeal exudate.  Eyes: Pupils are equal, round, and reactive to light. EOM are normal.  Neck: Normal range of motion. Neck supple. No JVD present. No tracheal deviation present. No thyromegaly present.  Cardiovascular: Normal rate, regular rhythm and normal heart sounds. Exam reveals no gallop and no friction rub.  No murmur heard. Pulmonary/Chest: Effort normal. No respiratory distress. She has no wheezes. She has no rales. She exhibits no tenderness.  Musculoskeletal: Normal range of motion.  Lymphadenopathy:    She has no cervical adenopathy.  Neurological: She is alert and oriented to person, place, and time. A cranial  nerve deficit is present.  Skin: Skin is warm and dry. She is not diaphoretic.  Psychiatric: She has a normal mood and affect.   Assessment/Plan: 1. Bell palsy Start medrol dose pack and famvir as prescribed   2. Blurred vision Might need eye patch and opthalmology app, instructed to use lubricant eye drops otc  3. Postpartum mood disturbance Emotional support is given  General Counseling: Arlet verbalizes understanding of the findings of todays visit and agrees with plan of treatment. I have discussed any further diagnostic evaluation that may be needed or ordered today. We also reviewed her medications today. she has been encouraged to call the office with any questions or concerns that should arise related to todays visit.   Meds ordered this encounter  Medications  . famciclovir (FAMVIR) 500 MG tablet    Sig: Take 1 tablet (500 mg total) by mouth 2 (two) times daily.    Dispense:  14 tablet    Refill:  0  . methylPREDNISolone (MEDROL) 4 MG tablet    Sig: Medrol dose pack UAD taper    Dispense:  21 tablet    Refill:  0    Time spent:20 Minutes   Dr Lavera Guise Internal medicine

## 2017-09-02 ENCOUNTER — Telehealth: Payer: Self-pay

## 2017-09-02 NOTE — Telephone Encounter (Signed)
Pt said that she does not want to take gabapentin because she does want to continue breast feeding and that with her eye she will just let it be for now. She said if she cannot handle pain or needs something she will give [place] a call back. She was wondering if this will eventually go away or does she need to come in again?

## 2017-09-02 NOTE — Telephone Encounter (Signed)
Pt was notified.  

## 2017-09-02 NOTE — Telephone Encounter (Signed)
If she can wait few more days, it does take longer for the nerves to heal

## 2017-09-02 NOTE — Telephone Encounter (Signed)
She said her vision is off and she cannot close her eyes. Everything has stayed the same since her last visit. Her headaches have gotten worse. She asked what the gabapentin will be used to treat.

## 2017-09-02 NOTE — Telephone Encounter (Signed)
Its for nerve pain and eye doctor can help her with her eye, let me know

## 2017-09-25 ENCOUNTER — Encounter: Payer: Self-pay | Admitting: Nurse Practitioner

## 2017-09-25 ENCOUNTER — Ambulatory Visit: Payer: BLUE CROSS/BLUE SHIELD | Admitting: Nurse Practitioner

## 2017-09-25 VITALS — BP 94/64 | HR 80 | Resp 16 | Ht 71.0 in | Wt 240.4 lb

## 2017-09-25 DIAGNOSIS — R41840 Attention and concentration deficit: Secondary | ICD-10-CM

## 2017-09-25 DIAGNOSIS — F5101 Primary insomnia: Secondary | ICD-10-CM

## 2017-09-25 DIAGNOSIS — R5383 Other fatigue: Secondary | ICD-10-CM

## 2017-09-26 DIAGNOSIS — Z124 Encounter for screening for malignant neoplasm of cervix: Secondary | ICD-10-CM | POA: Diagnosis not present

## 2017-09-26 DIAGNOSIS — Z113 Encounter for screening for infections with a predominantly sexual mode of transmission: Secondary | ICD-10-CM | POA: Diagnosis not present

## 2017-09-26 DIAGNOSIS — Z1151 Encounter for screening for human papillomavirus (HPV): Secondary | ICD-10-CM | POA: Diagnosis not present

## 2017-10-09 DIAGNOSIS — R41840 Attention and concentration deficit: Secondary | ICD-10-CM | POA: Insufficient documentation

## 2017-10-09 DIAGNOSIS — R5383 Other fatigue: Secondary | ICD-10-CM | POA: Insufficient documentation

## 2017-10-09 NOTE — Progress Notes (Signed)
[facility] [place], [place] [address]  Internal MEDICINE  Office Visit Note  Patient Name: Deanna Sims  [id number]  [id number]  Date of Service: 10/09/2017  Chief Complaint  Patient presents with  . Fatigue    medication refills    The patient is c/o fatigue. She had new baby, early in July. She has first baby who is getting ready to turn two. She is getting ready to return to work. 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 She is felling unfocused and very tired. She was taking adderall 5mg  when needed. She would like to start back on this for her return to work. She is not breast feeding her infant. She had good results without negative side effects.  She would also like to have a refill of her [place]. She generally takes 1/2 tablet at night to help her sleep. She is able to wake up when needed by her newborn. Has no grogginess or residual sleepiness the next day.       Current Medication: Outpatient Encounter Medications as of 09/25/2017  Medication Sig  . amphetamine-dextroamphetamine (ADDERALL) 5 MG tablet Take 5 mg by mouth daily.  . Prenatal Vit-Fe Fumarate-FA (PRENATAL MULTIVITAMIN) TABS tablet Take 1 tablet by mouth every evening.   . zolpidem (AMBIEN) 10 MG tablet Take 10 mg by mouth at bedtime as needed for sleep. 1/2 pill at night  . famciclovir (FAMVIR) 500 MG tablet Take 1 tablet (500 mg total) by mouth 2 (two) times daily. (Patient not taking: Reported on 09/25/2017)  . fluticasone (FLONASE) 50 MCG/ACT nasal spray Place 1 spray into both nostrils daily as needed for allergies or rhinitis.  Marland Kitchen ibuprofen (ADVIL,MOTRIN) 600 MG tablet Take 1 tablet (600 mg total) by mouth every 6 (six) hours. (Patient not taking: Reported on 09/25/2017)  . methylPREDNISolone (MEDROL) 4 MG tablet Medrol dose pack UAD taper (Patient not taking: Reported on 09/25/2017)  . oxyCODONE-acetaminophen (PERCOCET/ROXICET) 5-325 MG tablet Take 1 tablet by mouth  every 4 (four) hours as needed (pain scale 4-7). (Patient not taking: Reported on 08/27/2017)   No facility-administered encounter medications on file as of 09/25/2017.     Surgical History: Past Surgical History:  Procedure Laterality Date  . CESAREAN SECTION N/A 08/11/2015   Procedure: Primary CESAREAN SECTION;  Surgeon: Brien Few, MD;  Location: [facility];  Service: Obstetrics;  Laterality: N/A;  EDD: 08/17/15 Allergy: PCN  . CESAREAN SECTION N/A 08/14/2017   Procedure: Repeat CESAREAN SECTION;  Surgeon: Brien Few, MD;  Location: [facility];  Service: Obstetrics;  Laterality: N/A;  EDD: 08/20/17 Allergy: Penicillin  . DILATION AND EVACUATION N/A 03/15/2014   Procedure: DILATATION AND EVACUATION;  Surgeon: Lyman Speller, MD;  Location: [facility];  Service: Gynecology;  Laterality: N/A;  . LAPAROSCOPY N/A 03/15/2014   Procedure: LAPAROSCOPY OPERATIVE;  Surgeon: Lyman Speller, MD;  Location: [facility];  Service: Gynecology;  Laterality: N/A;  . WISDOM TOOTH EXTRACTION      Medical History: Past Medical History:  Diagnosis Date  . Bell palsy   . Gestational hypertension   . Insomnia   . Postpartum care following repeat cesarean delivery (6/29) 08/12/2015  . Seasonal allergies   . Sinusitis     Family History: Family History  Problem Relation Age of Onset  . Uterine cancer Mother 69  . Cancer - Ovarian Maternal Grandmother   . Cancer - Lung Maternal Grandmother   . Cancer - Lung Maternal Grandfather  Social History   Socioeconomic History  . Marital status: Married    Spouse name: Not on file  . Number of children: Not on file  . Years of education: Not on file  . Highest education level: Not on file  Occupational History  . Not on file  Social Needs  . Financial resource strain: Not on file  . Food insecurity:    Worry: Not on file    Inability: Not on file  . Transportation needs:    Medical: Not on file    Non-medical: Not on file   Tobacco Use  . Smoking status: Former Smoker    Packs/day: 1.00    Years: 13.00    Pack years: 13.00    Types: Cigarettes    Last attempt to quit: 08/12/2013    Years since quitting: 4.1  . Smokeless tobacco: Never Used  Substance and Sexual Activity  . Alcohol use: Yes    Alcohol/week: 5.0 standard drinks    Types: 5 Glasses of wine per week    Comment: none with pregnancy  . Drug use: No  . Sexual activity: Yes    Partners: Male    Birth control/protection: None    Comment: approx 7-[redacted] wks gestation?? per patient  Lifestyle  . Physical activity:    Days per week: Not on file    Minutes per session: Not on file  . Stress: Not on file  Relationships  . Social connections:    Talks on phone: Not on file    Gets together: Not on file    Attends religious service: Not on file    Active member of club or organization: Not on file    Attends meetings of clubs or organizations: Not on file    Relationship status: Not on file  . Intimate partner violence:    Fear of current or ex partner: Not on file    Emotionally abused: Not on file    Physically abused: Not on file    Forced sexual activity: Not on file  Other Topics Concern  . Not on file  Social History Narrative  . Not on file      Review of Systems  Constitutional: Positive for fatigue. Negative for chills and unexpected weight change.  HENT: Negative for congestion, postnasal drip, rhinorrhea, sneezing and sore throat.   Eyes: Negative.  Negative for redness.  Respiratory: Negative for cough, chest tightness and shortness of breath.   Cardiovascular: Negative for chest pain and palpitations.  Gastrointestinal: Negative for abdominal pain, constipation, diarrhea, nausea and vomiting.  Endocrine: Negative for cold intolerance, heat intolerance, polydipsia, polyphagia and polyuria.  Genitourinary: Negative.  Negative for dysuria and frequency.  Musculoskeletal: Negative for arthralgias, back pain, joint swelling and  neck pain.  Skin: Negative for rash.  Allergic/Immunologic: Negative for environmental allergies.  Neurological: Negative for dizziness, tremors, numbness and headaches.  Hematological: Negative for adenopathy. Does not bruise/bleed easily.  Psychiatric/Behavioral: Positive for decreased concentration and sleep disturbance. Negative for behavioral problems (Depression) and suicidal ideas. The patient is not nervous/anxious.     Today's Vitals   09/25/17 1112  BP: 94/64  Pulse: 80  Resp: 16  SpO2: 98%  Weight: 240 lb 6.4 oz (109 kg)  Height: 5\' 11"  (1.803 m)    Physical Exam  Constitutional: She is oriented to person, place, and time. She appears well-developed and well-nourished. No distress.  HENT:  Head: Normocephalic and atraumatic.  Nose: Nose normal.  Mouth/Throat: Oropharynx is clear and  moist. No oropharyngeal exudate.  Eyes: Pupils are equal, round, and reactive to light. Conjunctivae and EOM are normal.  Neck: Normal range of motion. Neck supple. No JVD present. No tracheal deviation present. No thyromegaly present.  Cardiovascular: Normal rate, regular rhythm and normal heart sounds. Exam reveals no gallop and no friction rub.  No murmur heard. Pulmonary/Chest: Effort normal and breath sounds normal. No respiratory distress. She has no wheezes. She has no rales. She exhibits no tenderness.  Abdominal: Soft. Bowel sounds are normal.  Musculoskeletal: Normal range of motion.  Lymphadenopathy:    She has no cervical adenopathy.  Neurological: She is alert and oriented to person, place, and time. No cranial nerve deficit.  Skin: Skin is warm and dry. She is not diaphoretic.  Psychiatric: She has a normal mood and affect. Her behavior is normal. Judgment and thought content normal.  Nursing note and vitals reviewed.  Assessment/Plan: 1. Fatigue, unspecified type Likely from being post-partum and having young toddler at home. Will monitor.   2. Primary insomnia Renew  ambien. Take as needed and as prescribed.   3. Attention and concentration deficit Restart adderall 5mg  twice daily as needed. Three 30 prescriptions were written out and given to the patient. Dates are 09/25/2017, 10/24/2017, and 11/21/2017   General Counseling: Janete verbalizes understanding of the findings of todays visit and agrees with plan of treatment. I have discussed any further diagnostic evaluation that may be needed or ordered today. We also reviewed her medications today. she has been encouraged to call the office with any questions or concerns that should arise related to todays visit.   This patient was seen by Leretha Pol FNP Collaboration with Dr Lavera Guise as a part of collaborative care agreement   Time spent: 73 Minutes    Dr Lavera Guise Internal medicine

## 2017-11-06 DIAGNOSIS — M9903 Segmental and somatic dysfunction of lumbar region: Secondary | ICD-10-CM | POA: Diagnosis not present

## 2017-11-06 DIAGNOSIS — M5387 Other specified dorsopathies, lumbosacral region: Secondary | ICD-10-CM | POA: Diagnosis not present

## 2017-12-04 DIAGNOSIS — M531 Cervicobrachial syndrome: Secondary | ICD-10-CM | POA: Diagnosis not present

## 2017-12-04 DIAGNOSIS — M9902 Segmental and somatic dysfunction of thoracic region: Secondary | ICD-10-CM | POA: Diagnosis not present

## 2017-12-04 DIAGNOSIS — M9903 Segmental and somatic dysfunction of lumbar region: Secondary | ICD-10-CM | POA: Diagnosis not present

## 2017-12-04 DIAGNOSIS — M5386 Other specified dorsopathies, lumbar region: Secondary | ICD-10-CM | POA: Diagnosis not present

## 2017-12-12 DIAGNOSIS — H04122 Dry eye syndrome of left lacrimal gland: Secondary | ICD-10-CM | POA: Diagnosis not present

## 2017-12-12 DIAGNOSIS — G510 Bell's palsy: Secondary | ICD-10-CM | POA: Diagnosis not present

## 2017-12-24 ENCOUNTER — Ambulatory Visit: Payer: BLUE CROSS/BLUE SHIELD | Admitting: Internal Medicine

## 2017-12-24 ENCOUNTER — Encounter: Payer: Self-pay | Admitting: Internal Medicine

## 2017-12-24 DIAGNOSIS — R41840 Attention and concentration deficit: Secondary | ICD-10-CM

## 2017-12-24 DIAGNOSIS — F5101 Primary insomnia: Secondary | ICD-10-CM | POA: Diagnosis not present

## 2017-12-24 DIAGNOSIS — Z79899 Other long term (current) drug therapy: Secondary | ICD-10-CM

## 2017-12-24 DIAGNOSIS — G510 Bell's palsy: Secondary | ICD-10-CM

## 2017-12-24 LAB — POCT URINE DRUG SCREEN
POC Amphetamine UR: POSITIVE — AB
POC BENZODIAZEPINES UR: NOT DETECTED
POC Barbiturate UR: NOT DETECTED
POC Cocaine UR: NOT DETECTED
POC Marijuana UR: NOT DETECTED
POC Methadone UR: NOT DETECTED
POC Methamphetamine UR: NOT DETECTED
POC Opiate Ur: NOT DETECTED
POC Oxycodone UR: NOT DETECTED
POC PHENCYCLIDINE UR: NOT DETECTED
POC TRICYCLICS UR: NOT DETECTED

## 2017-12-24 MED ORDER — AMPHETAMINE-DEXTROAMPHETAMINE 5 MG PO TABS: ORAL_TABLET | ORAL | 0 refills | Status: DC

## 2017-12-24 MED ORDER — ZOLPIDEM TARTRATE 5 MG PO TABS: 5.0000 mg | ORAL_TABLET | Freq: Every evening | ORAL | 2 refills | Status: DC | PRN

## 2017-12-24 NOTE — Progress Notes (Signed)
[facility] [place], [place] [address]  Internal MEDICINE  Office Visit Note  Patient Name: Deanna Sims  [id number]  [id number]  Date of Service: 01/20/2018  Chief Complaint  Patient presents with  . Hypertension  . Insomnia  . Allergies    HPI  Pt is here for routine follow up, Bell's palsy is better/ improved, will like to get refills on her medications. Has an infant at home.   Current Medication: Outpatient Encounter Medications as of 12/24/2017  Medication Sig  . amphetamine-dextroamphetamine (ADDERALL) 5 MG tablet Take one to 2 tabs as needed  . fluticasone (FLONASE) 50 MCG/ACT nasal spray Place 1 spray into both nostrils daily as needed for allergies or rhinitis.  Marland Kitchen ibuprofen (ADVIL,MOTRIN) 600 MG tablet Take 1 tablet (600 mg total) by mouth every 6 (six) hours.  . [DISCONTINUED] amphetamine-dextroamphetamine (ADDERALL) 5 MG tablet Take 5 mg by mouth daily.  . [DISCONTINUED] zolpidem (AMBIEN) 10 MG tablet Take 5 mg by mouth at bedtime as needed for sleep. One tab po qhs   . zolpidem (AMBIEN) 5 MG tablet Take 1 tablet (5 mg total) by mouth at bedtime as needed for sleep. Take one tab po qhs as needed, may take one extra if needed  . [DISCONTINUED] famciclovir (FAMVIR) 500 MG tablet Take 1 tablet (500 mg total) by mouth 2 (two) times daily. (Patient not taking: Reported on 09/25/2017)  . [DISCONTINUED] methylPREDNISolone (MEDROL) 4 MG tablet Medrol dose pack UAD taper (Patient not taking: Reported on 09/25/2017)  . [DISCONTINUED] oxyCODONE-acetaminophen (PERCOCET/ROXICET) 5-325 MG tablet Take 1 tablet by mouth every 4 (four) hours as needed (pain scale 4-7). (Patient not taking: Reported on 08/27/2017)  . [DISCONTINUED] Prenatal Vit-Fe Fumarate-FA (PRENATAL MULTIVITAMIN) TABS tablet Take 1 tablet by mouth every evening.    No facility-administered encounter medications on file as of 12/24/2017.     Surgical History: Past Surgical History:   Procedure Laterality Date  . CESAREAN SECTION N/A 08/11/2015   Procedure: Primary CESAREAN SECTION;  Surgeon: Brien Few, MD;  Location: [facility];  Service: Obstetrics;  Laterality: N/A;  EDD: 08/17/15 Allergy: PCN  . CESAREAN SECTION N/A 08/14/2017   Procedure: Repeat CESAREAN SECTION;  Surgeon: Brien Few, MD;  Location: [facility];  Service: Obstetrics;  Laterality: N/A;  EDD: 08/20/17 Allergy: Penicillin  . DILATION AND EVACUATION N/A 03/15/2014   Procedure: DILATATION AND EVACUATION;  Surgeon: Lyman Speller, MD;  Location: [facility];  Service: Gynecology;  Laterality: N/A;  . LAPAROSCOPY N/A 03/15/2014   Procedure: LAPAROSCOPY OPERATIVE;  Surgeon: Lyman Speller, MD;  Location: [facility];  Service: Gynecology;  Laterality: N/A;  . WISDOM TOOTH EXTRACTION      Medical History: Past Medical History:  Diagnosis Date  . Bell palsy   . Gestational hypertension   . Insomnia   . Postpartum care following repeat cesarean delivery (6/29) 08/12/2015  . Seasonal allergies   . Sinusitis     Family History: Family History  Problem Relation Age of Onset  . Uterine cancer Mother 33  . Cancer - Ovarian Maternal Grandmother   . Cancer - Lung Maternal Grandmother   . Cancer - Lung Maternal Grandfather     Social History   Socioeconomic History  . Marital status: Married    Spouse name: Not on file  . Number of children: Not on file  . Years of education: Not on file  . Highest education level: Not on file  Occupational History  . Not on  file  Social Needs  . Financial resource strain: Not on file  . Food insecurity:    Worry: Not on file    Inability: Not on file  . Transportation needs:    Medical: Not on file    Non-medical: Not on file  Tobacco Use  . Smoking status: Former Smoker    Packs/day: 1.00    Years: 13.00    Pack years: 13.00    Types: Cigarettes    Last attempt to quit: 08/12/2013    Years since quitting: 4.4  . Smokeless tobacco: Never  Used  Substance and Sexual Activity  . Alcohol use: Yes    Alcohol/week: 5.0 standard drinks    Types: 5 Glasses of wine per week    Comment: none with pregnancy  . Drug use: No  . Sexual activity: Yes    Partners: Male    Birth control/protection: None    Comment: approx 7-[redacted] wks gestation?? per patient  Lifestyle  . Physical activity:    Days per week: Not on file    Minutes per session: Not on file  . Stress: Not on file  Relationships  . Social connections:    Talks on phone: Not on file    Gets together: Not on file    Attends religious service: Not on file    Active member of club or organization: Not on file    Attends meetings of clubs or organizations: Not on file    Relationship status: Not on file  . Intimate partner violence:    Fear of current or ex partner: Not on file    Emotionally abused: Not on file    Physically abused: Not on file    Forced sexual activity: Not on file  Other Topics Concern  . Not on file  Social History Narrative  . Not on file      Review of Systems  Constitutional: Negative for chills, diaphoresis and fatigue.  HENT: Negative for ear pain, postnasal drip and sinus pressure.   Eyes: Negative for photophobia, discharge, redness, itching and visual disturbance.  Respiratory: Negative for cough, shortness of breath and wheezing.   Cardiovascular: Negative for chest pain, palpitations and leg swelling.  Gastrointestinal: Negative for abdominal pain, constipation, diarrhea, nausea and vomiting.  Genitourinary: Negative for dysuria and flank pain.  Musculoskeletal: Negative for arthralgias, back pain, gait problem and neck pain.  Skin: Negative for color change.  Allergic/Immunologic: Negative for environmental allergies and food allergies.  Neurological: Negative for dizziness and headaches.  Hematological: Does not bruise/bleed easily.  Psychiatric/Behavioral: Positive for sleep disturbance. Negative for agitation, behavioral  problems (depression) and hallucinations.   Vital Signs: BP 110/80 (BP Location: Right Arm, Patient Position: Sitting, Cuff Size: Normal)   Pulse 88   Resp 16   Ht 5\' 11"  (1.803 m)   Wt 240 lb (108.9 kg)   SpO2 98%   BMI 33.47 kg/m    Physical Exam  Constitutional: She is oriented to person, place, and time. She appears well-developed and well-nourished. No distress.  HENT:  Head: Normocephalic and atraumatic.  Mouth/Throat: Oropharynx is clear and moist. No oropharyngeal exudate.  Eyes: Pupils are equal, round, and reactive to light. EOM are normal.  Neck: Normal range of motion. Neck supple. No JVD present. No tracheal deviation present. No thyromegaly present.  Cardiovascular: Normal rate, regular rhythm and normal heart sounds. Exam reveals no gallop and no friction rub.  No murmur heard. Pulmonary/Chest: Effort normal. No respiratory distress. She has  no wheezes. She has no rales. She exhibits no tenderness.  Abdominal: Soft. Bowel sounds are normal.  Musculoskeletal: Normal range of motion.  Lymphadenopathy:    She has no cervical adenopathy.  Neurological: She is alert and oriented to person, place, and time. No cranial nerve deficit.  Skin: Skin is warm and dry. She is not diaphoretic.  Psychiatric: She has a normal mood and affect. Her behavior is normal. Judgment and thought content normal.   assessment/Plan: 1. Bell palsy Improved 95%, continue to see opthalmology, getting Botox   2. History of long-term treatment with high-risk medication - POCT Urine Drug Screen  3. Primary insomnia Continue Ambien prn   4. Attention and concentration deficit Continue Adderall prn   General Counseling: Jamari verbalizes understanding of the findings of todays visit and agrees with plan of treatment. I have discussed any further diagnostic evaluation that may be needed or ordered today. We also reviewed her medications today. she has been encouraged to call the office with any  questions or concerns that should arise related to todays visit.  Refilled Controlled medications today. Reviewed risks and possible side effects associated with taking Stimulants. Combination of these drugs with other psychotropic medications could cause dizziness and drowsiness. Pt needs to Monitor symptoms and exercise caution in driving and operating heavy machinery to avoid damages to oneself, to others and to the surroundings. Patient verbalized understanding in this matter. Dependence and abuse for these drugs will be monitored closely. A Controlled substance policy and procedure is on file which allows [facility] to order a urine drug screen test at any visit. Patient understands and agrees with the plan..  Orders Placed This Encounter  Procedures  . POCT Urine Drug Screen    Meds ordered this encounter  Medications  . zolpidem (AMBIEN) 5 MG tablet    Sig: Take 1 tablet (5 mg total) by mouth at bedtime as needed for sleep. Take one tab po qhs as needed, may take one extra if needed    Dispense:  35 tablet    Refill:  2  . amphetamine-dextroamphetamine (ADDERALL) 5 MG tablet    Sig: Take one to 2 tabs as needed    Dispense:  60 tablet    Refill:  0    Time spent:20 Minutes  Dr Lavera Guise Internal medicine

## 2018-01-13 DIAGNOSIS — H04122 Dry eye syndrome of left lacrimal gland: Secondary | ICD-10-CM | POA: Diagnosis not present

## 2018-01-13 DIAGNOSIS — G510 Bell's palsy: Secondary | ICD-10-CM | POA: Diagnosis not present

## 2018-01-17 DIAGNOSIS — M5387 Other specified dorsopathies, lumbosacral region: Secondary | ICD-10-CM | POA: Diagnosis not present

## 2018-01-17 DIAGNOSIS — S336XXD Sprain of sacroiliac joint, subsequent encounter: Secondary | ICD-10-CM | POA: Diagnosis not present

## 2018-01-17 DIAGNOSIS — M461 Sacroiliitis, not elsewhere classified: Secondary | ICD-10-CM | POA: Diagnosis not present

## 2018-02-10 ENCOUNTER — Telehealth: Payer: Self-pay

## 2018-02-10 ENCOUNTER — Other Ambulatory Visit: Payer: Self-pay | Admitting: Nurse Practitioner

## 2018-02-10 DIAGNOSIS — R41840 Attention and concentration deficit: Secondary | ICD-10-CM

## 2018-02-10 MED ORDER — AMPHETAMINE-DEXTROAMPHETAMINE 5 MG PO TABS
ORAL_TABLET | ORAL | 0 refills | Status: DC
Start: 2018-02-10 — End: 2018-02-24

## 2018-02-10 NOTE — Progress Notes (Signed)
Sent single 30 day prescription for adderall 5mg  to take once to twice daily as needed. She will need to be seen for any additional refills.

## 2018-02-10 NOTE — Telephone Encounter (Signed)
PT ADVISED WE SEND MED THIS MONTH NEXT REFILLS NEED APPT

## 2018-02-10 NOTE — Telephone Encounter (Signed)
Sent single 30 day prescription for adderall 5mg  to take once to twice daily as needed. She will need to be seen for any additional refills.

## 2018-02-18 NOTE — Telephone Encounter (Signed)
Was this done?

## 2018-02-21 DIAGNOSIS — M9903 Segmental and somatic dysfunction of lumbar region: Secondary | ICD-10-CM | POA: Diagnosis not present

## 2018-02-21 DIAGNOSIS — M5387 Other specified dorsopathies, lumbosacral region: Secondary | ICD-10-CM | POA: Diagnosis not present

## 2018-02-21 DIAGNOSIS — M531 Cervicobrachial syndrome: Secondary | ICD-10-CM | POA: Diagnosis not present

## 2018-02-24 ENCOUNTER — Ambulatory Visit: Payer: BC Managed Care – PPO | Admitting: Nurse Practitioner

## 2018-02-24 ENCOUNTER — Encounter: Payer: Self-pay | Admitting: Nurse Practitioner

## 2018-02-24 VITALS — BP 106/67 | HR 85 | Resp 16 | Ht 71.0 in | Wt 235.0 lb

## 2018-02-24 DIAGNOSIS — R41840 Attention and concentration deficit: Secondary | ICD-10-CM | POA: Diagnosis not present

## 2018-02-24 DIAGNOSIS — F5101 Primary insomnia: Secondary | ICD-10-CM | POA: Diagnosis not present

## 2018-02-24 DIAGNOSIS — R5383 Other fatigue: Secondary | ICD-10-CM

## 2018-02-24 MED ORDER — AMPHETAMINE-DEXTROAMPHETAMINE 5 MG PO TABS: ORAL_TABLET | ORAL | 0 refills | Status: DC

## 2018-02-24 NOTE — Progress Notes (Signed)
[facility] [employer], [place] [address]  Internal MEDICINE  Office Visit Note  Patient Name: Deanna Sims  [id number]  [id number]  Date of Service: 02/27/2018  Chief Complaint  Patient presents with  . Hypertension    The patient is c/o fatigue. She had new baby, early in July. Her  first baby is two. She has gone back to work. 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 She is feeling unfocused and very tired. She is taking adderall 5mg  when needed. She would like to continue on this. She is not breast feeding her infant. She had good results without negative side effects.  She would also like to have a refill of her [place]. She generally takes 1/2 tablet at night to help her sleep. She is able to wake up when needed by her newborn. Has no grogginess or residual sleepiness the next day.       Current Medication: Outpatient Encounter Medications as of 02/24/2018  Medication Sig  . amphetamine-dextroamphetamine (ADDERALL) 5 MG tablet Take one to 2 tabs as needed  . fluticasone (FLONASE) 50 MCG/ACT nasal spray Place 1 spray into both nostrils daily as needed for allergies or rhinitis.  Marland Kitchen ibuprofen (ADVIL,MOTRIN) 600 MG tablet Take 1 tablet (600 mg total) by mouth every 6 (six) hours.  Marland Kitchen zolpidem (AMBIEN) 5 MG tablet Take 1 tablet (5 mg total) by mouth at bedtime as needed for sleep. Take one tab po qhs as needed, may take one extra if needed  . [DISCONTINUED] amphetamine-dextroamphetamine (ADDERALL) 5 MG tablet Take one to 2 tabs as needed  . [DISCONTINUED] amphetamine-dextroamphetamine (ADDERALL) 5 MG tablet Take one to 2 tabs as needed  . [DISCONTINUED] amphetamine-dextroamphetamine (ADDERALL) 5 MG tablet Take one to 2 tabs as needed   No facility-administered encounter medications on file as of 02/24/2018.     Surgical History: Past Surgical History:  Procedure Laterality Date  . CESAREAN SECTION N/A 08/11/2015   Procedure: Primary CESAREAN  SECTION;  Surgeon: Brien Few, MD;  Location: [place];  Service: Obstetrics;  Laterality: N/A;  EDD: 08/17/15 Allergy: PCN  . CESAREAN SECTION N/A 08/14/2017   Procedure: Repeat CESAREAN SECTION;  Surgeon: Brien Few, MD;  Location: [place];  Service: Obstetrics;  Laterality: N/A;  EDD: 08/20/17 Allergy: Penicillin  . DILATION AND EVACUATION N/A 03/15/2014   Procedure: DILATATION AND EVACUATION;  Surgeon: Lyman Speller, MD;  Location: [facility];  Service: Gynecology;  Laterality: N/A;  . LAPAROSCOPY N/A 03/15/2014   Procedure: LAPAROSCOPY OPERATIVE;  Surgeon: Lyman Speller, MD;  Location: [facility];  Service: Gynecology;  Laterality: N/A;  . WISDOM TOOTH EXTRACTION      Medical History: Past Medical History:  Diagnosis Date  . Bell palsy   . Gestational hypertension   . Insomnia   . Postpartum care following repeat cesarean delivery (6/29) 08/12/2015  . Seasonal allergies   . Sinusitis     Family History: Family History  Problem Relation Age of Onset  . Uterine cancer Mother 78  . Cancer - Ovarian Maternal Grandmother   . Cancer - Lung Maternal Grandmother   . Cancer - Lung Maternal Grandfather     Social History   Socioeconomic History  . Marital status: Married    Spouse name: Not on file  . Number of children: Not on file  . Years of education: Not on file  . Highest education level: Not on file  Occupational History  . Not on  file  Social Needs  . Financial resource strain: Not on file  . Food insecurity:    Worry: Not on file    Inability: Not on file  . Transportation needs:    Medical: Not on file    Non-medical: Not on file  Tobacco Use  . Smoking status: Former Smoker    Packs/day: 1.00    Years: 13.00    Pack years: 13.00    Types: Cigarettes    Last attempt to quit: 08/12/2013    Years since quitting: 4.5  . Smokeless tobacco: Never Used  Substance and Sexual Activity  . Alcohol use: Yes    Alcohol/week: 5.0 standard  drinks    Types: 5 Glasses of wine per week    Comment: none with pregnancy  . Drug use: No  . Sexual activity: Yes    Partners: Male    Birth control/protection: None    Comment: approx 7-[redacted] wks gestation?? per patient  Lifestyle  . Physical activity:    Days per week: Not on file    Minutes per session: Not on file  . Stress: Not on file  Relationships  . Social connections:    Talks on phone: Not on file    Gets together: Not on file    Attends religious service: Not on file    Active member of club or organization: Not on file    Attends meetings of clubs or organizations: Not on file    Relationship status: Not on file  . Intimate partner violence:    Fear of current or ex partner: Not on file    Emotionally abused: Not on file    Physically abused: Not on file    Forced sexual activity: Not on file  Other Topics Concern  . Not on file  Social History Narrative  . Not on file      Review of Systems  Constitutional: Positive for fatigue. Negative for chills and unexpected weight change.  HENT: Negative for congestion, postnasal drip, rhinorrhea, sneezing and sore throat.   Respiratory: Negative for cough, chest tightness and shortness of breath.   Cardiovascular: Negative for chest pain and palpitations.  Gastrointestinal: Negative for abdominal pain, constipation, diarrhea, nausea and vomiting.  Endocrine: Negative for cold intolerance, heat intolerance, polydipsia and polyuria.  Musculoskeletal: Negative for arthralgias, back pain, joint swelling and neck pain.  Skin: Negative for rash.  Allergic/Immunologic: Negative for environmental allergies.  Neurological: Negative for dizziness, tremors, numbness and headaches.  Hematological: Negative for adenopathy. Does not bruise/bleed easily.  Psychiatric/Behavioral: Positive for decreased concentration and sleep disturbance. Negative for behavioral problems (Depression) and suicidal ideas. The patient is not  nervous/anxious.    Today's Vitals   02/24/18 1354  BP: 106/67  Pulse: 85  Resp: 16  SpO2: 97%  Weight: 235 lb (106.6 kg)  Height: 5\' 11"  (1.803 m)    Physical Exam Vitals signs and nursing note reviewed.  Constitutional:      General: She is not in acute distress.    Appearance: Normal appearance. She is well-developed. She is not diaphoretic.  HENT:     Head: Normocephalic and atraumatic.     Nose: Nose normal.     Mouth/Throat:     Pharynx: No oropharyngeal exudate.  Eyes:     Conjunctiva/sclera: Conjunctivae normal.     Pupils: Pupils are equal, round, and reactive to light.  Neck:     Musculoskeletal: Normal range of motion and neck supple.     Thyroid:  No thyromegaly.     Vascular: No JVD.     Trachea: No tracheal deviation.  Cardiovascular:     Rate and Rhythm: Normal rate and regular rhythm.     Heart sounds: Normal heart sounds. No murmur. No friction rub. No gallop.   Pulmonary:     Effort: Pulmonary effort is normal. No respiratory distress.     Breath sounds: Normal breath sounds. No wheezing or rales.  Chest:     Chest wall: No tenderness.  Abdominal:     General: Bowel sounds are normal.     Palpations: Abdomen is soft.     Tenderness: There is no abdominal tenderness.  Musculoskeletal: Normal range of motion.  Lymphadenopathy:     Cervical: No cervical adenopathy.  Skin:    General: Skin is warm and dry.  Neurological:     Mental Status: She is alert and oriented to person, place, and time.     Cranial Nerves: No cranial nerve deficit.  Psychiatric:        Behavior: Behavior normal.        Thought Content: Thought content normal.        Judgment: Judgment normal.    Assessment/Plan: 1. Fatigue, unspecified type Likely from sleep hygeine issues and busy lifestyle. Will monitor.   2. Attention and concentration deficit May continue adderall 5mg  twice daily when needed for focus and concentration. Three 30 day prescriptions were sent to her  pharmacy. Dates are 02/24/2018, 03/25/2018, and 04/21/2018 - amphetamine-dextroamphetamine (ADDERALL) 5 MG tablet; Take one to 2 tabs as needed  Dispense: 60 tablet; Refill: 0  3. Primary insomnia May continue ambien at bedtime as needed.   General Counseling: Kaesha verbalizes understanding of the findings of todays visit and agrees with plan of treatment. I have discussed any further diagnostic evaluation that may be needed or ordered today. We also reviewed her medications today. she has been encouraged to call the office with any questions or concerns that should arise related to todays visit.  Refilled Controlled medications today. Reviewed risks and possible side effects associated with taking Stimulants. Combination of these drugs with other psychotropic medications could cause dizziness and drowsiness. Pt needs to Monitor symptoms and exercise caution in driving and operating heavy machinery to avoid damages to oneself, to others and to the surroundings. Patient verbalized understanding in this matter. Dependence and abuse for these drugs will be monitored closely. A Controlled substance policy and procedure is on file which allows [facility] to order a urine drug screen test at any visit. Patient understands and agrees with the plan..  This patient was seen by Leretha Pol FNP Collaboration with Dr Lavera Guise as a part of collaborative care agreement   Meds ordered this encounter  Medications  . DISCONTD: amphetamine-dextroamphetamine (ADDERALL) 5 MG tablet    Sig: Take one to 2 tabs as needed    Dispense:  60 tablet    Refill:  0    Order Specific Question:   Supervising Provider    Answer:   Lavera Guise [place]  . DISCONTD: amphetamine-dextroamphetamine (ADDERALL) 5 MG tablet    Sig: Take one to 2 tabs as needed    Dispense:  60 tablet    Refill:  0    Fill after 03/25/2018    Order Specific Question:   Supervising Provider    Answer:   Lavera Guise [place]  .  amphetamine-dextroamphetamine (ADDERALL) 5 MG tablet    Sig: Take one to  2 tabs as needed    Dispense:  60 tablet    Refill:  0    Fill after 04/21/2018    Order Specific Question:   Supervising Provider    Answer:   Lavera Guise [[id number]]    Time spent: 25 Minutes      Dr Lavera Guise Internal medicine

## 2018-03-13 DIAGNOSIS — Z1231 Encounter for screening mammogram for malignant neoplasm of breast: Secondary | ICD-10-CM | POA: Diagnosis not present

## 2018-04-14 ENCOUNTER — Telehealth: Payer: Self-pay

## 2018-04-14 NOTE — Telephone Encounter (Signed)
I did prescriptions for her adderall when she was here. She should have one that is to be filled 04/21/2018 which was sent to the pharmacy on the day she was seen. She may have to call the pharmacy to let them know she has the prescription on file.

## 2018-04-14 NOTE — Telephone Encounter (Signed)
Lmom that I spoke with phar pres for adderall is already there so just call phar

## 2018-04-29 DIAGNOSIS — M545 Low back pain: Secondary | ICD-10-CM | POA: Diagnosis not present

## 2018-05-28 ENCOUNTER — Other Ambulatory Visit: Payer: Self-pay

## 2018-05-28 ENCOUNTER — Ambulatory Visit: Payer: BC Managed Care – PPO | Admitting: Nurse Practitioner

## 2018-05-28 ENCOUNTER — Encounter: Payer: Self-pay | Admitting: Nurse Practitioner

## 2018-05-28 VITALS — Ht 71.0 in | Wt 219.0 lb

## 2018-05-28 DIAGNOSIS — R41840 Attention and concentration deficit: Secondary | ICD-10-CM

## 2018-05-28 DIAGNOSIS — F5101 Primary insomnia: Secondary | ICD-10-CM | POA: Diagnosis not present

## 2018-05-28 DIAGNOSIS — R5383 Other fatigue: Secondary | ICD-10-CM | POA: Diagnosis not present

## 2018-05-28 MED ORDER — AMPHETAMINE-DEXTROAMPHETAMINE 5 MG PO TABS: ORAL_TABLET | ORAL | 0 refills | Status: DC

## 2018-05-28 MED ORDER — ZOLPIDEM TARTRATE 5 MG PO TABS: ORAL_TABLET | ORAL | 2 refills | Status: DC

## 2018-05-28 NOTE — Progress Notes (Signed)
[facility] [place], [place] [address]  Internal MEDICINE  Telephone Visit  Patient Name: Deanna Sims  [id number]  [id number]  Date of Service: 06/22/2018  I connected with the patient at 11:15am by webcam and verified the patients identity using two identifiers.   I discussed the limitations, risks, security and privacy concerns of performing an evaluation and management service by webcam and the availability of in person appointments. I also discussed with the patient that there may be a patient responsible charge related to the service.  The patient expressed understanding and agrees to proceed.    Chief Complaint  Patient presents with  . Telephone Screen  . Telephone Assessment  . Medical Management of Chronic Issues    med refills     The patient has been contacted via webcam for follow up visit due to concerns for spread of novel coronavirus. She is feeling unfocused and very tired. She is taking adderall 5mg  when needed. She would like to continue on this. She is not breast feeding her infant. She had good results without negative side effects.  She would also like to have a refill of her [place]. She generally takes 1/2 tablet at night to help her sleep. She is able to wake up when needed by her newborn. Has no grogginess or residual sleepiness the next day.        Current Medication: Outpatient Encounter Medications as of 05/28/2018  Medication Sig  . amphetamine-dextroamphetamine (ADDERALL) 5 MG tablet Take one to 2 tabs as needed  . fluticasone (FLONASE) 50 MCG/ACT nasal spray Place 1 spray into both nostrils daily as needed for allergies or rhinitis.  Marland Kitchen ibuprofen (ADVIL,MOTRIN) 600 MG tablet Take 1 tablet (600 mg total) by mouth every 6 (six) hours.  Marland Kitchen zolpidem (AMBIEN) 5 MG tablet Take one tab po qhs as needed, may take one extra if needed  . [DISCONTINUED] amphetamine-dextroamphetamine (ADDERALL) 5 MG tablet Take one to 2 tabs as needed  .  [DISCONTINUED] amphetamine-dextroamphetamine (ADDERALL) 5 MG tablet Take one to 2 tabs as needed  . [DISCONTINUED] amphetamine-dextroamphetamine (ADDERALL) 5 MG tablet Take one to 2 tabs as needed  . [DISCONTINUED] zolpidem (AMBIEN) 5 MG tablet Take 1 tablet (5 mg total) by mouth at bedtime as needed for sleep. Take one tab po qhs as needed, may take one extra if needed   No facility-administered encounter medications on file as of 05/28/2018.     Surgical History: Past Surgical History:  Procedure Laterality Date  . CESAREAN SECTION N/A 08/11/2015   Procedure: Primary CESAREAN SECTION;  Surgeon: Brien Few, MD;  Location: [place];  Service: Obstetrics;  Laterality: N/A;  EDD: 08/17/15 Allergy: PCN  . CESAREAN SECTION N/A 08/14/2017   Procedure: Repeat CESAREAN SECTION;  Surgeon: Brien Few, MD;  Location: [place];  Service: Obstetrics;  Laterality: N/A;  EDD: 08/20/17 Allergy: Penicillin  . DILATION AND EVACUATION N/A 03/15/2014   Procedure: DILATATION AND EVACUATION;  Surgeon: Lyman Speller, MD;  Location: [facility];  Service: Gynecology;  Laterality: N/A;  . LAPAROSCOPY N/A 03/15/2014   Procedure: LAPAROSCOPY OPERATIVE;  Surgeon: Lyman Speller, MD;  Location: [facility];  Service: Gynecology;  Laterality: N/A;  . WISDOM TOOTH EXTRACTION      Medical History: Past Medical History:  Diagnosis Date  . Bell palsy   . Gestational hypertension   . Insomnia   . Postpartum care following repeat cesarean delivery (6/29) 08/12/2015  . Seasonal allergies   .  Sinusitis     Family History: Family History  Problem Relation Age of Onset  . Uterine cancer Mother 51  . Cancer - Ovarian Maternal Grandmother   . Cancer - Lung Maternal Grandmother   . Cancer - Lung Maternal Grandfather     Social History   Socioeconomic History  . Marital status: Married    Spouse name: Not on file  . Number of children: Not on file  . Years of education: Not on file  . Highest  education level: Not on file  Occupational History  . Not on file  Social Needs  . Financial resource strain: Not on file  . Food insecurity:    Worry: Not on file    Inability: Not on file  . Transportation needs:    Medical: Not on file    Non-medical: Not on file  Tobacco Use  . Smoking status: Former Smoker    Packs/day: 1.00    Years: 13.00    Pack years: 13.00    Types: Cigarettes    Last attempt to quit: 08/12/2013    Years since quitting: 4.8  . Smokeless tobacco: Never Used  Substance and Sexual Activity  . Alcohol use: Yes    Alcohol/week: 5.0 standard drinks    Types: 5 Glasses of wine per week    Comment: none with pregnancy  . Drug use: No  . Sexual activity: Yes    Partners: Male    Birth control/protection: None    Comment: approx 7-[redacted] wks gestation?? per patient  Lifestyle  . Physical activity:    Days per week: Not on file    Minutes per session: Not on file  . Stress: Not on file  Relationships  . Social connections:    Talks on phone: Not on file    Gets together: Not on file    Attends religious service: Not on file    Active member of club or organization: Not on file    Attends meetings of clubs or organizations: Not on file    Relationship status: Not on file  . Intimate partner violence:    Fear of current or ex partner: Not on file    Emotionally abused: Not on file    Physically abused: Not on file    Forced sexual activity: Not on file  Other Topics Concern  . Not on file  Social History Narrative  . Not on file      Review of Systems  Constitutional: Positive for fatigue. Negative for chills and unexpected weight change.  HENT: Negative for congestion, postnasal drip, rhinorrhea, sneezing and sore throat.   Respiratory: Negative for cough, chest tightness and shortness of breath.   Cardiovascular: Negative for chest pain and palpitations.  Gastrointestinal: Negative for abdominal pain, constipation, diarrhea, nausea and vomiting.   Endocrine: Negative for cold intolerance, heat intolerance, polydipsia and polyuria.  Musculoskeletal: Negative for arthralgias, back pain, joint swelling and neck pain.  Skin: Negative for rash.  Allergic/Immunologic: Negative for environmental allergies.  Neurological: Negative for dizziness, tremors, numbness and headaches.  Hematological: Negative for adenopathy. Does not bruise/bleed easily.  Psychiatric/Behavioral: Positive for decreased concentration and sleep disturbance. Negative for behavioral problems (Depression) and suicidal ideas. The patient is not nervous/anxious.     Today's Vitals   05/28/18 0958  Weight: 219 lb (99.3 kg)  Height: 5\' 11"  (1.803 m)   Body mass index is 30.54 kg/m.  Observation/Objective:   The patient is alert and oriented. She is pleasant and  answers all questions appropriately. Breathing is non-labored. She is in no acute distress at this time.    Assessment/Plan: 1. Fatigue, unspecified type Likely related to having two very young children and currently working from home while children are with her.   2. Attention and concentration deficit May continue adderall 5mg  twice daily as needed for focus and attention. Three 30 day prescriptions provided today. Dates are 05/27/2018, 07/25/2018, and 08/22/2018. - amphetamine-dextroamphetamine (ADDERALL) 5 MG tablet; Take one to 2 tabs as needed  Dispense: 60 tablet; Refill: 0  3. Primary insomnia May take ambien 5mg  at bedtime as needed to improve insomnia.  - zolpidem (AMBIEN) 5 MG tablet; Take one tab po qhs as needed, may take one extra if needed  Dispense: 35 tablet; Refill: 2  General Counseling: Onna verbalizes understanding of the findings of today's phone visit and agrees with plan of treatment. I have discussed any further diagnostic evaluation that may be needed or ordered today. We also reviewed her medications today. she has been encouraged to call the office with any questions or concerns  that should arise related to todays visit.  Refilled Controlled medications today. Reviewed risks and possible side effects associated with taking Stimulants. Combination of these drugs with other psychotropic medications could cause dizziness and drowsiness. Pt needs to Monitor symptoms and exercise caution in driving and operating heavy machinery to avoid damages to oneself, to others and to the surroundings. Patient verbalized understanding in this matter. Dependence and abuse for these drugs will be monitored closely. A Controlled substance policy and procedure is on file which allows [facility] to order a urine drug screen test at any visit. Patient understands and agrees with the plan..  This patient was seen by Leretha Pol FNP Collaboration with Dr Lavera Guise as a part of collaborative care agreement  Meds ordered this encounter  Medications  . DISCONTD: amphetamine-dextroamphetamine (ADDERALL) 5 MG tablet    Sig: Take one to 2 tabs as needed    Dispense:  60 tablet    Refill:  0    Order Specific Question:   Supervising Provider    Answer:   Lavera Guise [place]  . zolpidem (AMBIEN) 5 MG tablet    Sig: Take one tab po qhs as needed, may take one extra if needed    Dispense:  35 tablet    Refill:  2    Order Specific Question:   Supervising Provider    Answer:   Lavera Guise [place]  . DISCONTD: amphetamine-dextroamphetamine (ADDERALL) 5 MG tablet    Sig: Take one to 2 tabs as needed    Dispense:  60 tablet    Refill:  0    Fill after 06/25/2018    Order Specific Question:   Supervising Provider    Answer:   Lavera Guise [place]  . amphetamine-dextroamphetamine (ADDERALL) 5 MG tablet    Sig: Take one to 2 tabs as needed    Dispense:  60 tablet    Refill:  0    Fill after 07/24/2018    Order Specific Question:   Supervising Provider    Answer:   Lavera Guise [[id number]]    Time spent: 72 Minutes    Dr Lavera Guise Internal medicine

## 2018-06-03 ENCOUNTER — Ambulatory Visit: Payer: Self-pay | Admitting: Nurse Practitioner

## 2018-06-17 DIAGNOSIS — M531 Cervicobrachial syndrome: Secondary | ICD-10-CM | POA: Diagnosis not present

## 2018-06-17 DIAGNOSIS — M5386 Other specified dorsopathies, lumbar region: Secondary | ICD-10-CM | POA: Diagnosis not present

## 2018-06-17 DIAGNOSIS — M9902 Segmental and somatic dysfunction of thoracic region: Secondary | ICD-10-CM | POA: Diagnosis not present

## 2018-06-26 ENCOUNTER — Telehealth: Payer: Self-pay

## 2018-06-26 ENCOUNTER — Other Ambulatory Visit: Payer: Self-pay

## 2018-06-26 ENCOUNTER — Other Ambulatory Visit: Payer: Self-pay | Admitting: Nurse Practitioner

## 2018-06-26 DIAGNOSIS — R41840 Attention and concentration deficit: Secondary | ICD-10-CM

## 2018-06-26 DIAGNOSIS — M4722 Other spondylosis with radiculopathy, cervical region: Secondary | ICD-10-CM | POA: Diagnosis not present

## 2018-06-26 MED ORDER — AMPHETAMINE-DEXTROAMPHETAMINE 5 MG PO TABS: ORAL_TABLET | ORAL | 0 refills | Status: DC

## 2018-06-26 NOTE — Telephone Encounter (Signed)
Pt advised we send med and need follow up for next refills

## 2018-06-26 NOTE — Telephone Encounter (Signed)
Looks like I did send them on the day I saw her, however, I guess they didn't go through. Resent adderall prescriptions for 06/26/2018 and 07/24/2018 to [facility].

## 2018-06-26 NOTE — Progress Notes (Signed)
Resent adderall prescriptions for 06/26/2018 and 07/24/2018 to [facility].

## 2018-08-22 ENCOUNTER — Other Ambulatory Visit: Payer: Self-pay

## 2018-08-22 ENCOUNTER — Ambulatory Visit: Payer: BLUE CROSS/BLUE SHIELD | Admitting: Nurse Practitioner

## 2018-08-22 ENCOUNTER — Encounter: Payer: Self-pay | Admitting: Nurse Practitioner

## 2018-08-22 VITALS — BP 113/53 | HR 80 | Resp 16 | Ht 70.0 in | Wt 222.0 lb

## 2018-08-22 DIAGNOSIS — Z79899 Other long term (current) drug therapy: Secondary | ICD-10-CM | POA: Insufficient documentation

## 2018-08-22 DIAGNOSIS — R41840 Attention and concentration deficit: Secondary | ICD-10-CM | POA: Diagnosis not present

## 2018-08-22 DIAGNOSIS — F5101 Primary insomnia: Secondary | ICD-10-CM | POA: Diagnosis not present

## 2018-08-22 DIAGNOSIS — R5383 Other fatigue: Secondary | ICD-10-CM

## 2018-08-22 LAB — POCT URINE DRUG SCREEN
POC Amphetamine UR: POSITIVE — AB
POC BENZODIAZEPINES UR: NOT DETECTED
POC Barbiturate UR: NOT DETECTED
POC Cocaine UR: NOT DETECTED
POC Ecstasy UR: NOT DETECTED
POC Marijuana UR: NOT DETECTED
POC Methadone UR: NOT DETECTED
POC Methamphetamine UR: NOT DETECTED
POC Opiate Ur: NOT DETECTED
POC Oxycodone UR: NOT DETECTED
POC PHENCYCLIDINE UR: NOT DETECTED
POC TRICYCLICS UR: NOT DETECTED

## 2018-08-22 MED ORDER — ZOLPIDEM TARTRATE 5 MG PO TABS
ORAL_TABLET | ORAL | 2 refills | Status: DC
Start: 2018-08-22 — End: 2018-11-24

## 2018-08-22 MED ORDER — AMPHETAMINE-DEXTROAMPHETAMINE 5 MG PO TABS: ORAL_TABLET | ORAL | 0 refills | Status: DC

## 2018-08-22 MED ORDER — IBUPROFEN 600 MG PO TABS: 600.0000 mg | ORAL_TABLET | Freq: Four times a day (QID) | ORAL | 1 refills | Status: DC

## 2018-08-22 NOTE — Progress Notes (Signed)
[facility] [place], [place] [address]  Internal MEDICINE  Office Visit Note  Patient Name: Deanna Sims  [id number]  [id number]  Date of Service: 08/22/2018  Chief Complaint  Patient presents with  . Medication Refill    Adderall,ibuprofen and ambien  . Medical Management of Chronic Issues    The patient is here for routine follow up. She takes adderall 5mg  twice daily when needed. She has no concerns or complaints regarding this medication. She does need to have refills for this today. She also takes ambien 5mg  at bedtime to help her sleep. Works well without making her feel groggy or tired the next day.       Current Medication: Outpatient Encounter Medications as of 08/22/2018  Medication Sig  . amphetamine-dextroamphetamine (ADDERALL) 5 MG tablet Take one to 2 tabs as needed  . fluticasone (FLONASE) 50 MCG/ACT nasal spray Place 1 spray into both nostrils daily as needed for allergies or rhinitis.  Marland Kitchen ibuprofen (ADVIL,MOTRIN) 600 MG tablet Take 1 tablet (600 mg total) by mouth every 6 (six) hours.  Marland Kitchen zolpidem (AMBIEN) 5 MG tablet Take one tab po qhs as needed, may take one extra if needed  . [DISCONTINUED] amphetamine-dextroamphetamine (ADDERALL) 5 MG tablet Take one to 2 tabs as needed  . [DISCONTINUED] amphetamine-dextroamphetamine (ADDERALL) 5 MG tablet Take one to 2 tabs as needed  . [DISCONTINUED] amphetamine-dextroamphetamine (ADDERALL) 5 MG tablet Take one to 2 tabs as needed  . [DISCONTINUED] zolpidem (AMBIEN) 5 MG tablet Take one tab po qhs as needed, may take one extra if needed   No facility-administered encounter medications on file as of 08/22/2018.     Surgical History: Past Surgical History:  Procedure Laterality Date  . CESAREAN SECTION N/A 08/11/2015   Procedure: Primary CESAREAN SECTION;  Surgeon: Brien Few, MD;  Location: [facility];  Service: Obstetrics;  Laterality: N/A;  EDD: 08/17/15 Allergy: PCN  . CESAREAN  SECTION N/A 08/14/2017   Procedure: Repeat CESAREAN SECTION;  Surgeon: Brien Few, MD;  Location: [facility];  Service: Obstetrics;  Laterality: N/A;  EDD: 08/20/17 Allergy: Penicillin  . DILATION AND EVACUATION N/A 03/15/2014   Procedure: DILATATION AND EVACUATION;  Surgeon: Lyman Speller, MD;  Location: [facility];  Service: Gynecology;  Laterality: N/A;  . LAPAROSCOPY N/A 03/15/2014   Procedure: LAPAROSCOPY OPERATIVE;  Surgeon: Lyman Speller, MD;  Location: [facility];  Service: Gynecology;  Laterality: N/A;  . WISDOM TOOTH EXTRACTION      Medical History: Past Medical History:  Diagnosis Date  . Bell palsy   . Insomnia   . Postpartum care following repeat cesarean delivery (6/29) 08/12/2015  . Seasonal allergies   . Sinusitis     Family History: Family History  Problem Relation Age of Onset  . Uterine cancer Mother 68  . Cancer - Ovarian Maternal Grandmother   . Cancer - Lung Maternal Grandmother   . Cancer - Lung Maternal Grandfather     Social History   Socioeconomic History  . Marital status: Married    Spouse name: Not on file  . Number of children: Not on file  . Years of education: Not on file  . Highest education level: Not on file  Occupational History  . Not on file  Social Needs  . Financial resource strain: Not on file  . Food insecurity    Worry: Not on file    Inability: Not on file  . Transportation needs    Medical: Not on  file    Non-medical: Not on file  Tobacco Use  . Smoking status: Former Smoker    Packs/day: 1.00    Years: 13.00    Pack years: 13.00    Types: Cigarettes    Quit date: 08/12/2013    Years since quitting: 5.0  . Smokeless tobacco: Never Used  Substance and Sexual Activity  . Alcohol use: Yes    Alcohol/week: 5.0 standard drinks    Types: 5 Glasses of wine per week    Comment: none with pregnancy  . Drug use: No  . Sexual activity: Yes    Partners: Male    Birth control/protection: None    Comment: approx 7-[redacted]  wks gestation?? per patient  Lifestyle  . Physical activity    Days per week: Not on file    Minutes per session: Not on file  . Stress: Not on file  Relationships  . Social Herbalist on phone: Not on file    Gets together: Not on file    Attends religious service: Not on file    Active member of club or organization: Not on file    Attends meetings of clubs or organizations: Not on file    Relationship status: Not on file  . Intimate partner violence    Fear of current or ex partner: Not on file    Emotionally abused: Not on file    Physically abused: Not on file    Forced sexual activity: Not on file  Other Topics Concern  . Not on file  Social History Narrative  . Not on file      Review of Systems  Constitutional: Positive for fatigue. Negative for chills and unexpected weight change.  HENT: Negative for congestion, postnasal drip, rhinorrhea, sneezing and sore throat.   Respiratory: Negative for cough, chest tightness and shortness of breath.   Cardiovascular: Negative for chest pain and palpitations.  Gastrointestinal: Negative for abdominal pain, constipation, diarrhea, nausea and vomiting.  Endocrine: Negative for cold intolerance, heat intolerance, polydipsia and polyuria.  Musculoskeletal: Negative for arthralgias, back pain, joint swelling and neck pain.  Skin: Negative for rash.  Allergic/Immunologic: Negative for environmental allergies.  Neurological: Negative for dizziness, tremors, numbness and headaches.  Hematological: Negative for adenopathy. Does not bruise/bleed easily.  Psychiatric/Behavioral: Positive for decreased concentration and sleep disturbance. Negative for behavioral problems (Depression) and suicidal ideas. The patient is not nervous/anxious.    Today's Vitals   08/22/18 0853  BP: (!) 113/53  Pulse: 80  Resp: 16  SpO2: 96%  Weight: 222 lb (100.7 kg)  Height: 5\' 10"  (1.778 m)   Body mass index is 31.85 kg/m.  Physical  Exam Vitals signs and nursing note reviewed.  Constitutional:      General: She is not in acute distress.    Appearance: Normal appearance. She is well-developed. She is not diaphoretic.  HENT:     Head: Normocephalic and atraumatic.     Nose: Nose normal.     Mouth/Throat:     Pharynx: No oropharyngeal exudate.  Eyes:     Conjunctiva/sclera: Conjunctivae normal.     Pupils: Pupils are equal, round, and reactive to light.  Neck:     Musculoskeletal: Normal range of motion and neck supple.     Thyroid: No thyromegaly.     Vascular: No JVD.     Trachea: No tracheal deviation.  Cardiovascular:     Rate and Rhythm: Normal rate and regular rhythm.  Heart sounds: Normal heart sounds. No murmur. No friction rub. No gallop.   Pulmonary:     Effort: Pulmonary effort is normal. No respiratory distress.     Breath sounds: Normal breath sounds. No wheezing or rales.  Chest:     Chest wall: No tenderness.  Abdominal:     Palpations: Abdomen is soft.     Tenderness: There is no abdominal tenderness.  Musculoskeletal: Normal range of motion.  Lymphadenopathy:     Cervical: No cervical adenopathy.  Skin:    General: Skin is warm and dry.  Neurological:     Mental Status: She is alert and oriented to person, place, and time.     Cranial Nerves: No cranial nerve deficit.  Psychiatric:        Behavior: Behavior normal.        Thought Content: Thought content normal.        Judgment: Judgment normal.    Assessment/Plan: 1. Fatigue, unspecified type Due to work/family stress. Will monitor.   2. Attention and concentration deficit May continue adderall 5mg  twice daily as needed for focus. Three 30 day prescriptions sent to her pharmacy. Dates are 7/10/2020m, 09/20/2018, and 10/19/2018 - amphetamine-dextroamphetamine (ADDERALL) 5 MG tablet; Take one to 2 tabs as needed  Dispense: 60 tablet; Refill: 0  3. Primary insomnia May continue ambien 5mg , taking 1 to 2 tablets po qHS as needed.  Refills provided today.  - zolpidem (AMBIEN) 5 MG tablet; Take one tab po qhs as needed, may take one extra if needed  Dispense: 35 tablet; Refill: 2  4. Encounter for long-term (current) use of medications - POCT Urine Drug Screen appropriately positive for AMP only.   General Counseling: Chaniqua verbalizes understanding of the findings of todays visit and agrees with plan of treatment. I have discussed any further diagnostic evaluation that may be needed or ordered today. We also reviewed her medications today. she has been encouraged to call the office with any questions or concerns that should arise related to todays visit.  Refilled Controlled medications today. Reviewed risks and possible side effects associated with taking Stimulants. Combination of these drugs with other psychotropic medications could cause dizziness and drowsiness. Pt needs to Monitor symptoms and exercise caution in driving and operating heavy machinery to avoid damages to oneself, to others and to the surroundings. Patient verbalized understanding in this matter. Dependence and abuse for these drugs will be monitored closely. A Controlled substance policy and procedure is on file which allows [facility] to order a urine drug screen test at any visit. Patient understands and agrees with the plan..  This patient was seen by Leretha Pol FNP Collaboration with Dr Lavera Guise as a part of collaborative care agreement  Orders Placed This Encounter  Procedures  . POCT Urine Drug Screen    Meds ordered this encounter  Medications  . DISCONTD: amphetamine-dextroamphetamine (ADDERALL) 5 MG tablet    Sig: Take one to 2 tabs as needed    Dispense:  60 tablet    Refill:  0    Order Specific Question:   Supervising Provider    Answer:   Lavera Guise [place]  . zolpidem (AMBIEN) 5 MG tablet    Sig: Take one tab po qhs as needed, may take one extra if needed    Dispense:  35 tablet    Refill:  2    Order  Specific Question:   Supervising Provider    Answer:   Clayborn Bigness  M [[id number]]  . DISCONTD: amphetamine-dextroamphetamine (ADDERALL) 5 MG tablet    Sig: Take one to 2 tabs as needed    Dispense:  60 tablet    Refill:  0    Fill after 09/20/2018    Order Specific Question:   Supervising Provider    Answer:   Lavera Guise [place]  . amphetamine-dextroamphetamine (ADDERALL) 5 MG tablet    Sig: Take one to 2 tabs as needed    Dispense:  60 tablet    Refill:  0    Fill after 10/19/2018    Order Specific Question:   Supervising Provider    Answer:   Lavera Guise [[id number]]    Time spent: 25 Minutes      Dr Lavera Guise Internal medicine

## 2018-09-10 DIAGNOSIS — M5386 Other specified dorsopathies, lumbar region: Secondary | ICD-10-CM | POA: Diagnosis not present

## 2018-09-10 DIAGNOSIS — M531 Cervicobrachial syndrome: Secondary | ICD-10-CM | POA: Diagnosis not present

## 2018-09-10 DIAGNOSIS — M9902 Segmental and somatic dysfunction of thoracic region: Secondary | ICD-10-CM | POA: Diagnosis not present

## 2018-09-22 ENCOUNTER — Telehealth: Payer: Self-pay

## 2018-09-22 NOTE — Telephone Encounter (Signed)
Pt was notified.  

## 2018-09-22 NOTE — Telephone Encounter (Signed)
She should contact her pharmacy. I sent in two post dated prescriptions. One for august and one for September when she was seen. The pharmacy has to be called for them to look up prescription information.

## 2018-09-30 ENCOUNTER — Ambulatory Visit: Payer: BLUE CROSS/BLUE SHIELD | Admitting: Nurse Practitioner

## 2018-10-16 ENCOUNTER — Other Ambulatory Visit: Payer: Self-pay

## 2018-10-24 ENCOUNTER — Other Ambulatory Visit: Payer: Self-pay | Admitting: Nurse Practitioner

## 2018-10-24 DIAGNOSIS — I8393 Asymptomatic varicose veins of bilateral lower extremities: Secondary | ICD-10-CM

## 2018-10-31 ENCOUNTER — Other Ambulatory Visit: Payer: Self-pay

## 2018-10-31 ENCOUNTER — Ambulatory Visit (INDEPENDENT_AMBULATORY_CARE_PROVIDER_SITE_OTHER): Payer: BC Managed Care – PPO

## 2018-10-31 DIAGNOSIS — I8393 Asymptomatic varicose veins of bilateral lower extremities: Secondary | ICD-10-CM | POA: Diagnosis not present

## 2018-11-11 DIAGNOSIS — Z01419 Encounter for gynecological examination (general) (routine) without abnormal findings: Secondary | ICD-10-CM | POA: Diagnosis not present

## 2018-11-11 DIAGNOSIS — Z6830 Body mass index (BMI) 30.0-30.9, adult: Secondary | ICD-10-CM | POA: Diagnosis not present

## 2018-11-11 DIAGNOSIS — Z124 Encounter for screening for malignant neoplasm of cervix: Secondary | ICD-10-CM | POA: Diagnosis not present

## 2018-11-11 DIAGNOSIS — Z23 Encounter for immunization: Secondary | ICD-10-CM | POA: Diagnosis not present

## 2018-11-12 ENCOUNTER — Ambulatory Visit: Payer: BC Managed Care – PPO | Admitting: Nurse Practitioner

## 2018-11-12 ENCOUNTER — Encounter: Payer: Self-pay | Admitting: Nurse Practitioner

## 2018-11-12 ENCOUNTER — Other Ambulatory Visit: Payer: Self-pay

## 2018-11-12 VITALS — Ht 70.0 in | Wt 214.0 lb

## 2018-11-12 DIAGNOSIS — I8312 Varicose veins of left lower extremity with inflammation: Secondary | ICD-10-CM | POA: Diagnosis not present

## 2018-11-12 DIAGNOSIS — I8311 Varicose veins of right lower extremity with inflammation: Secondary | ICD-10-CM

## 2018-11-12 NOTE — Progress Notes (Signed)
[facility] [place], [place] [address]  Internal MEDICINE  Telephone Visit  Patient Name: Deanna Sims  [id number]  [id number]  Date of Service: 11/12/2018  I connected with the patient at 12:49pm by telephone and verified the patients identity using two identifiers.   I discussed the limitations, risks, security and privacy concerns of performing an evaluation and management service by telephone and the availability of in person appointments. I also discussed with the patient that there may be a patient responsible charge related to the service.  The patient expressed understanding and agrees to proceed.    Chief Complaint  Patient presents with  . Telephone Assessment  . Telephone Screen  . Follow-up    U/S    The patient has been contacted via telephone for follow up visit due to concerns for spread of novel coronavirus. Today, she is discussing issues with spider veins. They appear like bruises on both legs. A little tender. Wanted to see vein and vascular provider to have these evaluated and treated. Was told she needed to have ultrasound of the extremities and referral from primary care provider. She did have ultrasound of bilateral lower extremities which shows that all veins in the lower extremities are patent and there is no evidence of DVT. She does wear compression socks when working to help support the veins in her legs       Current Medication: Outpatient Encounter Medications as of 11/12/2018  Medication Sig  . amphetamine-dextroamphetamine (ADDERALL) 5 MG tablet Take one to 2 tabs as needed  . fluticasone (FLONASE) 50 MCG/ACT nasal spray Place 1 spray into both nostrils daily as needed for allergies or rhinitis.  Marland Kitchen ibuprofen (ADVIL) 600 MG tablet Take 1 tablet (600 mg total) by mouth every 6 (six) hours.  Marland Kitchen zolpidem (AMBIEN) 5 MG tablet Take one tab po qhs as needed, may take one extra if needed   No facility-administered encounter  medications on file as of 11/12/2018.     Surgical History: Past Surgical History:  Procedure Laterality Date  . CESAREAN SECTION N/A 08/11/2015   Procedure: Primary CESAREAN SECTION;  Surgeon: Brien Few, MD;  Location: [facility];  Service: Obstetrics;  Laterality: N/A;  EDD: 08/17/15 Allergy: PCN  . CESAREAN SECTION N/A 08/14/2017   Procedure: Repeat CESAREAN SECTION;  Surgeon: Brien Few, MD;  Location: [facility];  Service: Obstetrics;  Laterality: N/A;  EDD: 08/20/17 Allergy: Penicillin  . DILATION AND EVACUATION N/A 03/15/2014   Procedure: DILATATION AND EVACUATION;  Surgeon: Lyman Speller, MD;  Location: [facility];  Service: Gynecology;  Laterality: N/A;  . LAPAROSCOPY N/A 03/15/2014   Procedure: LAPAROSCOPY OPERATIVE;  Surgeon: Lyman Speller, MD;  Location: [facility];  Service: Gynecology;  Laterality: N/A;  . WISDOM TOOTH EXTRACTION      Medical History: Past Medical History:  Diagnosis Date  . Bell palsy   . Insomnia   . Postpartum care following repeat cesarean delivery (6/29) 08/12/2015  . Seasonal allergies   . Sinusitis     Family History: Family History  Problem Relation Age of Onset  . Uterine cancer Mother 18  . Cancer - Ovarian Maternal Grandmother   . Cancer - Lung Maternal Grandmother   . Cancer - Lung Maternal Grandfather     Social History   Socioeconomic History  . Marital status: Married    Spouse name: Not on file  . Number of children: Not on file  . Years of education: Not on file  .  Highest education level: Not on file  Occupational History  . Not on file  Social Needs  . Financial resource strain: Not on file  . Food insecurity    Worry: Not on file    Inability: Not on file  . Transportation needs    Medical: Not on file    Non-medical: Not on file  Tobacco Use  . Smoking status: Former Smoker    Packs/day: 1.00    Years: 13.00    Pack years: 13.00    Types: Cigarettes    Quit date: 08/12/2013    Years since  quitting: 5.2  . Smokeless tobacco: Never Used  Substance and Sexual Activity  . Alcohol use: Yes    Alcohol/week: 5.0 standard drinks    Types: 5 Glasses of wine per week    Comment: none with pregnancy  . Drug use: No  . Sexual activity: Yes    Partners: Male    Birth control/protection: None    Comment: approx 7-[redacted] wks gestation?? per patient  Lifestyle  . Physical activity    Days per week: Not on file    Minutes per session: Not on file  . Stress: Not on file  Relationships  . Social Herbalist on phone: Not on file    Gets together: Not on file    Attends religious service: Not on file    Active member of club or organization: Not on file    Attends meetings of clubs or organizations: Not on file    Relationship status: Not on file  . Intimate partner violence    Fear of current or ex partner: Not on file    Emotionally abused: Not on file    Physically abused: Not on file    Forced sexual activity: Not on file  Other Topics Concern  . Not on file  Social History Narrative  . Not on file      Review of Systems  Constitutional: Negative for chills, fatigue and unexpected weight change.  HENT: Negative for congestion, postnasal drip, rhinorrhea, sneezing and sore throat.   Respiratory: Negative for cough, chest tightness and shortness of breath.   Cardiovascular: Negative for chest pain and palpitations.       Varicose veins and spider veins in both lower extremities. Appear as though they are bruised. Tender to palpate.   Gastrointestinal: Negative for abdominal pain, constipation, diarrhea, nausea and vomiting.  Endocrine: Negative for cold intolerance, heat intolerance, polydipsia and polyuria.  Musculoskeletal: Negative for arthralgias, back pain, joint swelling and neck pain.  Skin: Negative for rash.  Allergic/Immunologic: Negative for environmental allergies.  Neurological: Negative for dizziness, tremors, numbness and headaches.  Hematological:  Negative for adenopathy. Does not bruise/bleed easily.  Psychiatric/Behavioral: Negative for behavioral problems (Depression), decreased concentration, sleep disturbance and suicidal ideas. The patient is not nervous/anxious.     Today's Vitals   11/12/18 1225  Weight: 214 lb (97.1 kg)  Height: 5\' 10"  (1.778 m)   Body mass index is 30.71 kg/m.  Observation/Objective:   The patient is alert and oriented. She is pleasant and answers all questions appropriately. Breathing is non-labored. She is in no acute distress at this time.    Assessment/Plan:  1. Varicose veins of both lower extremities with inflammation Ultrasound of bilateral lower extremities negative for DVT. Major vessels intact and patent. Refer to [facility] for further evaluation and treatment . - Ambulatory referral to Vascular Surgery  General Counseling: Roselle verbalizes understanding  of the findings of today's phone visit and agrees with plan of treatment. I have discussed any further diagnostic evaluation that may be needed or ordered today. We also reviewed her medications today. she has been encouraged to call the office with any questions or concerns that should arise related to todays visit.    Orders Placed This Encounter  Procedures  . Ambulatory referral to Vascular Surgery   This patient was seen by Leretha Pol FNP Collaboration with Dr Lavera Guise as a part of collaborative care agreement  Time spent: 25 Minutes    Dr Lavera Guise Internal medicine

## 2018-11-17 ENCOUNTER — Encounter (INDEPENDENT_AMBULATORY_CARE_PROVIDER_SITE_OTHER): Payer: BLUE CROSS/BLUE SHIELD | Admitting: Nurse Practitioner

## 2018-11-24 ENCOUNTER — Ambulatory Visit: Payer: BC Managed Care – PPO | Admitting: Nurse Practitioner

## 2018-11-24 ENCOUNTER — Encounter: Payer: Self-pay | Admitting: Nurse Practitioner

## 2018-11-24 ENCOUNTER — Other Ambulatory Visit: Payer: Self-pay

## 2018-11-24 VITALS — BP 114/76 | HR 71 | Temp 97.3°F | Resp 16 | Ht 70.0 in | Wt 220.0 lb

## 2018-11-24 DIAGNOSIS — F5101 Primary insomnia: Secondary | ICD-10-CM

## 2018-11-24 DIAGNOSIS — R5383 Other fatigue: Secondary | ICD-10-CM | POA: Diagnosis not present

## 2018-11-24 DIAGNOSIS — R41840 Attention and concentration deficit: Secondary | ICD-10-CM | POA: Diagnosis not present

## 2018-11-24 MED ORDER — ZOLPIDEM TARTRATE 5 MG PO TABS: ORAL_TABLET | ORAL | 2 refills | Status: DC

## 2018-11-24 MED ORDER — AMPHETAMINE-DEXTROAMPHETAMINE 5 MG PO TABS: ORAL_TABLET | ORAL | 0 refills | Status: DC

## 2018-11-24 NOTE — Progress Notes (Signed)
[facility] [place], [place] [address]  Internal MEDICINE  Office Visit Note  Patient Name: Deanna Sims  [id number]  [id number]  Date of Service: 11/24/2018  Chief Complaint  Patient presents with  . Follow-up  . Medication Refill    adderall and ambien     The patient is here for routine follow up. She takes adderall 5mg  twice daily when needed. She has no concerns or complaints regarding this medication. She does need to have refills for this today. She also takes [place] 5mg  at bedtime to help her sleep. Works well without making her feel groggy or tired the next day.       Current Medication: Outpatient Encounter Medications as of 11/24/2018  Medication Sig  . amphetamine-dextroamphetamine (ADDERALL) 5 MG tablet Take one to 2 tabs as needed  . fluticasone (FLONASE) 50 MCG/ACT nasal spray Place 1 spray into both nostrils daily as needed for allergies or rhinitis.  Marland Kitchen ibuprofen (ADVIL) 600 MG tablet Take 1 tablet (600 mg total) by mouth every 6 (six) hours.  Marland Kitchen zolpidem (AMBIEN) 5 MG tablet Take one tab po qhs as needed, may take one extra if needed  . [DISCONTINUED] amphetamine-dextroamphetamine (ADDERALL) 5 MG tablet Take one to 2 tabs as needed  . [DISCONTINUED] zolpidem (AMBIEN) 5 MG tablet Take one tab po qhs as needed, may take one extra if needed   No facility-administered encounter medications on file as of 11/24/2018.     Surgical History: Past Surgical History:  Procedure Laterality Date  . CESAREAN SECTION N/A 08/11/2015   Procedure: Primary CESAREAN SECTION;  Surgeon: Brien Few, MD;  Location: [place];  Service: Obstetrics;  Laterality: N/A;  EDD: 08/17/15 Allergy: PCN  . CESAREAN SECTION N/A 08/14/2017   Procedure: Repeat CESAREAN SECTION;  Surgeon: Brien Few, MD;  Location: [place];  Service: Obstetrics;  Laterality: N/A;  EDD: 08/20/17 Allergy: Penicillin  . DILATION AND EVACUATION N/A 03/15/2014   Procedure: DILATATION AND EVACUATION;  Surgeon: Lyman Speller, MD;  Location: [facility];  Service: Gynecology;  Laterality: N/A;  . LAPAROSCOPY N/A 03/15/2014   Procedure: LAPAROSCOPY OPERATIVE;  Surgeon: Lyman Speller, MD;  Location: [facility];  Service: Gynecology;  Laterality: N/A;  . WISDOM TOOTH EXTRACTION      Medical History: Past Medical History:  Diagnosis Date  . Bell palsy   . Insomnia   . Postpartum care following repeat cesarean delivery (6/29) 08/12/2015  . Seasonal allergies   . Sinusitis     Family History: Family History  Problem Relation Age of Onset  . Uterine cancer Mother 17  . Cancer - Ovarian Maternal Grandmother   . Cancer - Lung Maternal Grandmother   . Cancer - Lung Maternal Grandfather     Social History   Socioeconomic History  . Marital status: Married    Spouse name: Not on file  . Number of children: Not on file  . Years of education: Not on file  . Highest education level: Not on file  Occupational History  . Not on file  Social Needs  . Financial resource strain: Not on file  . Food insecurity    Worry: Not on file    Inability: Not on file  . Transportation needs    Medical: Not on file    Non-medical: Not on file  Tobacco Use  . Smoking status: Former Smoker    Packs/day: 1.00    Years: 13.00    Pack years: 13.00  Types: Cigarettes    Quit date: 08/12/2013    Years since quitting: 5.2  . Smokeless tobacco: Never Used  Substance and Sexual Activity  . Alcohol use: Yes    Alcohol/week: 5.0 standard drinks    Types: 5 Glasses of wine per week    Comment: ocassionally  . Drug use: No  . Sexual activity: Yes    Partners: Male    Birth control/protection: None    Comment: approx 7-[redacted] wks gestation?? per patient  Lifestyle  . Physical activity    Days per week: Not on file    Minutes per session: Not on file  . Stress: Not on file  Relationships  . Social Herbalist on phone: Not on file    Gets together:  Not on file    Attends religious service: Not on file    Active member of club or organization: Not on file    Attends meetings of clubs or organizations: Not on file    Relationship status: Not on file  . Intimate partner violence    Fear of current or ex partner: Not on file    Emotionally abused: Not on file    Physically abused: Not on file    Forced sexual activity: Not on file  Other Topics Concern  . Not on file  Social History Narrative  . Not on file      Review of Systems  Constitutional: Negative for chills, fatigue and unexpected weight change.  HENT: Negative for congestion, postnasal drip, rhinorrhea, sneezing and sore throat.   Respiratory: Negative for cough, chest tightness and shortness of breath.   Cardiovascular: Negative for chest pain and palpitations.  Gastrointestinal: Negative for abdominal pain, constipation, diarrhea, nausea and vomiting.  Endocrine: Negative for cold intolerance, heat intolerance, polydipsia and polyuria.  Musculoskeletal: Negative for arthralgias, back pain, joint swelling and neck pain.  Skin: Negative for rash.  Allergic/Immunologic: Negative for environmental allergies.  Neurological: Negative for dizziness, tremors, numbness and headaches.  Hematological: Negative for adenopathy. Does not bruise/bleed easily.  Psychiatric/Behavioral: Positive for decreased concentration and sleep disturbance. Negative for behavioral problems (Depression) and suicidal ideas. The patient is not nervous/anxious.    Today's Vitals   11/24/18 0855  BP: 114/76  Pulse: 71  Resp: 16  Temp: (!) 97.3 F (36.3 C)  SpO2: 98%  Weight: 220 lb (99.8 kg)  Height: 5\' 10"  (1.778 m)   Body mass index is 31.57 kg/m.  Physical Exam Vitals signs and nursing note reviewed.  Constitutional:      General: She is not in acute distress.    Appearance: Normal appearance. She is well-developed. She is not diaphoretic.  HENT:     Head: Normocephalic and  atraumatic.     Nose: Nose normal.     Mouth/Throat:     Pharynx: No oropharyngeal exudate.  Eyes:     Conjunctiva/sclera: Conjunctivae normal.     Pupils: Pupils are equal, round, and reactive to light.  Neck:     Musculoskeletal: Normal range of motion and neck supple.     Thyroid: No thyromegaly.     Vascular: No JVD.     Trachea: No tracheal deviation.  Cardiovascular:     Rate and Rhythm: Normal rate and regular rhythm.     Heart sounds: Normal heart sounds. No murmur. No friction rub. No gallop.   Pulmonary:     Effort: Pulmonary effort is normal. No respiratory distress.     Breath sounds: Normal  breath sounds. No wheezing or rales.  Chest:     Chest wall: No tenderness.  Abdominal:     Palpations: Abdomen is soft.     Tenderness: There is no abdominal tenderness.  Musculoskeletal: Normal range of motion.  Lymphadenopathy:     Cervical: No cervical adenopathy.  Skin:    General: Skin is warm and dry.  Neurological:     Mental Status: She is alert and oriented to person, place, and time.     Cranial Nerves: No cranial nerve deficit.  Psychiatric:        Behavior: Behavior normal.        Thought Content: Thought content normal.        Judgment: Judgment normal.    Assessment/Plan: 1. Fatigue, unspecified type Stable. Continue to monitor.   2. Attention and concentration deficit Doing well with current dose adderall 5mg  twice daily. Three 30 day prescriptions sent to her pharmacy. Dates are 11/24/2018, 12/23/2018, and 01/20/2019 - amphetamine-dextroamphetamine (ADDERALL) 5 MG tablet; Take one to 2 tabs as needed  Dispense: 60 tablet; Refill: 0  3. Primary insomnia May take ambien 5mg , 1 to 2 tablets at bedtime when needed. New prescription sent to her pharmacy  - zolpidem (AMBIEN) 5 MG tablet; Take one tab po qhs as needed, may take one extra if needed  Dispense: 45 tablet; Refill: 2  General Counseling: Yanely verbalizes understanding of the findings of todays  visit and agrees with plan of treatment. I have discussed any further diagnostic evaluation that may be needed or ordered today. We also reviewed her medications today. she has been encouraged to call the office with any questions or concerns that should arise related to todays visit.   Refilled Controlled medications today. Reviewed risks and possible side effects associated with taking Stimulants. Combination of these drugs with other psychotropic medications could cause dizziness and drowsiness. Pt needs to Monitor symptoms and exercise caution in driving and operating heavy machinery to avoid damages to oneself, to others and to the surroundings. Patient verbalized understanding in this matter. Dependence and abuse for these drugs will be monitored closely. A Controlled substance policy and procedure is on file which allows [facility] to order a urine drug screen test at any visit. Patient understands and agrees with the plan..  This patient was seen by Leretha Pol FNP Collaboration with Dr Lavera Guise as a part of collaborative care agreement  Meds ordered this encounter  Medications  . amphetamine-dextroamphetamine (ADDERALL) 5 MG tablet    Sig: Take one to 2 tabs as needed    Dispense:  60 tablet    Refill:  0    Order Specific Question:   Supervising Provider    Answer:   Lavera Guise [place]  . zolpidem (AMBIEN) 5 MG tablet    Sig: Take one tab po qhs as needed, may take one extra if needed    Dispense:  45 tablet    Refill:  2    Order Specific Question:   Supervising Provider    Answer:   Lavera Guise [[id number]]    Time spent: 20 Minutes      Dr Lavera Guise Internal medicine

## 2018-12-09 ENCOUNTER — Ambulatory Visit (INDEPENDENT_AMBULATORY_CARE_PROVIDER_SITE_OTHER): Payer: BC Managed Care – PPO | Admitting: Nurse Practitioner

## 2018-12-09 ENCOUNTER — Encounter (INDEPENDENT_AMBULATORY_CARE_PROVIDER_SITE_OTHER): Payer: Self-pay | Admitting: Nurse Practitioner

## 2018-12-09 ENCOUNTER — Other Ambulatory Visit: Payer: Self-pay

## 2018-12-09 VITALS — BP 122/84 | HR 73 | Resp 17 | Ht 70.0 in | Wt 219.0 lb

## 2018-12-09 DIAGNOSIS — I8312 Varicose veins of left lower extremity with inflammation: Secondary | ICD-10-CM

## 2018-12-09 DIAGNOSIS — I8311 Varicose veins of right lower extremity with inflammation: Secondary | ICD-10-CM

## 2018-12-09 NOTE — Progress Notes (Signed)
SUBJECTIVE:  Patient ID: Deanna Sims, female    DOB: Oct 24, 1975, 43 y.o.   MRN: [id number] Chief Complaint  Patient presents with  . New Patient (Initial Visit)    varicose veins    HPI  Deanna Sims is a 43 y.o. female The patient is seen for evaluation of symptomatic varicose veins. The patient relates burning and stinging which worsened steadily throughout the course of the day, particularly with standing. The patient also notes an aching and throbbing pain over the varicosities, particularly with prolonged dependent positions. The symptoms are significantly improved with elevation.  The patient also notes that during hot weather the symptoms are greatly intensified. The patient states the pain from the varicose veins interferes with work, daily exercise, shopping and household maintenance. At this point, the symptoms are persistent and severe enough that they're having a negative impact on lifestyle and are interfering with daily activities.  There is no history of DVT, PE or superficial thrombophlebitis. There is no history of ulceration or hemorrhage. The patient denies a significant family history of varicose veins. OB history: [id number]  The patient has not worn graduated compression in the past. At the present time the patient has not been using over-the-counter analgesics. The patient has had sclerotherapy previously and she is not certain if she has had any laser intervention.    Past Medical History:  Diagnosis Date  . Bell palsy   . Insomnia   . Postpartum care following repeat cesarean delivery (6/29) 08/12/2015  . Seasonal allergies   . Sinusitis     Past Surgical History:  Procedure Laterality Date  . CESAREAN SECTION N/A 08/11/2015   Procedure: Primary CESAREAN SECTION;  Surgeon: Brien Few, MD;  Location: [place];  Service: Obstetrics;  Laterality: N/A;  EDD: 08/17/15 Allergy: PCN  . CESAREAN SECTION N/A 08/14/2017   Procedure: Repeat CESAREAN  SECTION;  Surgeon: Brien Few, MD;  Location: [place];  Service: Obstetrics;  Laterality: N/A;  EDD: 08/20/17 Allergy: Penicillin  . DILATION AND EVACUATION N/A 03/15/2014   Procedure: DILATATION AND EVACUATION;  Surgeon: Lyman Speller, MD;  Location: [facility];  Service: Gynecology;  Laterality: N/A;  . LAPAROSCOPY N/A 03/15/2014   Procedure: LAPAROSCOPY OPERATIVE;  Surgeon: Lyman Speller, MD;  Location: [facility];  Service: Gynecology;  Laterality: N/A;  . WISDOM TOOTH EXTRACTION      Social History   Socioeconomic History  . Marital status: Married    Spouse name: Not on file  . Number of children: Not on file  . Years of education: Not on file  . Highest education level: Not on file  Occupational History  . Not on file  Social Needs  . Financial resource strain: Not on file  . Food insecurity    Worry: Not on file    Inability: Not on file  . Transportation needs    Medical: Not on file    Non-medical: Not on file  Tobacco Use  . Smoking status: Former Smoker    Packs/day: 1.00    Years: 13.00    Pack years: 13.00    Types: Cigarettes    Quit date: 08/12/2013    Years since quitting: 5.3  . Smokeless tobacco: Never Used  Substance and Sexual Activity  . Alcohol use: Yes    Alcohol/week: 5.0 standard drinks    Types: 5 Glasses of wine per week    Comment: ocassionally  . Drug use: No  . Sexual activity: Yes  Partners: Male    Birth control/protection: None    Comment: approx 7-[redacted] wks gestation?? per patient  Lifestyle  . Physical activity    Days per week: Not on file    Minutes per session: Not on file  . Stress: Not on file  Relationships  . Social Herbalist on phone: Not on file    Gets together: Not on file    Attends religious service: Not on file    Active member of club or organization: Not on file    Attends meetings of clubs or organizations: Not on file    Relationship status: Not on file  . Intimate partner violence     Fear of current or ex partner: Not on file    Emotionally abused: Not on file    Physically abused: Not on file    Forced sexual activity: Not on file  Other Topics Concern  . Not on file  Social History Narrative  . Not on file    Family History  Problem Relation Age of Onset  . Uterine cancer Mother 68  . Cancer - Ovarian Maternal Grandmother   . Cancer - Lung Maternal Grandmother   . Cancer - Lung Maternal Grandfather     Allergies  Allergen Reactions  . Penicillins     Childhood reaction - Unknown reaction Has patient had a PCN reaction causing immediate rash, facial/tongue/throat swelling, SOB or lightheadedness with hypotension: unknown Has patient hadCN reaction causing severe rash involving mucus membranes or skin necrosis:unknown Has patient had a PCN reaction that required hospitalization unknown Has patient had a PCN reaction occurring within the last 10 years: No If all of the above answers are "NO", then may proceed with Cephalosporin use.      Review of Systems   Review of Systems: Negative Unless Checked Constitutional: [] Weight loss  [] Fever  [] Chills Cardiac: [] Chest pain   []  Atrial Fibrillation  [] Palpitations   [] Shortness of breath when laying flat   [] Shortness of breath with exertion. [] Shortness of breath at rest Vascular:  [] Pain in legs with walking   [] Pain in legs with standing [] Pain in legs when laying flat   [] Claudication    [] Pain in feet when laying flat    [] History of DVT   [] Phlebitis   [x] Swelling in legs   [x] Varicose veins   [] Non-healing ulcers Pulmonary:   [] Uses home oxygen   [] Productive cough   [] Hemoptysis   [] Wheeze  [] COPD   [] Asthma Neurologic:  [] Dizziness   [] Seizures  [] Blackouts [] History of stroke   [] History of TIA  [] Aphasia   [] Temporary Blindness   [] Weakness or numbness in arm   [] Weakness or numbness in leg Musculoskeletal:   [] Joint swelling   [] Joint pain   [] Low back pain  []  History of Knee Replacement [] Arthritis  [] back Surgeries  []  Spinal Stenosis    Hematologic:  [] Easy bruising  [] Easy bleeding   [] Hypercoagulable state   [] Anemic Gastrointestinal:  [] Diarrhea   [] Vomiting  [] Gastroesophageal reflux/heartburn   [] Difficulty swallowing. [] Abdominal pain Genitourinary:  [] Chronic kidney disease   [] Difficult urination  [] Anuric   [] Blood in urine [] Frequent urination  [] Burning with urination   [] Hematuria Skin:  [] Rashes   [] Ulcers [] Wounds Psychological:  [] History of anxiety   []  History of major depression  []  Memory Difficulties      OBJECTIVE:   Physical Exam  BP 122/84 (BP Location: Right Arm)   Pulse 73   Resp 17   Ht  5\' 10"  (1.778 m)   Wt 219 lb (99.3 kg)   BMI 31.42 kg/m   Gen: WD/WN, NAD Head: [place]/AT, No temporalis wasting.  Ear/Nose/Throat: Hearing grossly intact, nares w/o erythema or drainage Eyes: PER, EOMI, sclera nonicteric.  Neck: Supple, no masses.  No JVD.  Pulmonary:  Good air movement, no use of accessory muscles.  Cardiac: RRR Vascular: scattered varicose veins bilaterally, 1+ edema Vessel Right Left  Radial Palpable Palpable  Dorsalis Pedis Palpable Palpable  Posterior Tibial Palpable Palpable   Gastrointestinal: soft, non-distended. No guarding/no peritoneal signs.  Musculoskeletal: M/S 5/5 throughout.  No deformity or atrophy.  Neurologic: Pain and light touch intact in extremities.  Symmetrical.  Speech is fluent. Motor exam as listed above. Psychiatric: Judgment intact, Mood & affect appropriate for pt's clinical situation. Dermatologic: No Venous rashes. No Ulcers Noted.  No changes consistent with cellulitis. Lymph : No Cervical lymphadenopathy, no lichenification or skin changes of chronic lymphedema.       ASSESSMENT AND PLAN:  1. Varicose veins of both lower extremities with inflammation  Recommend:  The patient has large symptomatic varicose veins that are painful and associated with swelling.  I have had a long discussion with the patient  regarding  varicose veins and why they cause symptoms.  Patient will begin wearing graduated compression stockings class 1 on a daily basis, beginning first thing in the morning and removing them in the evening. The patient is instructed specifically not to sleep in the stockings.    The patient  will also begin using over-the-counter analgesics such as Motrin 600 mg po TID to help control the symptoms.    In addition, behavioral modification including elevation during the day will be initiated.    Pending the results of these changes the  patient will be reevaluated in three months.   An  ultrasound of the venous system will be obtained.   Further plans will be based on the ultrasound results and whether conservative therapies are successful at eliminating the pain and swelling.   Current Outpatient Medications on File Prior to Visit  Medication Sig Dispense Refill  . amphetamine-dextroamphetamine (ADDERALL) 5 MG tablet Take one to 2 tabs as needed 60 tablet 0  . fluticasone (FLONASE) 50 MCG/ACT nasal spray Place 1 spray into both nostrils daily as needed for allergies or rhinitis.    Marland Kitchen ibuprofen (ADVIL) 600 MG tablet Take 1 tablet (600 mg total) by mouth every 6 (six) hours. 30 tablet 1  . zolpidem (AMBIEN) 5 MG tablet Take one tab po qhs as needed, may take one extra if needed 45 tablet 2  . SPRINTEC 28 0.25-35 MG-MCG tablet Take 1 tablet by mouth daily.     No current facility-administered medications on file prior to visit.     There are no Patient Instructions on file for this visit. No follow-ups on file.   Kris Hartmann, NP  This note was completed with Sales executive.  Any errors are purely unintentional.

## 2018-12-23 ENCOUNTER — Other Ambulatory Visit (INDEPENDENT_AMBULATORY_CARE_PROVIDER_SITE_OTHER): Payer: Self-pay | Admitting: Nurse Practitioner

## 2018-12-23 DIAGNOSIS — I8311 Varicose veins of right lower extremity with inflammation: Secondary | ICD-10-CM

## 2018-12-23 DIAGNOSIS — I83813 Varicose veins of bilateral lower extremities with pain: Secondary | ICD-10-CM

## 2018-12-25 ENCOUNTER — Encounter (INDEPENDENT_AMBULATORY_CARE_PROVIDER_SITE_OTHER): Payer: Self-pay | Admitting: Nurse Practitioner

## 2018-12-25 ENCOUNTER — Other Ambulatory Visit: Payer: Self-pay

## 2018-12-25 ENCOUNTER — Ambulatory Visit (INDEPENDENT_AMBULATORY_CARE_PROVIDER_SITE_OTHER): Payer: BC Managed Care – PPO

## 2018-12-25 ENCOUNTER — Ambulatory Visit (INDEPENDENT_AMBULATORY_CARE_PROVIDER_SITE_OTHER): Payer: BC Managed Care – PPO | Admitting: Nurse Practitioner

## 2018-12-25 VITALS — BP 114/76 | HR 79 | Resp 16 | Wt 220.8 lb

## 2018-12-25 DIAGNOSIS — I8311 Varicose veins of right lower extremity with inflammation: Secondary | ICD-10-CM

## 2018-12-25 DIAGNOSIS — I8312 Varicose veins of left lower extremity with inflammation: Secondary | ICD-10-CM

## 2018-12-28 ENCOUNTER — Encounter (INDEPENDENT_AMBULATORY_CARE_PROVIDER_SITE_OTHER): Payer: Self-pay | Admitting: Nurse Practitioner

## 2018-12-28 NOTE — Progress Notes (Signed)
SUBJECTIVE:  Patient ID: Deanna Sims, female    DOB: Dec 20, 1975, 43 y.o.   MRN: [id number] Chief Complaint  Patient presents with  . Follow-up    ultrasound follow up    HPI  Deanna Sims is a 43 y.o. female The patient returns for followup evaluation  after the initial visit. The patient continues to have pain in the lower extremities with dependency. The pain is lessened with elevation. Graduated compression stockings, Class I (20-30 mmHg), have been worn but the stockings do not eliminate the leg pain. Over-the-counter analgesics do not improve the symptoms. The degree of discomfort continues to interfere with daily activities. The patient notes the pain in the legs is causing problems with daily exercise, at the workplace and even with household activities and maintenance such as standing in the kitchen preparing meals and doing dishes.   Venous ultrasound shows normal deep venous system, no evidence of acute or chronic DVT.  Noninvasive studies in previous evidence of a left lower extremity great saphenous vein ablation.  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  Past Medical History:  Diagnosis Date  . Bell palsy   . Insomnia   . Postpartum care following repeat cesarean delivery (6/29) 08/12/2015  . Seasonal allergies   . Sinusitis     Past Surgical History:  Procedure Laterality Date  . CESAREAN SECTION N/A 08/11/2015   Procedure: Primary CESAREAN SECTION;  Surgeon: Brien Few, MD;  Location: [facility];  Service: Obstetrics;  Laterality: N/A;  EDD: 08/17/15 Allergy: PCN  . CESAREAN SECTION N/A 08/14/2017   Procedure: Repeat CESAREAN SECTION;  Surgeon: Brien Few, MD;  Location: [facility];  Service: Obstetrics;  Laterality: N/A;  EDD: 08/20/17 Allergy: Penicillin  . DILATION AND EVACUATION N/A 03/15/2014   Procedure: DILATATION AND EVACUATION;  Surgeon: Lyman Speller, MD;  Location: [facility];  Service: Gynecology;  Laterality:  N/A;  . LAPAROSCOPY N/A 03/15/2014   Procedure: LAPAROSCOPY OPERATIVE;  Surgeon: Lyman Speller, MD;  Location: [facility];  Service: Gynecology;  Laterality: N/A;  . WISDOM TOOTH EXTRACTION      Social History   Socioeconomic History  . Marital status: Married    Spouse name: Not on file  . Number of children: Not on file  . Years of education: Not on file  . Highest education level: Not on file  Occupational History  . Not on file  Social Needs  . Financial resource strain: Not on file  . Food insecurity    Worry: Not on file    Inability: Not on file  . Transportation needs    Medical: Not on file    Non-medical: Not on file  Tobacco Use  . Smoking status: Former Smoker    Packs/day: 1.00    Years: 13.00    Pack years: 13.00    Types: Cigarettes    Quit date: 08/12/2013    Years since quitting: 5.3  . Smokeless tobacco: Never Used  Substance and Sexual Activity  . Alcohol use: Yes    Alcohol/week: 5.0 standard drinks    Types: 5 Glasses of wine per week    Comment: ocassionally  . Drug use: No  . Sexual activity: Yes    Partners: Male    Birth control/protection: None    Comment: approx 7-[redacted] wks gestation?? per patient  Lifestyle  . Physical activity    Days per week: Not on file    Minutes per session: Not on file  .  Stress: Not on file  Relationships  . Social Herbalist on phone: Not on file    Gets together: Not on file    Attends religious service: Not on file    Active member of club or organization: Not on file    Attends meetings of clubs or organizations: Not on file    Relationship status: Not on file  . Intimate partner violence    Fear of current or ex partner: Not on file    Emotionally abused: Not on file    Physically abused: Not on file    Forced sexual activity: Not on file  Other Topics Concern  . Not on file  Social History Narrative  . Not on file    Family History  Problem Relation Age of Onset  . Uterine cancer Mother  4  . Cancer - Ovarian Maternal Grandmother   . Cancer - Lung Maternal Grandmother   . Cancer - Lung Maternal Grandfather     Allergies  Allergen Reactions  . Penicillins     Childhood reaction - Unknown reaction Has patient had a PCN reaction causing immediate rash, facial/tongue/throat swelling, SOB or lightheadedness with hypotension: unknown Has patient hadCN reaction causing severe rash involving mucus membranes or skin necrosis:unknown Has patient had a PCN reaction that required hospitalization unknown Has patient had a PCN reaction occurring within the last 10 years: No If all of the above answers are "NO", then may proceed with Cephalosporin use.      Review of Systems   Review of Systems: Negative Unless Checked Constitutional: [] Weight loss  [] Fever  [] Chills Cardiac: [] Chest pain   []  Atrial Fibrillation  [] Palpitations   [] Shortness of breath when laying flat   [] Shortness of breath with exertion. [] Shortness of breath at rest Vascular:  [] Pain in legs with walking   [] Pain in legs with standing [] Pain in legs when laying flat   [] Claudication    [] Pain in feet when laying flat    [] History of DVT   [] Phlebitis   [x] Swelling in legs   [x] Varicose veins   [] Non-healing ulcers Pulmonary:   [] Uses home oxygen   [] Productive cough   [] Hemoptysis   [] Wheeze  [] COPD   [] Asthma Neurologic:  [] Dizziness   [] Seizures  [] Blackouts [] History of stroke   [] History of TIA  [] Aphasia   [] Temporary Blindness   [] Weakness or numbness in arm   [] Weakness or numbness in leg Musculoskeletal:   [] Joint swelling   [] Joint pain   [] Low back pain  []  History of Knee Replacement [] Arthritis [] back Surgeries  []  Spinal Stenosis    Hematologic:  [] Easy bruising  [] Easy bleeding   [] Hypercoagulable state   [] Anemic Gastrointestinal:  [] Diarrhea   [] Vomiting  [] Gastroesophageal reflux/heartburn   [] Difficulty swallowing. [] Abdominal pain Genitourinary:  [] Chronic kidney disease   [] Difficult urination   [] Anuric   [] Blood in urine [] Frequent urination  [] Burning with urination   [] Hematuria Skin:  [x] Rashes   [] Ulcers [] Wounds Psychological:  [] History of anxiety   []  History of major depression  []  Memory Difficulties      OBJECTIVE:   Physical Exam  BP 114/76 (BP Location: Right Arm)   Pulse 79   Resp 16   Wt 220 lb 12.8 oz (100.2 kg)   BMI 31.68 kg/m   Gen: WD/WN, NAD Head: [place]/AT, No temporalis wasting.  Ear/Nose/Throat: Hearing grossly intact, nares w/o erythema or drainage Eyes: PER, EOMI, sclera nonicteric.  Neck: Supple, no masses.  No JVD.  Pulmonary:  Good air movement, no use of accessory muscles.  Cardiac: RRR Vascular:  Mild stasis dermatitis bilaterally.  Scattered varicosities bilaterally Vessel Right Left  Radial Palpable Palpable  Dorsalis Pedis Palpable Palpable  Posterior Tibial Palpable Palpable   Gastrointestinal: soft, non-distended. No guarding/no peritoneal signs.  Musculoskeletal: M/S 5/5 throughout.  No deformity or atrophy.  Neurologic: Pain and light touch intact in extremities.  Symmetrical.  Speech is fluent. Motor exam as listed above. Psychiatric: Judgment intact, Mood & affect appropriate for pt's clinical situation. Dermatologic: . No Ulcers Noted.  No changes consistent with cellulitis. Lymph : No Cervical lymphadenopathy, no lichenification or skin changes of chronic lymphedema.       ASSESSMENT AND PLAN:  1. Varicose veins of both lower extremities with inflammation Recommend:  The patient has had successful ablation of the previously incompetent saphenous venous system but still has persistent symptoms of pain and swelling that are having a negative impact on daily life and daily activities.  Patient should undergo injection sclerotherapy to treat the residual varicosities.  The risks, benefits and alternative therapies were reviewed in detail with the patient.  All questions were answered.  The patient agrees to proceed with  sclerotherapy at their convenience.  The patient will continue wearing the graduated compression stockings and using the over-the-counter pain medications to treat her symptoms.        Current Outpatient Medications on File Prior to Visit  Medication Sig Dispense Refill  . amphetamine-dextroamphetamine (ADDERALL) 5 MG tablet Take one to 2 tabs as needed 60 tablet 0  . fluticasone (FLONASE) 50 MCG/ACT nasal spray Place 1 spray into both nostrils daily as needed for allergies or rhinitis.    Marland Kitchen ibuprofen (ADVIL) 600 MG tablet Take 1 tablet (600 mg total) by mouth every 6 (six) hours. 30 tablet 1  . SPRINTEC 28 0.25-35 MG-MCG tablet Take 1 tablet by mouth daily.    Marland Kitchen zolpidem (AMBIEN) 5 MG tablet Take one tab po qhs as needed, may take one extra if needed 45 tablet 2   No current facility-administered medications on file prior to visit.     There are no Patient Instructions on file for this visit. No follow-ups on file.   Kris Hartmann, NP  This note was completed with Sales executive.  Any errors are purely unintentional.

## 2018-12-31 DIAGNOSIS — M9902 Segmental and somatic dysfunction of thoracic region: Secondary | ICD-10-CM | POA: Diagnosis not present

## 2018-12-31 DIAGNOSIS — M531 Cervicobrachial syndrome: Secondary | ICD-10-CM | POA: Diagnosis not present

## 2018-12-31 DIAGNOSIS — M5387 Other specified dorsopathies, lumbosacral region: Secondary | ICD-10-CM | POA: Diagnosis not present

## 2019-01-12 ENCOUNTER — Encounter: Payer: Self-pay | Admitting: Internal Medicine

## 2019-01-12 ENCOUNTER — Other Ambulatory Visit: Payer: Self-pay

## 2019-01-12 ENCOUNTER — Ambulatory Visit: Payer: BC Managed Care – PPO | Admitting: Internal Medicine

## 2019-01-12 VITALS — Temp 97.8°F | Ht 70.0 in | Wt 220.0 lb

## 2019-01-12 DIAGNOSIS — F5101 Primary insomnia: Secondary | ICD-10-CM | POA: Diagnosis not present

## 2019-01-12 DIAGNOSIS — R5383 Other fatigue: Secondary | ICD-10-CM | POA: Diagnosis not present

## 2019-01-12 NOTE — Progress Notes (Signed)
[facility], [place] [address]  Internal MEDICINE  Telephone Visit  Patient Name: Deanna Sims  [id number]  [id number]  Date of Service: 01/15/2019  I connected with the patient at 1030 ( Virtual) and verified the patients identity using two identifiers.   I discussed the limitations, risks, security and privacy concerns of performing an evaluation and management service by telephone and the availability of in person appointments. I also discussed with the patient that there may be a patient responsible charge related to the service.  The patient expressed understanding and agrees to proceed.    Chief Complaint  Patient presents with  . Telephone Assessment  . Telephone Screen  . Sore Throat  . Headache    HPI C/o feeling tired x 2 days, slight sore throat, slight headache. Pt c/o being sick like this once or twice a year. She denies any fever or chills, no exposure to Covid 19. Previous labs do show abnormal platelets. She takes Adderall and Ambien.   Current Medication: Outpatient Encounter Medications as of 01/12/2019  Medication Sig  . amphetamine-dextroamphetamine (ADDERALL) 5 MG tablet Take one to 2 tabs as needed  . fluticasone (FLONASE) 50 MCG/ACT nasal spray Place 1 spray into both nostrils daily as needed for allergies or rhinitis.  Marland Kitchen ibuprofen (ADVIL) 600 MG tablet Take 1 tablet (600 mg total) by mouth every 6 (six) hours.  . SPRINTEC 28 0.25-35 MG-MCG tablet Take 1 tablet by mouth daily.  Marland Kitchen zolpidem (AMBIEN) 5 MG tablet Take one tab po qhs as needed, may take one extra if needed   No facility-administered encounter medications on file as of 01/12/2019.     Surgical History: Past Surgical History:  Procedure Laterality Date  . CESAREAN SECTION N/A 08/11/2015   Procedure: Primary CESAREAN SECTION;  Surgeon: Brien Few, MD;  Location: [facility];  Service: Obstetrics;  Laterality: N/A;  EDD: 08/17/15 Allergy: PCN  .  CESAREAN SECTION N/A 08/14/2017   Procedure: Repeat CESAREAN SECTION;  Surgeon: Brien Few, MD;  Location: [facility];  Service: Obstetrics;  Laterality: N/A;  EDD: 08/20/17 Allergy: Penicillin  . DILATION AND EVACUATION N/A 03/15/2014   Procedure: DILATATION AND EVACUATION;  Surgeon: Lyman Speller, MD;  Location: [facility];  Service: Gynecology;  Laterality: N/A;  . LAPAROSCOPY N/A 03/15/2014   Procedure: LAPAROSCOPY OPERATIVE;  Surgeon: Lyman Speller, MD;  Location: [facility];  Service: Gynecology;  Laterality: N/A;  . WISDOM TOOTH EXTRACTION      Medical History: Past Medical History:  Diagnosis Date  . Bell palsy   . Insomnia   . Postpartum care following repeat cesarean delivery (6/29) 08/12/2015  . Seasonal allergies   . Sinusitis     Family History: Family History  Problem Relation Age of Onset  . Uterine cancer Mother 77  . Cancer - Ovarian Maternal Grandmother   . Cancer - Lung Maternal Grandmother   . Cancer - Lung Maternal Grandfather     Social History   Socioeconomic History  . Marital status: Married    Spouse name: Not on file  . Number of children: Not on file  . Years of education: Not on file  . Highest education level: Not on file  Occupational History  . Not on file  Social Needs  . Financial resource strain: Not on file  . Food insecurity    Worry: Not on file    Inability: Not on file  . Transportation needs    Medical:  Not on file    Non-medical: Not on file  Tobacco Use  . Smoking status: Former Smoker    Packs/day: 1.00    Years: 13.00    Pack years: 13.00    Types: Cigarettes    Quit date: 08/12/2013    Years since quitting: 5.4  . Smokeless tobacco: Never Used  Substance and Sexual Activity  . Alcohol use: Yes    Alcohol/week: 5.0 standard drinks    Types: 5 Glasses of wine per week    Comment: ocassionally  . Drug use: No  . Sexual activity: Yes    Partners: Male    Birth control/protection: None    Comment: approx  7-[redacted] wks gestation?? per patient  Lifestyle  . Physical activity    Days per week: Not on file    Minutes per session: Not on file  . Stress: Not on file  Relationships  . Social Herbalist on phone: Not on file    Gets together: Not on file    Attends religious service: Not on file    Active member of club or organization: Not on file    Attends meetings of clubs or organizations: Not on file    Relationship status: Not on file  . Intimate partner violence    Fear of current or ex partner: Not on file    Emotionally abused: Not on file    Physically abused: Not on file    Forced sexual activity: Not on file  Other Topics Concern  . Not on file  Social History Narrative  . Not on file    Review of Systems  Constitutional: Negative for chills.  HENT: Positive for postnasal drip and sore throat.   Respiratory: Negative.   Cardiovascular: Negative.   Neurological: Positive for weakness.   vital Signs: Temp 97.8 F (36.6 C)   Ht _0  (1.778 m)   Wt 220 lb (99.8 kg)   BMI 31.57 kg/m   Observation/Objective: She is in NAD, does look pale and tired   Assessment/Plan: 1. Other fatigue  - Update her labs, pt was instructed to call back if has fever or chills or any other symptoms  - B12 and Folate Panel - Ferritin; Future - CBC with Differential/Platelet; Future - Lipid Panel With LDL/HDL Ratio; Future - TSH; Future - T4, free; Future - Comprehensive metabolic panel - Sed Rate (ESR) - ANA Direct w/Reflex if Positive; Future  2. Primary insomnia - Continue Ambien  General Counseling: Ladeidra verbalizes understanding of the findings of today's phone visit and agrees with plan of treatment. I have discussed any further diagnostic evaluation that may be needed or ordered today. We also reviewed her medications today. she has been encouraged to call the office with any questions or concerns that should arise related to todays visit.  Orders Placed This  Encounter  Procedures  . B12 and Folate Panel  . Ferritin  . CBC with Differential/Platelet  . Lipid Panel With LDL/HDL Ratio  . TSH  . T4, free  . Comprehensive metabolic panel  . Sed Rate (ESR)  . ANA Direct w/Reflex if Positive  . CBC with Differential/Platelet  . Comprehensive metabolic panel  . B12 and Folate Panel  . T4, free  . TSH  . ANA w/Reflex if Positive  . Sedimentation rate  . Ferritin    Time spent: 40 Minutes Dr Lavera Guise Internal medicine

## 2019-01-13 ENCOUNTER — Telehealth: Payer: Self-pay | Admitting: Internal Medicine

## 2019-01-13 DIAGNOSIS — Z20828 Contact with and (suspected) exposure to other viral communicable diseases: Secondary | ICD-10-CM | POA: Diagnosis not present

## 2019-01-13 LAB — LIPID PANEL WITH LDL/HDL RATIO
Cholesterol, Total: 188 mg/dL (ref 100–199)
HDL: 54 mg/dL (ref >39)
LDL Chol Calc (NIH): 102 mg/dL — ABNORMAL HIGH (ref 0–99)
LDL/HDL Ratio: 1.9 ratio (ref 0.0–3.2)
Triglycerides: 187 mg/dL — ABNORMAL HIGH (ref 0–149)
VLDL Cholesterol Cal: 32 mg/dL (ref 5–40)

## 2019-01-13 LAB — COMPREHENSIVE METABOLIC PANEL
ALT: 13 IU/L (ref 0–32)
AST: 14 IU/L (ref 0–40)
Albumin/Globulin Ratio: 1.6 (ref 1.2–2.2)
Albumin: 3.9 g/dL (ref 3.8–4.8)
Alkaline Phosphatase: 57 IU/L (ref 39–117)
BUN/Creatinine Ratio: 14 (ref 9–23)
BUN: 10 mg/dL (ref 6–24)
Bilirubin Total: <0.2 mg/dL (ref 0.0–1.2)
CO2: 25 mmol/L (ref 20–29)
Calcium: 9.2 mg/dL (ref 8.7–10.2)
Chloride: 103 mmol/L (ref 96–106)
Creatinine, Ser: 0.69 mg/dL (ref 0.57–1.00)
GFR calc Af Amer: 124 mL/min/{1.73_m2} (ref >59)
GFR calc non Af Amer: 108 mL/min/{1.73_m2} (ref >59)
Globulin, Total: 2.5 g/dL (ref 1.5–4.5)
Glucose: 94 mg/dL (ref 65–99)
Potassium: 4.3 mmol/L (ref 3.5–5.2)
Sodium: 140 mmol/L (ref 134–144)
Total Protein: 6.4 g/dL (ref 6.0–8.5)

## 2019-01-13 LAB — CBC WITH DIFFERENTIAL/PLATELET
Basophils Absolute: 0.1 10*3/uL (ref 0.0–0.2)
Basos: 1 %
EOS (ABSOLUTE): 0.4 10*3/uL (ref 0.0–0.4)
Eos: 6 %
Hematocrit: 39.3 % (ref 34.0–46.6)
Hemoglobin: 13.4 g/dL (ref 11.1–15.9)
Immature Grans (Abs): 0 10*3/uL (ref 0.0–0.1)
Immature Granulocytes: 0 %
Lymphocytes Absolute: 1.9 10*3/uL (ref 0.7–3.1)
Lymphs: 26 %
MCH: 29.8 pg (ref 26.6–33.0)
MCHC: 34.1 g/dL (ref 31.5–35.7)
MCV: 88 fL (ref 79–97)
Monocytes Absolute: 0.4 10*3/uL (ref 0.1–0.9)
Monocytes: 6 %
Neutrophils Absolute: 4.6 10*3/uL (ref 1.4–7.0)
Neutrophils: 61 %
Platelets: 241 10*3/uL (ref 150–450)
RBC: 4.49 x10E6/uL (ref 3.77–5.28)
RDW: 11.6 % — ABNORMAL LOW (ref 11.7–15.4)
WBC: 7.5 10*3/uL (ref 3.4–10.8)

## 2019-01-13 LAB — ANA W/REFLEX IF POSITIVE: Anti Nuclear Antibody (ANA): NEGATIVE

## 2019-01-13 LAB — FERRITIN: Ferritin: 32 ng/mL (ref 15–150)

## 2019-01-13 LAB — T4, FREE: Free T4: 1.04 ng/dL (ref 0.82–1.77)

## 2019-01-13 LAB — B12 AND FOLATE PANEL
Folate: 11.4 ng/mL (ref >3.0)
Vitamin B-12: 351 pg/mL (ref 232–1245)

## 2019-01-13 LAB — TSH: TSH: 1.05 u[IU]/mL (ref 0.450–4.500)

## 2019-01-13 LAB — SEDIMENTATION RATE: Sed Rate: 18 mm/hr (ref 0–32)

## 2019-01-14 NOTE — Telephone Encounter (Signed)
Pt is feeling better and notified of labs

## 2019-01-14 NOTE — Telephone Encounter (Signed)
Pt called and notified of labs, pt is feeling better , pt has follow up with heather in January

## 2019-02-19 ENCOUNTER — Telehealth: Payer: Self-pay

## 2019-02-19 ENCOUNTER — Other Ambulatory Visit: Payer: Self-pay | Admitting: Nurse Practitioner

## 2019-02-19 DIAGNOSIS — R41840 Attention and concentration deficit: Secondary | ICD-10-CM

## 2019-02-19 MED ORDER — AMPHETAMINE-DEXTROAMPHETAMINE 5 MG PO TABS: ORAL_TABLET | ORAL | 0 refills | Status: DC

## 2019-02-19 NOTE — Progress Notes (Signed)
Renewed adderall 5mg  tablets twice daily and sent to [facility].

## 2019-02-19 NOTE — Telephone Encounter (Signed)
CONFIRMED AND SCREENED FOR 02-24-19 OV. °

## 2019-02-19 NOTE — Telephone Encounter (Signed)
Renewed adderall 5mg  tablets twice daily and sent to [facility].

## 2019-02-24 ENCOUNTER — Encounter: Payer: Self-pay | Admitting: Nurse Practitioner

## 2019-02-24 ENCOUNTER — Other Ambulatory Visit: Payer: Self-pay

## 2019-02-24 ENCOUNTER — Ambulatory Visit: Payer: BC Managed Care – PPO | Admitting: Nurse Practitioner

## 2019-02-24 VITALS — BP 100/65 | HR 71 | Temp 97.9°F | Resp 16 | Ht 70.0 in | Wt 218.0 lb

## 2019-02-24 DIAGNOSIS — F5101 Primary insomnia: Secondary | ICD-10-CM | POA: Diagnosis not present

## 2019-02-24 DIAGNOSIS — Z79899 Other long term (current) drug therapy: Secondary | ICD-10-CM

## 2019-02-24 DIAGNOSIS — R41840 Attention and concentration deficit: Secondary | ICD-10-CM

## 2019-02-24 DIAGNOSIS — R5383 Other fatigue: Secondary | ICD-10-CM | POA: Diagnosis not present

## 2019-02-24 LAB — POCT URINE DRUG SCREEN
POC Amphetamine UR: POSITIVE — AB
POC BENZODIAZEPINES UR: NOT DETECTED
POC Barbiturate UR: NOT DETECTED
POC Cocaine UR: NOT DETECTED
POC Ecstasy UR: NOT DETECTED
POC Marijuana UR: NOT DETECTED
POC Methadone UR: NOT DETECTED
POC Methamphetamine UR: NOT DETECTED
POC Opiate Ur: NOT DETECTED
POC Oxycodone UR: NOT DETECTED
POC PHENCYCLIDINE UR: NOT DETECTED
POC TRICYCLICS UR: NOT DETECTED

## 2019-02-24 MED ORDER — ZOLPIDEM TARTRATE 10 MG PO TABS: ORAL_TABLET | ORAL | 3 refills | Status: DC

## 2019-02-24 MED ORDER — AMPHETAMINE-DEXTROAMPHETAMINE 5 MG PO TABS: ORAL_TABLET | ORAL | 0 refills | Status: DC

## 2019-02-24 NOTE — Progress Notes (Signed)
[facility] [place], [place] [address]  Internal MEDICINE  Office Visit Note  Patient Name: Deanna Sims  [id number]  EB:[id number]  Date of Service: 02/24/2019  Chief Complaint  Patient presents with  . Medication Refill    adderall    The patient is here for routine follow up. She takes adderall 5mg  twice daily when needed. She has no concerns or complaints regarding this medication. She does need to have refills for this today. She currently takes 5mg  ambien at bedtime to help with sleep. She states that there are some days when she does not need this at all. There are other nights when she needs more than just the 5mg . Has done OK when taking 10mg  in the past.       Current Medication: Outpatient Encounter Medications as of 02/24/2019  Medication Sig  . amphetamine-dextroamphetamine (ADDERALL) 5 MG tablet Take one to 2 tabs as needed  . fluticasone (FLONASE) 50 MCG/ACT nasal spray Place 1 spray into both nostrils daily as needed for allergies or rhinitis.  Marland Kitchen ibuprofen (ADVIL) 600 MG tablet Take 1 tablet (600 mg total) by mouth every 6 (six) hours.  . SPRINTEC 28 0.25-35 MG-MCG tablet Take 1 tablet by mouth daily.  Marland Kitchen zolpidem (AMBIEN) 10 MG tablet Take one tab po qhs as needed, may take one extra if needed  . [DISCONTINUED] amphetamine-dextroamphetamine (ADDERALL) 5 MG tablet Take one to 2 tabs as needed  . [DISCONTINUED] amphetamine-dextroamphetamine (ADDERALL) 5 MG tablet Take one to 2 tabs as needed  . [DISCONTINUED] amphetamine-dextroamphetamine (ADDERALL) 5 MG tablet Take one to 2 tabs as needed  . [DISCONTINUED] zolpidem (AMBIEN) 5 MG tablet Take one tab po qhs as needed, may take one extra if needed   No facility-administered encounter medications on file as of 02/24/2019.    Surgical History: Past Surgical History:  Procedure Laterality Date  . CESAREAN SECTION N/A 08/11/2015   Procedure: Primary CESAREAN SECTION;  Surgeon: Brien Few, MD;   Location: [place];  Service: Obstetrics;  Laterality: N/A;  EDD: 08/17/15 Allergy: PCN  . CESAREAN SECTION N/A 08/14/2017   Procedure: Repeat CESAREAN SECTION;  Surgeon: Brien Few, MD;  Location: [place];  Service: Obstetrics;  Laterality: N/A;  EDD: 08/20/17 Allergy: Penicillin  . DILATION AND EVACUATION N/A 03/15/2014   Procedure: DILATATION AND EVACUATION;  Surgeon: Lyman Speller, MD;  Location: [facility];  Service: Gynecology;  Laterality: N/A;  . LAPAROSCOPY N/A 03/15/2014   Procedure: LAPAROSCOPY OPERATIVE;  Surgeon: Lyman Speller, MD;  Location: [facility];  Service: Gynecology;  Laterality: N/A;  . WISDOM TOOTH EXTRACTION      Medical History: Past Medical History:  Diagnosis Date  . Bell palsy   . Insomnia   . Postpartum care following repeat cesarean delivery (6/29) 08/12/2015  . Seasonal allergies   . Sinusitis     Family History: Family History  Problem Relation Age of Onset  . Uterine cancer Mother 57  . Cancer - Ovarian Maternal Grandmother   . Cancer - Lung Maternal Grandmother   . Cancer - Lung Maternal Grandfather     Social History   Socioeconomic History  . Marital status: Married    Spouse name: Not on file  . Number of children: Not on file  . Years of education: Not on file  . Highest education level: Not on file  Occupational History  . Not on file  Tobacco Use  . Smoking status: Former Smoker  Packs/day: 1.00    Years: 13.00    Pack years: 13.00    Types: Cigarettes    Quit date: 08/12/2013    Years since quitting: 5.5  . Smokeless tobacco: Never Used  Substance and Sexual Activity  . Alcohol use: Yes    Alcohol/week: 5.0 standard drinks    Types: 5 Glasses of wine per week    Comment: ocassionally  . Drug use: No  . Sexual activity: Yes    Partners: Male    Birth control/protection: None    Comment: approx 7-[redacted] wks gestation?? per patient  Other Topics Concern  . Not on file  Social History Narrative  . Not  on file   Social Determinants of Health   Financial Resource Strain:   . Difficulty of Paying Living Expenses: Not on file  Food Insecurity:   . Worried About Charity fundraiser in the Last Year: Not on file  . Ran Out of Food in the Last Year: Not on file  Transportation Needs:   . Lack of Transportation (Medical): Not on file  . Lack of Transportation (Non-Medical): Not on file  Physical Activity:   . Days of Exercise per Week: Not on file  . Minutes of Exercise per Session: Not on file  Stress:   . Feeling of Stress : Not on file  Social Connections:   . Frequency of Communication with Friends and Family: Not on file  . Frequency of Social Gatherings with Friends and Family: Not on file  . Attends Religious Services: Not on file  . Active Member of Clubs or Organizations: Not on file  . Attends Archivist Meetings: Not on file  . Marital Status: Not on file  Intimate Partner Violence:   . Fear of Current or Ex-Partner: Not on file  . Emotionally Abused: Not on file  . Physically Abused: Not on file  . Sexually Abused: Not on file      Review of Systems  Constitutional: Negative for chills, fatigue and unexpected weight change.  HENT: Negative for congestion, postnasal drip, rhinorrhea, sneezing and sore throat.   Respiratory: Negative for cough, chest tightness and shortness of breath.   Cardiovascular: Negative for chest pain and palpitations.  Gastrointestinal: Negative for abdominal pain, constipation, diarrhea, nausea and vomiting.  Endocrine: Negative for cold intolerance, heat intolerance, polydipsia and polyuria.  Musculoskeletal: Negative for arthralgias, back pain, joint swelling and neck pain.  Skin: Negative for rash.  Allergic/Immunologic: Negative for environmental allergies.  Neurological: Negative for dizziness, tremors, numbness and headaches.  Hematological: Negative for adenopathy. Does not bruise/bleed easily.  Psychiatric/Behavioral:  Positive for decreased concentration and sleep disturbance. Negative for behavioral problems (Depression) and suicidal ideas. The patient is not nervous/anxious.     Vital Signs: BP 100/65   Pulse 71   Temp 97.9 F (36.6 C)   Resp 16   Ht 5\' 10"  (1.778 m)   Wt 218 lb (98.9 kg)   SpO2 97%   BMI 31.28 kg/m    Physical Exam Vitals and nursing note reviewed.  Constitutional:      General: She is not in acute distress.    Appearance: Normal appearance. She is well-developed. She is not diaphoretic.  HENT:     Head: Normocephalic and atraumatic.     Mouth/Throat:     Pharynx: No oropharyngeal exudate.  Eyes:     Pupils: Pupils are equal, round, and reactive to light.  Neck:     Thyroid: No thyromegaly.  Vascular: No JVD.     Trachea: No tracheal deviation.  Cardiovascular:     Rate and Rhythm: Normal rate and regular rhythm.     Heart sounds: Normal heart sounds. No murmur. No friction rub. No gallop.   Pulmonary:     Effort: Pulmonary effort is normal. No respiratory distress.     Breath sounds: Normal breath sounds. No wheezing or rales.  Chest:     Chest wall: No tenderness.  Abdominal:     Palpations: Abdomen is soft.  Musculoskeletal:        General: Normal range of motion.     Cervical back: Normal range of motion and neck supple.  Lymphadenopathy:     Cervical: No cervical adenopathy.  Skin:    General: Skin is warm and dry.  Neurological:     Mental Status: She is alert and oriented to person, place, and time.     Cranial Nerves: No cranial nerve deficit.  Psychiatric:        Mood and Affect: Mood normal.        Behavior: Behavior normal.        Thought Content: Thought content normal.        Judgment: Judgment normal.    Assessment/Plan:  1. 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 Will monitor   2. Attention and concentration deficit May continue adderall 5mg  twice daily as needed. Three 30 day  prescriptions were sent to pharmacy. Dates are 03/20/2019, 04/17/2019, and 05/16/2019 - amphetamine-dextroamphetamine (ADDERALL) 5 MG tablet; Take one to 2 tabs as needed  Dispense: 60 tablet; Refill: 0  3. Primary insomnia Increase ambien to 10mg  at bedtime. Advised she taking only as needed. New prescription sent to her pharmacy today. - zolpidem (AMBIEN) 10 MG tablet; Take one tab po qhs as needed, may take one extra if needed  Dispense: 30 tablet; Refill: 3  4. Encounter for long-term (current) use of high-risk medication - POCT Urine Drug Screen appropriately positive for AMP only.   General Counseling: Karly verbalizes understanding of the findings of todays visit and agrees with plan of treatment. I have discussed any further diagnostic evaluation that may be needed or ordered today. We also reviewed her medications today. she has been encouraged to call the office with any questions or concerns that should arise related to todays visit.  Refilled Controlled medications today. Reviewed risks and possible side effects associated with taking Stimulants. Combination of these drugs with other psychotropic medications could cause dizziness and drowsiness. Pt needs to Monitor symptoms and exercise caution in driving and operating heavy machinery to avoid damages to oneself, to others and to the surroundings. Patient verbalized understanding in this matter. Dependence and abuse for these drugs will be monitored closely. A Controlled substance policy and procedure is on file which allows [facility] to order a urine drug screen test at any visit. Patient understands and agrees with the plan..  This patient was seen by Leretha Pol FNP Collaboration with Dr Lavera Guise as a part of collaborative care agreement  Orders Placed This Encounter  Procedures  . POCT Urine Drug Screen    Meds ordered this encounter  Medications  . DISCONTD: amphetamine-dextroamphetamine (ADDERALL) 5 MG tablet     Sig: Take one to 2 tabs as needed    Dispense:  60 tablet    Refill:  0    Fill after 03/20/2019    Order Specific Question:  Supervising Provider    Answer:   Lavera Guise [id number]  . zolpidem (AMBIEN) 10 MG tablet    Sig: Take one tab po qhs as needed, may take one extra if needed    Dispense:  30 tablet    Refill:  3    Order Specific Question:   Supervising Provider    Answer:   Lavera Guise [place]  . DISCONTD: amphetamine-dextroamphetamine (ADDERALL) 5 MG tablet    Sig: Take one to 2 tabs as needed    Dispense:  60 tablet    Refill:  0    Fill after 04/17/2019    Order Specific Question:   Supervising Provider    Answer:   Lavera Guise [place]  . amphetamine-dextroamphetamine (ADDERALL) 5 MG tablet    Sig: Take one to 2 tabs as needed    Dispense:  60 tablet    Refill:  0    Fill after 05/15/2019    Order Specific Question:   Supervising Provider    Answer:   Lavera Guise [id number]    Total time spent: 30 Minutes  Time spent includes review of chart, medications, test results, and follow up plan with the patient.      Dr Lavera Guise Internal medicine

## 2019-05-12 ENCOUNTER — Other Ambulatory Visit: Payer: Self-pay | Admitting: Obstetrics and Gynecology

## 2019-05-12 DIAGNOSIS — R928 Other abnormal and inconclusive findings on diagnostic imaging of breast: Secondary | ICD-10-CM

## 2019-05-29 ENCOUNTER — Ambulatory Visit
Admission: RE | Admit: 2019-05-29 | Discharge: 2019-05-29 | Disposition: A | Discharge status: Home / Self Care | Payer: No Typology Code available for payment source | Source: Ambulatory Visit | Attending: Obstetrics and Gynecology | Admitting: Obstetrics and Gynecology

## 2019-05-29 ENCOUNTER — Ambulatory Visit
Admission: RE | Admit: 2019-05-29 | Discharge: 2019-05-29 | Disposition: A | Discharge status: Home / Self Care | Payer: BLUE CROSS/BLUE SHIELD | Source: Ambulatory Visit | Attending: Obstetrics and Gynecology | Admitting: Obstetrics and Gynecology

## 2019-05-29 ENCOUNTER — Other Ambulatory Visit: Payer: Self-pay

## 2019-05-29 DIAGNOSIS — R928 Other abnormal and inconclusive findings on diagnostic imaging of breast: Secondary | ICD-10-CM

## 2019-06-03 ENCOUNTER — Telehealth: Payer: Self-pay

## 2019-06-03 NOTE — Telephone Encounter (Signed)
Confirmed and screened for 06-05-19 ov. °

## 2019-06-05 ENCOUNTER — Ambulatory Visit: Payer: Managed Care, Other (non HMO) | Admitting: Nurse Practitioner

## 2019-06-05 ENCOUNTER — Encounter: Payer: Self-pay | Admitting: Nurse Practitioner

## 2019-06-05 ENCOUNTER — Other Ambulatory Visit: Payer: Self-pay

## 2019-06-05 VITALS — BP 105/73 | HR 71 | Temp 97.3°F | Resp 16 | Ht 70.0 in | Wt 204.6 lb

## 2019-06-05 DIAGNOSIS — R5383 Other fatigue: Secondary | ICD-10-CM

## 2019-06-05 DIAGNOSIS — R41840 Attention and concentration deficit: Secondary | ICD-10-CM | POA: Diagnosis not present

## 2019-06-05 DIAGNOSIS — F5101 Primary insomnia: Secondary | ICD-10-CM | POA: Diagnosis not present

## 2019-06-05 MED ORDER — ZOLPIDEM TARTRATE 10 MG PO TABS: ORAL_TABLET | ORAL | 3 refills | Status: DC

## 2019-06-05 MED ORDER — AMPHETAMINE-DEXTROAMPHETAMINE 5 MG PO TABS: ORAL_TABLET | ORAL | 0 refills | Status: DC

## 2019-06-05 NOTE — Progress Notes (Signed)
[facility] [place], [place] [address]  Internal MEDICINE  Office Visit Note  Patient Name: Deanna Sims  [id number]  [id number]  Date of Service: 06/22/2019  Chief Complaint  Patient presents with  . ADD    The patient is here for routine follow up visit. She takes adderall 5mg  twice daily when needed. She has no concerns or complaints regarding this medication. She does need to have refills for this today. She currently takes 10mg  ambien at night, some nights. There are still many nights she is able to cut this in half, and still other nights when she does not need this at all. She does need to have refills for this today.       Current Medication: Outpatient Encounter Medications as of 06/05/2019  Medication Sig  . amphetamine-dextroamphetamine (ADDERALL) 5 MG tablet Take one to 2 tabs as needed  . fluticasone (FLONASE) 50 MCG/ACT nasal spray Place 1 spray into both nostrils daily as needed for allergies or rhinitis.  Marland Kitchen ibuprofen (ADVIL) 600 MG tablet Take 1 tablet (600 mg total) by mouth every 6 (six) hours.  . SPRINTEC 28 0.25-35 MG-MCG tablet Take 1 tablet by mouth daily.  Marland Kitchen zolpidem (AMBIEN) 10 MG tablet Take one tab po qhs as needed, may take one extra if needed  . [DISCONTINUED] amphetamine-dextroamphetamine (ADDERALL) 5 MG tablet Take one to 2 tabs as needed  . [DISCONTINUED] amphetamine-dextroamphetamine (ADDERALL) 5 MG tablet Take one to 2 tabs as needed  . [DISCONTINUED] amphetamine-dextroamphetamine (ADDERALL) 5 MG tablet Take one to 2 tabs as needed  . [DISCONTINUED] zolpidem (AMBIEN) 10 MG tablet Take one tab po qhs as needed, may take one extra if needed   No facility-administered encounter medications on file as of 06/05/2019.    Surgical History: Past Surgical History:  Procedure Laterality Date  . CESAREAN SECTION N/A 08/11/2015   Procedure: Primary CESAREAN SECTION;  Surgeon: Brien Few, MD;  Location: [place];   Service: Obstetrics;  Laterality: N/A;  EDD: 08/17/15 Allergy: PCN  . CESAREAN SECTION N/A 08/14/2017   Procedure: Repeat CESAREAN SECTION;  Surgeon: Brien Few, MD;  Location: [place];  Service: Obstetrics;  Laterality: N/A;  EDD: 08/20/17 Allergy: Penicillin  . DILATION AND EVACUATION N/A 03/15/2014   Procedure: DILATATION AND EVACUATION;  Surgeon: Lyman Speller, MD;  Location: [facility];  Service: Gynecology;  Laterality: N/A;  . LAPAROSCOPY N/A 03/15/2014   Procedure: LAPAROSCOPY OPERATIVE;  Surgeon: Lyman Speller, MD;  Location: [facility];  Service: Gynecology;  Laterality: N/A;  . WISDOM TOOTH EXTRACTION      Medical History: Past Medical History:  Diagnosis Date  . Bell palsy   . Insomnia   . Postpartum care following repeat cesarean delivery (6/29) 08/12/2015  . Seasonal allergies   . Sinusitis     Family History: Family History  Problem Relation Age of Onset  . Uterine cancer Mother 34  . Cancer - Ovarian Maternal Grandmother   . Cancer - Lung Maternal Grandmother   . Cancer - Lung Maternal Grandfather     Social History   Socioeconomic History  . Marital status: Married    Spouse name: Not on file  . Number of children: Not on file  . Years of education: Not on file  . Highest education level: Not on file  Occupational History  . Not on file  Tobacco Use  . Smoking status: Former Smoker    Packs/day: 1.00    Years: 13.00  Pack years: 13.00    Types: Cigarettes    Quit date: 08/12/2013    Years since quitting: 5.8  . Smokeless tobacco: Never Used  Substance and Sexual Activity  . Alcohol use: Yes    Alcohol/week: 5.0 standard drinks    Types: 5 Glasses of wine per week    Comment: ocassionally  . Drug use: No  . Sexual activity: Yes    Partners: Male    Birth control/protection: None    Comment: approx 7-[redacted] wks gestation?? per patient  Other Topics Concern  . Not on file  Social History Narrative  . Not on file   Social Determinants  of Health   Financial Resource Strain:   . Difficulty of Paying Living Expenses:   Food Insecurity:   . Worried About Charity fundraiser in the Last Year:   . Arboriculturist in the Last Year:   Transportation Needs:   . Film/video editor (Medical):   Marland Kitchen Lack of Transportation (Non-Medical):   Physical Activity:   . Days of Exercise per Week:   . Minutes of Exercise per Session:   Stress:   . Feeling of Stress :   Social Connections:   . Frequency of Communication with Friends and Family:   . Frequency of Social Gatherings with Friends and Family:   . Attends Religious Services:   . Active Member of Clubs or Organizations:   . Attends Archivist Meetings:   Marland Kitchen Marital Status:   Intimate Partner Violence:   . Fear of Current or Ex-Partner:   . Emotionally Abused:   Marland Kitchen Physically Abused:   . Sexually Abused:       Review of Systems  Constitutional: Negative for activity change, chills, fatigue and unexpected weight change.  HENT: Negative for congestion, postnasal drip, rhinorrhea, sneezing and sore throat.   Respiratory: Negative for cough, chest tightness and shortness of breath.   Cardiovascular: Negative for chest pain and palpitations.  Gastrointestinal: Negative for abdominal pain, constipation, diarrhea, nausea and vomiting.  Endocrine: Negative for cold intolerance, heat intolerance, polydipsia and polyuria.  Musculoskeletal: Negative for arthralgias, back pain, joint swelling and neck pain.  Skin: Negative for rash.  Allergic/Immunologic: Negative for environmental allergies.  Neurological: Negative for dizziness, tremors, numbness and headaches.  Hematological: Negative for adenopathy. Does not bruise/bleed easily.  Psychiatric/Behavioral: Positive for decreased concentration and sleep disturbance. Negative for behavioral problems (Depression) and suicidal ideas. The patient is not nervous/anxious.     Today's Vitals   06/05/19 1138  BP: 105/73   Pulse: 71  Resp: 16  Temp: (!) 97.3 F (36.3 C)  SpO2: 98%  Weight: 204 lb 9.6 oz (92.8 kg)  Height: 5\' 10"  (1.778 m)   Body mass index is 29.36 kg/m.  Physical Exam Vitals and nursing note reviewed.  Constitutional:      General: She is not in acute distress.    Appearance: Normal appearance. She is well-developed. She is not diaphoretic.  HENT:     Head: Normocephalic and atraumatic.     Mouth/Throat:     Pharynx: No oropharyngeal exudate.  Eyes:     Pupils: Pupils are equal, round, and reactive to light.  Neck:     Thyroid: No thyromegaly.     Vascular: No JVD.     Trachea: No tracheal deviation.  Cardiovascular:     Rate and Rhythm: Normal rate and regular rhythm.     Heart sounds: Normal heart sounds. No murmur. No friction  rub. No gallop.   Pulmonary:     Effort: Pulmonary effort is normal. No respiratory distress.     Breath sounds: Normal breath sounds. No wheezing or rales.  Chest:     Chest wall: No tenderness.  Abdominal:     Palpations: Abdomen is soft.  Musculoskeletal:        General: Normal range of motion.     Cervical back: Normal range of motion and neck supple.  Lymphadenopathy:     Cervical: No cervical adenopathy.  Skin:    General: Skin is warm and dry.  Neurological:     Mental Status: She is alert and oriented to person, place, and time.     Cranial Nerves: No cranial nerve deficit.  Psychiatric:        Mood and Affect: Mood normal.        Behavior: Behavior normal.        Thought Content: Thought content normal.        Judgment: Judgment normal.   Assessment/Plan: 1. Other fatigue Likely from working full time and caring for two young children at home. Will continue to monitor.   2. Attention and concentration deficit May continue adderall 5mg  up to twice daily as needed. Three 30 day prescriptions were sent to her pharmacy. Dates are 06/05/2019, 07/03/2019, and 08/01/2019 - amphetamine-dextroamphetamine (ADDERALL) 5 MG tablet; Take  one to 2 tabs as needed  Dispense: 60 tablet; Refill: 0  3. Primary insomnia May continue to take ambien 10mg  at bedtime as needed for acute insomnia.  - zolpidem (AMBIEN) 10 MG tablet; Take one tab po qhs as needed, may take one extra if needed  Dispense: 30 tablet; Refill: 3  General Counseling: Deanna Sims verbalizes understanding of the findings of todays visit and agrees with plan of treatment. I have discussed any further diagnostic evaluation that may be needed or ordered today. We also reviewed her medications today. she has been encouraged to call the office with any questions or concerns that should arise related to todays visit.   Refilled Controlled medications today. Reviewed risks and possible side effects associated with taking Stimulants. Combination of these drugs with other psychotropic medications could cause dizziness and drowsiness. Pt needs to Monitor symptoms and exercise caution in driving and operating heavy machinery to avoid damages to oneself, to others and to the surroundings. Patient verbalized understanding in this matter. Dependence and abuse for these drugs will be monitored closely. A Controlled substance policy and procedure is on file which allows [facility] to order a urine drug screen test at any visit. Patient understands and agrees with the plan.  This patient was seen by [place] with Dr Lavera Guise as a part of collaborative care agreement  Meds ordered this encounter  Medications  . DISCONTD: amphetamine-dextroamphetamine (ADDERALL) 5 MG tablet    Sig: Take one to 2 tabs as needed    Dispense:  60 tablet    Refill:  0    Order Specific Question:   Supervising Provider    Answer:   Lavera Guise [place]  . zolpidem (AMBIEN) 10 MG tablet    Sig: Take one tab po qhs as needed, may take one extra if needed    Dispense:  30 tablet    Refill:  3    Order Specific Question:   Supervising Provider    Answer:   Lavera Guise  [place]  . DISCONTD: amphetamine-dextroamphetamine (ADDERALL) 5 MG tablet  Sig: Take one to 2 tabs as needed    Dispense:  60 tablet    Refill:  0    Fill after 07/03/2019    Order Specific Question:   Supervising Provider    Answer:   Lavera Guise [place]  . amphetamine-dextroamphetamine (ADDERALL) 5 MG tablet    Sig: Take one to 2 tabs as needed    Dispense:  60 tablet    Refill:  0    Fill after 07/03/2019    Order Specific Question:   Supervising Provider    Answer:   Lavera Guise [id number]    Total time spent: 20 Minutes   Time spent includes review of chart, medications, test results, and follow up plan with the patient.      Dr Lavera Guise Internal medicine

## 2019-06-19 ENCOUNTER — Ambulatory Visit: Payer: Managed Care, Other (non HMO) | Admitting: Adult Health

## 2019-06-19 ENCOUNTER — Telehealth: Payer: Self-pay

## 2019-06-19 ENCOUNTER — Encounter: Payer: Self-pay | Admitting: Adult Health

## 2019-06-19 ENCOUNTER — Other Ambulatory Visit: Payer: Self-pay

## 2019-06-19 VITALS — BP 105/66 | HR 73 | Temp 97.6°F | Resp 16 | Ht 70.0 in | Wt 202.0 lb

## 2019-06-19 DIAGNOSIS — G510 Bell's palsy: Secondary | ICD-10-CM

## 2019-06-19 NOTE — Telephone Encounter (Signed)
Patiet did not want to schedule follow up appointment at check out will call back to schedule. klh

## 2019-06-19 NOTE — Progress Notes (Signed)
[facility] [place], [place] [address]  Internal MEDICINE  Office Visit Note  Patient Name: Deanna Sims  [id number]  [id number]  Date of Service: 06/19/2019  Chief Complaint  Patient presents with  . Acute Visit    headaches, left ringing in ear, blury vision in left eye     HPI Pt is here for a sick visit.  She reports about 2 years ago she had some post-partum bells palsy.  She reports over the last 3 weeks it has been intermittently coming back on.  She is complaining that she has blurred vision, ringing in her left ear, and headaches.  She reports yesterday when she made the appt, she had all those symptoms.  Today the ringing in the ear is gone, and the vision is better.  She continues to have the headache and the heaviness of her eye lid.       Current Medication:  Outpatient Encounter Medications as of 06/19/2019  Medication Sig  . amphetamine-dextroamphetamine (ADDERALL) 5 MG tablet Take one to 2 tabs as needed  . fluticasone (FLONASE) 50 MCG/ACT nasal spray Place 1 spray into both nostrils daily as needed for allergies or rhinitis.  Marland Kitchen ibuprofen (ADVIL) 600 MG tablet Take 1 tablet (600 mg total) by mouth every 6 (six) hours.  . SPRINTEC 28 0.25-35 MG-MCG tablet Take 1 tablet by mouth daily.  Marland Kitchen zolpidem (AMBIEN) 10 MG tablet Take one tab po qhs as needed, may take one extra if needed   No facility-administered encounter medications on file as of 06/19/2019.      Medical History: Past Medical History:  Diagnosis Date  . Bell palsy   . Insomnia   . Postpartum care following repeat cesarean delivery (6/29) 08/12/2015  . Seasonal allergies   . Sinusitis      Vital Signs: BP 105/66   Pulse 73   Temp 97.6 F (36.4 C)   Resp 16   Ht 5\' 10"  (1.778 m)   Wt 202 lb (91.6 kg)   SpO2 99%   BMI 28.98 kg/m    Review of Systems  Constitutional: Negative for chills, fatigue and unexpected weight change.  HENT: Negative for congestion,  rhinorrhea, sneezing and sore throat.   Eyes: Negative for photophobia, pain and redness.  Respiratory: Negative for cough, chest tightness and shortness of breath.   Cardiovascular: Negative for chest pain and palpitations.  Gastrointestinal: Negative for abdominal pain, constipation, diarrhea, nausea and vomiting.  Endocrine: Negative.   Genitourinary: Negative for dysuria and frequency.  Musculoskeletal: Negative for arthralgias, back pain, joint swelling and neck pain.  Skin: Negative for rash.  Allergic/Immunologic: Negative.   Neurological: Negative for tremors and numbness.  Hematological: Negative for adenopathy. Does not bruise/bleed easily.  Psychiatric/Behavioral: Negative for behavioral problems and sleep disturbance. The patient is not nervous/anxious.     Physical Exam Vitals and nursing note reviewed.  Constitutional:      General: She is not in acute distress.    Appearance: She is well-developed. She is not diaphoretic.  HENT:     Head: Normocephalic and atraumatic.     Mouth/Throat:     Pharynx: No oropharyngeal exudate.  Eyes:     Pupils: Pupils are equal, round, and reactive to light.  Neck:     Thyroid: No thyromegaly.     Vascular: No JVD.     Trachea: No tracheal deviation.  Cardiovascular:     Rate and Rhythm: Normal rate and regular rhythm.  Heart sounds: Normal heart sounds. No murmur. No friction rub. No gallop.   Pulmonary:     Effort: Pulmonary effort is normal. No respiratory distress.     Breath sounds: Normal breath sounds. No wheezing or rales.  Chest:     Chest wall: No tenderness.  Abdominal:     Palpations: Abdomen is soft.     Tenderness: There is no abdominal tenderness. There is no guarding.  Musculoskeletal:        General: Normal range of motion.     Cervical back: Normal range of motion and neck supple.  Lymphadenopathy:     Cervical: No cervical adenopathy.  Skin:    General: Skin is warm and dry.  Neurological:     Mental  Status: She is alert and oriented to person, place, and time.     Cranial Nerves: No cranial nerve deficit.  Psychiatric:        Behavior: Behavior normal.        Thought Content: Thought content normal.        Judgment: Judgment normal.    Assessment/Plan: 1. Bell palsy Discussed that stress could certainly be bringing these symptoms back on. Will provide Prednisone RX and discussed possiblity of MRI if symptoms continue.  She verbalized understanding.   General Counseling: Citlalic verbalizes understanding of the findings of todays visit and agrees with plan of treatment. I have discussed any further diagnostic evaluation that may be needed or ordered today. We also reviewed her medications today. she has been encouraged to call the office with any questions or concerns that should arise related to todays visit.   No orders of the defined types were placed in this encounter.   No orders of the defined types were placed in this encounter.   Time spent: 30 Minutes  This patient was seen by Orson Gear AGNP-C in Collaboration with Dr Lavera Guise as a part of collaborative care agreement.  Kendell Bane AGNP-C Internal Medicine

## 2019-06-23 ENCOUNTER — Ambulatory Visit: Payer: BC Managed Care – PPO | Admitting: Nurse Practitioner

## 2019-08-27 ENCOUNTER — Telehealth: Payer: Self-pay

## 2019-08-27 NOTE — Telephone Encounter (Signed)
Lmom to confirm and screen for 09-01-19 ov. 

## 2019-09-01 ENCOUNTER — Encounter: Payer: Self-pay | Admitting: Nurse Practitioner

## 2019-09-01 ENCOUNTER — Ambulatory Visit (INDEPENDENT_AMBULATORY_CARE_PROVIDER_SITE_OTHER): Payer: Managed Care, Other (non HMO) | Admitting: Nurse Practitioner

## 2019-09-01 ENCOUNTER — Other Ambulatory Visit: Payer: Self-pay

## 2019-09-01 VITALS — BP 122/60 | HR 71 | Temp 97.4°F | Resp 16 | Ht 70.0 in | Wt 198.6 lb

## 2019-09-01 DIAGNOSIS — J0140 Acute pansinusitis, unspecified: Secondary | ICD-10-CM

## 2019-09-01 DIAGNOSIS — R41840 Attention and concentration deficit: Secondary | ICD-10-CM | POA: Diagnosis not present

## 2019-09-01 DIAGNOSIS — Z79899 Other long term (current) drug therapy: Secondary | ICD-10-CM

## 2019-09-01 DIAGNOSIS — F5101 Primary insomnia: Secondary | ICD-10-CM

## 2019-09-01 LAB — POCT URINE DRUG SCREEN
Methylenedioxyamphetamine: NOT DETECTED
POC Amphetamine UR: NOT DETECTED
POC BENZODIAZEPINES UR: NOT DETECTED
POC Barbiturate UR: NOT DETECTED
POC Cocaine UR: NOT DETECTED
POC DRUG SCREEN OXIDANTS URINE: NORMAL
POC Ecstasy UR: NOT DETECTED
POC Marijuana UR: NOT DETECTED
POC Methadone UR: NOT DETECTED
POC Methamphetamine UR: NOT DETECTED
POC Opiate Ur: NOT DETECTED
POC Oxycodone UR: NOT DETECTED
POC PH URINE: NORMAL
POC PHENCYCLIDINE UR: NOT DETECTED
POC SPECIFIC GRAVITY URINE: NORMAL
POC TRICYCLICS UR: NOT DETECTED

## 2019-09-01 MED ORDER — ZOLPIDEM TARTRATE 10 MG PO TABS: ORAL_TABLET | ORAL | 3 refills | Status: DC

## 2019-09-01 MED ORDER — AMPHETAMINE-DEXTROAMPHETAMINE 5 MG PO TABS: ORAL_TABLET | ORAL | 0 refills | Status: DC

## 2019-09-01 MED ORDER — AZITHROMYCIN 250 MG PO TABS: ORAL_TABLET | ORAL | 0 refills | Status: DC

## 2019-09-01 NOTE — Progress Notes (Signed)
[facility] [place], [place] [address]  Internal MEDICINE  Office Visit Note  Patient Name: Deanna Sims  [id number]  [id number]  Date of Service: 09/27/2019  Chief Complaint  Patient presents with  . Follow-up    3 month adderall refills  . Sinusitis    congestion, HA, sinus pressure, ear pain, eye pain, cough x 2 weeks    The patient presents for routine follow up.  She takes adderall 5mg  twice daily when needed. She has no concerns or complaints regarding this medication. She does need to have refills for this today.  She currently takes 10mg  ambien at night, some nights. There are still many nights she is able to cut this in half, and still other nights when she does not need this at all. She does need to have refills for this today.       Current Medication: Outpatient Encounter Medications as of 09/01/2019  Medication Sig  . amphetamine-dextroamphetamine (ADDERALL) 5 MG tablet Take one to 2 tabs as needed  . fluticasone (FLONASE) 50 MCG/ACT nasal spray Place 1 spray into both nostrils daily as needed for allergies or rhinitis.  Marland Kitchen ibuprofen (ADVIL) 600 MG tablet Take 1 tablet (600 mg total) by mouth every 6 (six) hours.  . SPRINTEC 28 0.25-35 MG-MCG tablet Take 1 tablet by mouth daily.  Marland Kitchen zolpidem (AMBIEN) 10 MG tablet Take one tab po qhs as needed, may take one extra if needed  . [DISCONTINUED] amphetamine-dextroamphetamine (ADDERALL) 5 MG tablet Take one to 2 tabs as needed  . [DISCONTINUED] amphetamine-dextroamphetamine (ADDERALL) 5 MG tablet Take one to 2 tabs as needed  . [DISCONTINUED] amphetamine-dextroamphetamine (ADDERALL) 5 MG tablet Take one to 2 tabs as needed  . [DISCONTINUED] zolpidem (AMBIEN) 10 MG tablet Take one tab po qhs as needed, may take one extra if needed  . azithromycin (ZITHROMAX) 250 MG tablet z-pack - take as directed for 5 days   No facility-administered encounter medications on file as of 09/01/2019.    Surgical  History: Past Surgical History:  Procedure Laterality Date  . CESAREAN SECTION N/A 08/11/2015   Procedure: Primary CESAREAN SECTION;  Surgeon: Brien Few, MD;  Location: [place];  Service: Obstetrics;  Laterality: N/A;  EDD: 08/17/15 Allergy: PCN  . CESAREAN SECTION N/A 08/14/2017   Procedure: Repeat CESAREAN SECTION;  Surgeon: Brien Few, MD;  Location: [place];  Service: Obstetrics;  Laterality: N/A;  EDD: 08/20/17 Allergy: Penicillin  . DILATION AND EVACUATION N/A 03/15/2014   Procedure: DILATATION AND EVACUATION;  Surgeon: Lyman Speller, MD;  Location: [facility];  Service: Gynecology;  Laterality: N/A;  . LAPAROSCOPY N/A 03/15/2014   Procedure: LAPAROSCOPY OPERATIVE;  Surgeon: Lyman Speller, MD;  Location: [facility];  Service: Gynecology;  Laterality: N/A;  . WISDOM TOOTH EXTRACTION      Medical History: Past Medical History:  Diagnosis Date  . Bell palsy   . Insomnia   . Postpartum care following repeat cesarean delivery (6/29) 08/12/2015  . Seasonal allergies   . Sinusitis     Family History: Family History  Problem Relation Age of Onset  . Uterine cancer Mother 5  . Cancer - Ovarian Maternal Grandmother   . Cancer - Lung Maternal Grandmother   . Cancer - Lung Maternal Grandfather     Social History   Socioeconomic History  . Marital status: Married    Spouse name: Not on file  . Number of children: Not on file  . Years of  education: Not on file  . Highest education level: Not on file  Occupational History  . Not on file  Tobacco Use  . Smoking status: Former Smoker    Packs/day: 1.00    Years: 13.00    Pack years: 13.00    Types: Cigarettes    Quit date: 08/12/2013    Years since quitting: 6.1  . Smokeless tobacco: Never Used  Vaping Use  . Vaping Use: Never used  Substance and Sexual Activity  . Alcohol use: Yes    Alcohol/week: 5.0 standard drinks    Types: 5 Glasses of wine per week    Comment: ocassionally  . Drug use: No   . Sexual activity: Yes    Partners: Male    Birth control/protection: None    Comment: approx 7-[redacted] wks gestation?? per patient  Other Topics Concern  . Not on file  Social History Narrative  . Not on file   Social Determinants of Health   Financial Resource Strain:   . Difficulty of Paying Living Expenses:   Food Insecurity:   . Worried About Charity fundraiser in the Last Year:   . Arboriculturist in the Last Year:   Transportation Needs:   . Film/video editor (Medical):   Marland Kitchen Lack of Transportation (Non-Medical):   Physical Activity:   . Days of Exercise per Week:   . Minutes of Exercise per Session:   Stress:   . Feeling of Stress :   Social Connections:   . Frequency of Communication with Friends and Family:   . Frequency of Social Gatherings with Friends and Family:   . Attends Religious Services:   . Active Member of Clubs or Organizations:   . Attends Archivist Meetings:   Marland Kitchen Marital Status:   Intimate Partner Violence:   . Fear of Current or Ex-Partner:   . Emotionally Abused:   Marland Kitchen Physically Abused:   . Sexually Abused:       Review of Systems  Constitutional: Positive for fatigue. Negative for activity change, chills and unexpected weight change.  HENT: Positive for congestion, postnasal drip, rhinorrhea, sinus pressure and sinus pain. Negative for sneezing and sore throat.   Respiratory: Negative for cough, chest tightness, shortness of breath and wheezing.   Cardiovascular: Negative for chest pain and palpitations.  Gastrointestinal: Negative for abdominal pain, constipation, diarrhea, nausea and vomiting.  Endocrine: Negative for cold intolerance, heat intolerance, polydipsia and polyuria.  Musculoskeletal: Negative for arthralgias, back pain, joint swelling and neck pain.  Skin: Negative for rash.  Allergic/Immunologic: Negative for environmental allergies.  Neurological: Negative for dizziness, tremors, numbness and headaches.   Hematological: Negative for adenopathy. Does not bruise/bleed easily.  Psychiatric/Behavioral: Positive for decreased concentration and sleep disturbance. Negative for behavioral problems (Depression) and suicidal ideas. The patient is not nervous/anxious.     Today's Vitals   09/01/19 1004  BP: 122/60  Pulse: 71  Resp: 16  Temp: (!) 97.4 F (36.3 C)  SpO2: 99%  Weight: 198 lb 9.6 oz (90.1 kg)  Height: 5\' 10"  (1.778 m)   Body mass index is 28.5 kg/m.  Physical Exam Vitals and nursing note reviewed.  Constitutional:      General: She is not in acute distress.    Appearance: Normal appearance. She is well-developed. She is not diaphoretic.  HENT:     Head: Normocephalic and atraumatic.     Right Ear: Tympanic membrane is erythematous and bulging.     Left Ear:  Tympanic membrane is erythematous and bulging.     Nose: Congestion present.     Right Sinus: Maxillary sinus tenderness and frontal sinus tenderness present.     Left Sinus: Maxillary sinus tenderness and frontal sinus tenderness present.     Mouth/Throat:     Pharynx: No oropharyngeal exudate.  Eyes:     Pupils: Pupils are equal, round, and reactive to light.  Neck:     Thyroid: No thyromegaly.     Vascular: No JVD.     Trachea: No tracheal deviation.  Cardiovascular:     Rate and Rhythm: Normal rate and regular rhythm.     Heart sounds: Normal heart sounds. No murmur heard.  No friction rub. No gallop.   Pulmonary:     Effort: Pulmonary effort is normal. No respiratory distress.     Breath sounds: Normal breath sounds. No wheezing or rales.  Chest:     Chest wall: No tenderness.  Abdominal:     Palpations: Abdomen is soft.  Musculoskeletal:        General: Normal range of motion.     Cervical back: Normal range of motion and neck supple.  Lymphadenopathy:     Cervical: No cervical adenopathy.  Skin:    General: Skin is warm and dry.  Neurological:     Mental Status: She is alert and oriented to  person, place, and time.     Cranial Nerves: No cranial nerve deficit.  Psychiatric:        Mood and Affect: Mood normal.        Behavior: Behavior normal.        Thought Content: Thought content normal.        Judgment: Judgment normal.    Assessment/Plan: 1. Acute non-recurrent pansinusitis Start z-pack. Take as directed for 5 days. Rest and increase fluids. Recommend she use OTC medication as needed and as indicated to improve symptoms.  - azithromycin (ZITHROMAX) 250 MG tablet; z-pack - take as directed for 5 days  Dispense: 6 tablet; Refill: 0  2. Primary insomnia May take ambien as needed and as prescribed for acute insomnia. New prescription sent to her pharmacy today.  - zolpidem (AMBIEN) 10 MG tablet; Take one tab po qhs as needed, may take one extra if needed  Dispense: 30 tablet; Refill: 3  3. Attention and concentration deficit May take adderall 5mg  up to twice daily as needed for focus and concentration. Three 30 day prescriptions were sent to her pharmacy. Dates are 09/01/2019, 09/30/2019, and 10/29/2019 - amphetamine-dextroamphetamine (ADDERALL) 5 MG tablet; Take one to 2 tabs as needed  Dispense: 60 tablet; Refill: 0  4. Encounter for long-term (current) use of high-risk medication - POCT Urine Drug Screen negative for all controlled substances today.   General Counseling: Nolan verbalizes understanding of the findings of todays visit and agrees with plan of treatment. I have discussed any further diagnostic evaluation that may be needed or ordered today. We also reviewed her medications today. she has been encouraged to call the office with any questions or concerns that should arise related to todays visit.  Refilled Controlled medications today. Reviewed risks and possible side effects associated with taking Stimulants. Combination of these drugs with other psychotropic medications could cause dizziness and drowsiness. Pt needs to Monitor symptoms and exercise caution in  driving and operating heavy machinery to avoid damages to oneself, to others and to the surroundings. Patient verbalized understanding in this matter. Dependence and abuse for these drugs will be  monitored closely. A Controlled substance policy and procedure is on file which allows [facility] to order a urine drug screen test at any visit. Patient understands and agrees with the plan..  This patient was seen by Leretha Pol FNP Collaboration with Dr Lavera Guise as a part of collaborative care agreement  Orders Placed This Encounter  Procedures  . POCT Urine Drug Screen    Meds ordered this encounter  Medications  . azithromycin (ZITHROMAX) 250 MG tablet    Sig: z-pack - take as directed for 5 days    Dispense:  6 tablet    Refill:  0    Order Specific Question:   Supervising Provider    Answer:   Lavera Guise [place]  . DISCONTD: amphetamine-dextroamphetamine (ADDERALL) 5 MG tablet    Sig: Take one to 2 tabs as needed    Dispense:  60 tablet    Refill:  0    Order Specific Question:   Supervising Provider    Answer:   Lavera Guise [place]  . zolpidem (AMBIEN) 10 MG tablet    Sig: Take one tab po qhs as needed, may take one extra if needed    Dispense:  30 tablet    Refill:  3    Order Specific Question:   Supervising Provider    Answer:   Lavera Guise [place]  . DISCONTD: amphetamine-dextroamphetamine (ADDERALL) 5 MG tablet    Sig: Take one to 2 tabs as needed    Dispense:  60 tablet    Refill:  0    Fill after 09/30/2019    Order Specific Question:   Supervising Provider    Answer:   Lavera Guise [place]  . amphetamine-dextroamphetamine (ADDERALL) 5 MG tablet    Sig: Take one to 2 tabs as needed    Dispense:  60 tablet    Refill:  0    Fill after 10/29/2019    Order Specific Question:   Supervising Provider    Answer:   Lavera Guise [[id number]]    Total time spent: 30 Minutes   Time spent includes review of chart, medications, test results, and follow up  plan with the patient.      Dr Lavera Guise Internal medicine

## 2019-09-27 ENCOUNTER — Encounter: Payer: Self-pay | Admitting: Nurse Practitioner

## 2019-09-27 DIAGNOSIS — J0140 Acute pansinusitis, unspecified: Secondary | ICD-10-CM | POA: Insufficient documentation

## 2019-10-16 DIAGNOSIS — S93491A Sprain of other ligament of right ankle, initial encounter: Secondary | ICD-10-CM | POA: Diagnosis not present

## 2019-10-29 ENCOUNTER — Ambulatory Visit: Payer: BLUE CROSS/BLUE SHIELD | Admitting: Hospice and Palliative Medicine

## 2019-10-29 ENCOUNTER — Encounter: Payer: Self-pay | Admitting: Hospice and Palliative Medicine

## 2019-10-29 DIAGNOSIS — F5101 Primary insomnia: Secondary | ICD-10-CM

## 2019-10-29 DIAGNOSIS — J0140 Acute pansinusitis, unspecified: Secondary | ICD-10-CM | POA: Diagnosis not present

## 2019-10-29 MED ORDER — DOXYCYCLINE HYCLATE 100 MG PO TABS: 100.0000 mg | ORAL_TABLET | Freq: Two times a day (BID) | ORAL | 0 refills | Status: DC

## 2019-10-29 NOTE — Progress Notes (Signed)
[facility] [place], [place] [address]  Internal MEDICINE  Telephone Visit  Patient Name: Deanna Sims  [id number]  [id number]  Date of Service: 10/31/2019  I connected with the patient at 1543 by telephone and verified the patients identity using two identifiers.   I discussed the limitations, risks, security and privacy concerns of performing an evaluation and management service by telephone and the availability of in person appointments. I also discussed with the patient that there may be a patient responsible charge related to the service.  The patient expressed understanding and agrees to proceed.    Chief Complaint  Patient presents with  . Telephone Assessment  . Telephone Screen  . Sinusitis    started last monday   . Nasal Congestion  . Sinusitis    HPI Patient is being seen today for a sick visit She complains today of sinus pain and pressure with congestion, cough and headache that has been ongoing since Labor Day She has been treating her symptoms with over the counter medications as this usually resolves her symptoms, but her symptoms seem to persist this time She denies fevers, fatigue or body aches She has not been tested for COVID  Current Medication: Outpatient Encounter Medications as of 10/29/2019  Medication Sig  . amphetamine-dextroamphetamine (ADDERALL) 5 MG tablet Take one to 2 tabs as needed  . fluticasone (FLONASE) 50 MCG/ACT nasal spray Place 1 spray into both nostrils daily as needed for allergies or rhinitis.  Marland Kitchen ibuprofen (ADVIL) 600 MG tablet Take 1 tablet (600 mg total) by mouth every 6 (six) hours.  . SPRINTEC 28 0.25-35 MG-MCG tablet Take 1 tablet by mouth daily.  Marland Kitchen zolpidem (AMBIEN) 10 MG tablet Take one tab po qhs as needed, may take one extra if needed  . [DISCONTINUED] azithromycin (ZITHROMAX) 250 MG tablet z-pack - take as directed for 5 days  . doxycycline (VIBRA-TABS) 100 MG tablet Take 1 tablet (100 mg total) by  mouth 2 (two) times daily.   No facility-administered encounter medications on file as of 10/29/2019.    Surgical History: Past Surgical History:  Procedure Laterality Date  . CESAREAN SECTION N/A 08/11/2015   Procedure: Primary CESAREAN SECTION;  Surgeon: Brien Few, MD;  Location: [facility];  Service: Obstetrics;  Laterality: N/A;  EDD: 08/17/15 Allergy: PCN  . CESAREAN SECTION N/A 08/14/2017   Procedure: Repeat CESAREAN SECTION;  Surgeon: Brien Few, MD;  Location: [facility];  Service: Obstetrics;  Laterality: N/A;  EDD: 08/20/17 Allergy: Penicillin  . DILATION AND EVACUATION N/A 03/15/2014   Procedure: DILATATION AND EVACUATION;  Surgeon: Lyman Speller, MD;  Location: [facility];  Service: Gynecology;  Laterality: N/A;  . LAPAROSCOPY N/A 03/15/2014   Procedure: LAPAROSCOPY OPERATIVE;  Surgeon: Lyman Speller, MD;  Location: [facility];  Service: Gynecology;  Laterality: N/A;  . WISDOM TOOTH EXTRACTION      Medical History: Past Medical History:  Diagnosis Date  . Bell palsy   . Insomnia   . Postpartum care following repeat cesarean delivery (6/29) 08/12/2015  . Seasonal allergies   . Sinusitis     Family History: Family History  Problem Relation Age of Onset  . Uterine cancer Mother 35  . Cancer - Ovarian Maternal Grandmother   . Cancer - Lung Maternal Grandmother   . Cancer - Lung Maternal Grandfather     Social History   Socioeconomic History  . Marital status: Married    Spouse name: Not on file  .  Number of children: Not on file  . Years of education: Not on file  . Highest education level: Not on file  Occupational History  . Not on file  Tobacco Use  . Smoking status: Former Smoker    Packs/day: 1.00    Years: 13.00    Pack years: 13.00    Types: Cigarettes    Quit date: 08/12/2013    Years since quitting: 6.2  . Smokeless tobacco: Never Used  Vaping Use  . Vaping Use: Never used  Substance and Sexual Activity  . Alcohol use: Yes     Alcohol/week: 5.0 standard drinks    Types: 5 Glasses of wine per week    Comment: ocassionally  . Drug use: No  . Sexual activity: Yes    Partners: Male    Birth control/protection: None    Comment: approx 7-[redacted] wks gestation?? per patient  Other Topics Concern  . Not on file  Social History Narrative  . Not on file   Social Determinants of Health   Financial Resource Strain:   . Difficulty of Paying Living Expenses: Not on file  Food Insecurity:   . Worried About Charity fundraiser in the Last Year: Not on file  . Ran Out of Food in the Last Year: Not on file  Transportation Needs:   . Lack of Transportation (Medical): Not on file  . Lack of Transportation (Non-Medical): Not on file  Physical Activity:   . Days of Exercise per Week: Not on file  . Minutes of Exercise per Session: Not on file  Stress:   . Feeling of Stress : Not on file  Social Connections:   . Frequency of Communication with Friends and Family: Not on file  . Frequency of Social Gatherings with Friends and Family: Not on file  . Attends Religious Services: Not on file  . Active Member of Clubs or Organizations: Not on file  . Attends Archivist Meetings: Not on file  . Marital Status: Not on file  Intimate Partner Violence:   . Fear of Current or Ex-Partner: Not on file  . Emotionally Abused: Not on file  . Physically Abused: Not on file  . Sexually Abused: Not on file    Review of Systems  Constitutional: Negative for chills, diaphoresis and fatigue.  HENT: Positive for congestion, sinus pressure, sinus pain and sore throat. Negative for ear pain, postnasal drip, trouble swallowing and voice change.   Eyes: Negative for photophobia, discharge, redness, itching and visual disturbance.  Respiratory: Negative for cough, shortness of breath and wheezing.   Cardiovascular: Negative for chest pain, palpitations and leg swelling.  Gastrointestinal: Negative for abdominal pain, constipation,  diarrhea, nausea and vomiting.  Genitourinary: Negative for dysuria and flank pain.  Musculoskeletal: Negative for arthralgias, back pain, gait problem and neck pain.  Skin: Negative for color change.  Allergic/Immunologic: Negative for environmental allergies and food allergies.  Neurological: Negative for dizziness and headaches.  Hematological: Does not bruise/bleed easily.  Psychiatric/Behavioral: Negative for agitation, behavioral problems (depression) and hallucinations.    Vital Signs: Temp (!) 97.4 F (36.3 C)   Resp 16   Ht 5\' 10"  (1.778 m)   Wt 198 lb (89.8 kg)   BMI 28.41 kg/m    Observation/Objective: No acute distress noted   Assessment/Plan: 1. Acute non-recurrent pansinusitis Will treat symptoms with 7 day course of doxycycline, advised to contact office is symptoms worsened or have not improved after full completion of antibiotic therapy. - doxycycline (  VIBRA-TABS) 100 MG tablet; Take 1 tablet (100 mg total) by mouth 2 (two) times daily.  Dispense: 14 tablet; Refill: 0  2. Primary insomnia Stable at this time, advised to avoid using Ambien with over the counter medications for sleep aid.  Will need to assess for her completion of CPE this year. Will also need to order and review annual labs for this year.   General Counseling: Mabell verbalizes understanding of the findings of today's phone visit and agrees with plan of treatment. I have discussed any further diagnostic evaluation that may be needed or ordered today. We also reviewed her medications today. she has been encouraged to call the office with any questions or concerns that should arise related to todays visit.  Meds ordered this encounter  Medications  . doxycycline (VIBRA-TABS) 100 MG tablet    Sig: Take 1 tablet (100 mg total) by mouth 2 (two) times daily.    Dispense:  14 tablet    Refill:  0     Time spent: [place]. Kerman Pfost AGNP-C Internal medicine

## 2019-10-31 ENCOUNTER — Encounter: Payer: Self-pay | Admitting: Hospice and Palliative Medicine

## 2019-11-30 ENCOUNTER — Ambulatory Visit: Payer: BLUE CROSS/BLUE SHIELD | Admitting: Nurse Practitioner

## 2019-11-30 ENCOUNTER — Encounter: Payer: Self-pay | Admitting: Nurse Practitioner

## 2019-11-30 ENCOUNTER — Other Ambulatory Visit: Payer: Self-pay

## 2019-11-30 VITALS — BP 114/66 | HR 80 | Temp 97.5°F | Resp 16 | Ht 70.0 in | Wt 205.6 lb

## 2019-11-30 DIAGNOSIS — F5101 Primary insomnia: Secondary | ICD-10-CM

## 2019-11-30 DIAGNOSIS — R41840 Attention and concentration deficit: Secondary | ICD-10-CM | POA: Diagnosis not present

## 2019-11-30 DIAGNOSIS — R5383 Other fatigue: Secondary | ICD-10-CM

## 2019-11-30 DIAGNOSIS — Z79899 Other long term (current) drug therapy: Secondary | ICD-10-CM | POA: Diagnosis not present

## 2019-11-30 LAB — POCT URINE DRUG SCREEN
Methylenedioxyamphetamine: NOT DETECTED
POC Amphetamine UR: POSITIVE — AB
POC BENZODIAZEPINES UR: NOT DETECTED
POC Barbiturate UR: NOT DETECTED
POC Cocaine UR: NOT DETECTED
POC Ecstasy UR: NOT DETECTED
POC Marijuana UR: NOT DETECTED
POC Methadone UR: NOT DETECTED
POC Methamphetamine UR: NOT DETECTED
POC Opiate Ur: NOT DETECTED
POC Oxycodone UR: NOT DETECTED
POC PHENCYCLIDINE UR: NOT DETECTED
POC TRICYCLICS UR: NOT DETECTED

## 2019-11-30 MED ORDER — ZOLPIDEM TARTRATE 10 MG PO TABS: ORAL_TABLET | ORAL | 2 refills | Status: DC

## 2019-11-30 MED ORDER — AMPHETAMINE-DEXTROAMPHETAMINE 5 MG PO TABS: ORAL_TABLET | ORAL | 0 refills | Status: DC

## 2019-11-30 NOTE — Progress Notes (Signed)
[facility] [place], [place] [address]  Internal MEDICINE  Office Visit Note  Patient Name: Deanna Sims  [id number]  [id number]  Date of Service: 12/20/2019  Chief Complaint  Patient presents with  . Follow-up  . Medication Refill  . Quality Metric Gaps    flu,tetnaus,covid,  hep C  . controlled substance form    reviewed with PT    The patient presents for routine follow up.  She takes adderall 5mg  twice daily when needed. She does not take this everyday. Does not take on the weekends if she does not have to work.  Today, she was give a Financial planner for ADHD. She has scored 4/6, indicating that she does have adult onset attention deficit witout hyperactivity. She does need to have refills for this today.  She currently takes 10mg  ambien at night, some nights. There are still many nights she is able to cut this in half, and still other nights when she does not need this at all. She does need to have refills for this today.       Current Medication: Outpatient Encounter Medications as of 11/30/2019  Medication Sig  . amphetamine-dextroamphetamine (ADDERALL) 5 MG tablet Take one to 2 tabs as needed  . doxycycline (VIBRA-TABS) 100 MG tablet Take 1 tablet (100 mg total) by mouth 2 (two) times daily.  . fluticasone (FLONASE) 50 MCG/ACT nasal spray Place 1 spray into both nostrils daily as needed for allergies or rhinitis.  Marland Kitchen ibuprofen (ADVIL) 600 MG tablet Take 1 tablet (600 mg total) by mouth every 6 (six) hours.  . SPRINTEC 28 0.25-35 MG-MCG tablet Take 1 tablet by mouth daily.  Marland Kitchen zolpidem (AMBIEN) 10 MG tablet Take one tab po qhs as needed, may take one extra if needed  . [DISCONTINUED] amphetamine-dextroamphetamine (ADDERALL) 5 MG tablet Take one to 2 tabs as needed  . [DISCONTINUED] zolpidem (AMBIEN) 10 MG tablet Take one tab po qhs as needed, may take one extra if needed   No facility-administered encounter medications on file as of  11/30/2019.    Surgical History: Past Surgical History:  Procedure Laterality Date  . CESAREAN SECTION N/A 08/11/2015   Procedure: Primary CESAREAN SECTION;  Surgeon: Brien Few, MD;  Location: [facility];  Service: Obstetrics;  Laterality: N/A;  EDD: 08/17/15 Allergy: PCN  . CESAREAN SECTION N/A 08/14/2017   Procedure: Repeat CESAREAN SECTION;  Surgeon: Brien Few, MD;  Location: [facility];  Service: Obstetrics;  Laterality: N/A;  EDD: 08/20/17 Allergy: Penicillin  . DILATION AND EVACUATION N/A 03/15/2014   Procedure: DILATATION AND EVACUATION;  Surgeon: Lyman Speller, MD;  Location: [facility];  Service: Gynecology;  Laterality: N/A;  . LAPAROSCOPY N/A 03/15/2014   Procedure: LAPAROSCOPY OPERATIVE;  Surgeon: Lyman Speller, MD;  Location: [facility];  Service: Gynecology;  Laterality: N/A;  . WISDOM TOOTH EXTRACTION      Medical History: Past Medical History:  Diagnosis Date  . Bell palsy   . Insomnia   . Postpartum care following repeat cesarean delivery (6/29) 08/12/2015  . Seasonal allergies   . Sinusitis     Family History: Family History  Problem Relation Age of Onset  . Uterine cancer Mother 4  . Cancer - Ovarian Maternal Grandmother   . Cancer - Lung Maternal Grandmother   . Cancer - Lung Maternal Grandfather     Social History   Socioeconomic History  . Marital status: Married    Spouse name: Not on file  .  Number of children: Not on file  . Years of education: Not on file  . Highest education level: Not on file  Occupational History  . Not on file  Tobacco Use  . Smoking status: Former Smoker    Packs/day: 1.00    Years: 13.00    Pack years: 13.00    Types: Cigarettes    Quit date: 08/12/2013    Years since quitting: 6.3  . Smokeless tobacco: Never Used  Vaping Use  . Vaping Use: Never used  Substance and Sexual Activity  . Alcohol use: Yes    Alcohol/week: 5.0 standard drinks    Types: 5 Glasses of wine per week    Comment:  ocassionally  . Drug use: No  . Sexual activity: Yes    Partners: Male    Birth control/protection: None    Comment: approx 7-[redacted] wks gestation?? per patient  Other Topics Concern  . Not on file  Social History Narrative  . Not on file   Social Determinants of Health   Financial Resource Strain:   . Difficulty of Paying Living Expenses: Not on file  Food Insecurity:   . Worried About Charity fundraiser in the Last Year: Not on file  . Ran Out of Food in the Last Year: Not on file  Transportation Needs:   . Lack of Transportation (Medical): Not on file  . Lack of Transportation (Non-Medical): Not on file  Physical Activity:   . Days of Exercise per Week: Not on file  . Minutes of Exercise per Session: Not on file  Stress:   . Feeling of Stress : Not on file  Social Connections:   . Frequency of Communication with Friends and Family: Not on file  . Frequency of Social Gatherings with Friends and Family: Not on file  . Attends Religious Services: Not on file  . Active Member of Clubs or Organizations: Not on file  . Attends Archivist Meetings: Not on file  . Marital Status: Not on file  Intimate Partner Violence:   . Fear of Current or Ex-Partner: Not on file  . Emotionally Abused: Not on file  . Physically Abused: Not on file  . Sexually Abused: Not on file      Review of Systems  Constitutional: Negative for chills, fatigue and unexpected weight change.  HENT: Negative for congestion, postnasal drip, rhinorrhea, sneezing and sore throat.   Respiratory: Negative for cough, chest tightness and shortness of breath.   Cardiovascular: Negative for chest pain and palpitations.  Gastrointestinal: Negative for abdominal pain, constipation, diarrhea, nausea and vomiting.  Endocrine: Negative for cold intolerance, heat intolerance, polydipsia and polyuria.  Musculoskeletal: Negative for arthralgias, back pain, joint swelling and neck pain.  Skin: Negative for rash.   Allergic/Immunologic: Negative for environmental allergies.  Neurological: Negative for dizziness, tremors, numbness and headaches.  Hematological: Negative for adenopathy. Does not bruise/bleed easily.  Psychiatric/Behavioral: Positive for decreased concentration. Negative for behavioral problems (Depression), sleep disturbance and suicidal ideas. The patient is nervous/anxious.     Today's Vitals   11/30/19 1035  BP: 114/66  Pulse: 80  Resp: 16  Temp: (!) 97.5 F (36.4 C)  SpO2: 99%  Weight: 205 lb 9.6 oz (93.3 kg)  Height: 5\' 10"  (1.778 m)   Body mass index is 29.5 kg/m.  Physical Exam Vitals and nursing note reviewed.  Constitutional:      General: She is not in acute distress.    Appearance: Normal appearance. She is well-developed.  She is not diaphoretic.  HENT:     Head: Normocephalic and atraumatic.     Nose: Nose normal.     Mouth/Throat:     Pharynx: No oropharyngeal exudate.  Eyes:     Pupils: Pupils are equal, round, and reactive to light.  Neck:     Thyroid: No thyromegaly.     Vascular: No JVD.     Trachea: No tracheal deviation.  Cardiovascular:     Rate and Rhythm: Normal rate and regular rhythm.     Heart sounds: Normal heart sounds. No murmur heard.  No friction rub. No gallop.   Pulmonary:     Effort: Pulmonary effort is normal. No respiratory distress.     Breath sounds: Normal breath sounds. No wheezing or rales.  Chest:     Chest wall: No tenderness.  Abdominal:     Palpations: Abdomen is soft.  Musculoskeletal:        General: Normal range of motion.     Cervical back: Normal range of motion and neck supple.  Lymphadenopathy:     Cervical: No cervical adenopathy.  Skin:    General: Skin is warm and dry.  Neurological:     General: No focal deficit present.     Mental Status: She is alert and oriented to person, place, and time.     Cranial Nerves: No cranial nerve deficit.  Psychiatric:        Mood and Affect: Mood normal.         Behavior: Behavior normal.        Thought Content: Thought content normal.        Judgment: Judgment normal.     Comments: Patient scoring 4/6 on Conner's self-assessment for adult ADHD.    Assessment/Plan: 1. Other fatigue Stable and likely related to busy lifestyle with two young children. Will monitor.   2. Primary insomnia May continue zolpidem 10g as needed and as prescribed. New prescription sent to her pharmacy today.  - zolpidem (AMBIEN) 10 MG tablet; Take one tab po qhs as needed, may take one extra if needed  Dispense: 30 tablet; Refill: 2  3. Attention and concentration deficit Patient scoring 4/6 on Conner's self-assessment for adult ADHD. Reduce dose adderall to 5mg . Patient may take one to two tablets daily as needed for focus and concentration. Single prescription for #60 tablets sent to her pharmacy today.  - amphetamine-dextroamphetamine (ADDERALL) 5 MG tablet; Take one to 2 tabs as needed  Dispense: 60 tablet; Refill: 0  4. Encounter for long-term (current) use of medications - POCT Urine Drug Screen approppriately positive for AMP only.   General Counseling: Cambri verbalizes understanding of the findings of todays visit and agrees with plan of treatment. I have discussed any further diagnostic evaluation that may be needed or ordered today. We also reviewed her medications today. she has been encouraged to call the office with any questions or concerns that should arise related to todays visit.  Refilled Controlled medications today. Reviewed risks and possible side effects associated with taking Stimulants. Combination of these drugs with other psychotropic medications could cause dizziness and drowsiness. Pt needs to Monitor symptoms and exercise caution in driving and operating heavy machinery to avoid damages to oneself, to others and to the surroundings. Patient verbalized understanding in this matter. Dependence and abuse for these drugs will be monitored closely. A  Controlled substance policy and procedure is on file which allows [facility] to order a urine drug screen test at  any visit. Patient understands and agrees with the plan..  This patient was seen by Leretha Pol FNP Collaboration with Dr Lavera Guise as a part of collaborative care agreement  Orders Placed This Encounter  Procedures  . POCT Urine Drug Screen    Meds ordered this encounter  Medications  . amphetamine-dextroamphetamine (ADDERALL) 5 MG tablet    Sig: Take one to 2 tabs as needed    Dispense:  60 tablet    Refill:  0    Order Specific Question:   Supervising Provider    Answer:   Lavera Guise [place]  . zolpidem (AMBIEN) 10 MG tablet    Sig: Take one tab po qhs as needed, may take one extra if needed    Dispense:  30 tablet    Refill:  2    Order Specific Question:   Supervising Provider    Answer:   Lavera Guise [[id number]]    Total time spent: 30 Minutes   Time spent includes review of chart, medications, test results, and follow up plan with the patient.      Dr Lavera Guise Internal medicine

## 2019-12-20 DIAGNOSIS — Z79899 Other long term (current) drug therapy: Secondary | ICD-10-CM | POA: Insufficient documentation

## 2020-01-01 ENCOUNTER — Ambulatory Visit: Payer: BLUE CROSS/BLUE SHIELD | Admitting: Nurse Practitioner

## 2020-01-13 ENCOUNTER — Other Ambulatory Visit: Payer: Self-pay

## 2020-01-13 ENCOUNTER — Ambulatory Visit: Payer: BLUE CROSS/BLUE SHIELD | Admitting: Hospice and Palliative Medicine

## 2020-01-13 ENCOUNTER — Encounter: Payer: Self-pay | Admitting: Hospice and Palliative Medicine

## 2020-01-13 VITALS — BP 130/76 | HR 72 | Temp 97.8°F | Resp 16 | Ht 70.0 in | Wt 207.0 lb

## 2020-01-13 DIAGNOSIS — R519 Headache, unspecified: Secondary | ICD-10-CM | POA: Diagnosis not present

## 2020-01-13 DIAGNOSIS — R41840 Attention and concentration deficit: Secondary | ICD-10-CM

## 2020-01-13 DIAGNOSIS — Z0001 Encounter for general adult medical examination with abnormal findings: Secondary | ICD-10-CM | POA: Diagnosis not present

## 2020-01-13 DIAGNOSIS — F5101 Primary insomnia: Secondary | ICD-10-CM | POA: Diagnosis not present

## 2020-01-13 MED ORDER — AMPHETAMINE-DEXTROAMPHETAMINE 5 MG PO TABS: ORAL_TABLET | ORAL | 0 refills | Status: DC

## 2020-01-13 MED ORDER — ZOLPIDEM TARTRATE 10 MG PO TABS: ORAL_TABLET | ORAL | 2 refills | Status: DC

## 2020-01-13 NOTE — Progress Notes (Signed)
[facility] [place], [place] [address]  Internal MEDICINE  Office Visit Note  Patient Name: Deanna Sims  [id number]  [id number]  Date of Service: 01/17/2020  Chief Complaint  Patient presents with  . Follow-up    med refills for adderall and ambien   . controlled substance policy update    reviewed    HPI Patient is here for routine follow-up Requesting refills of Adderall and Ambien Currently on low dose Adderall, primarily takes one tablet, will occasionally take 2 tablets, only takes them when she is working, routinely does not take Adderall on the weekends Symptoms remain well controlled on current schedule and dose Continues to use Ambien to help with insomnia, takes as needed, insomnia occurs mostly during stressful or busy times during work  Concerned today about recurring symptoms of Bell's Palsy--was originally diagnosed in 2019 after her second pregnancy Over the last few weeks she has noticed tingling in her cheeks, but she is also starting to have headaches Symptoms are vague but similar to those she experienced with original diagnosis After original diagnosis she was treated with anti-viral and symptoms resolved, has not had recurring symptoms since a few weeks prior  Current Medication: Outpatient Encounter Medications as of 01/13/2020  Medication Sig  . amphetamine-dextroamphetamine (ADDERALL) 5 MG tablet Take one to 2 tabs as needed  . fluticasone (FLONASE) 50 MCG/ACT nasal spray Place 1 spray into both nostrils daily as needed for allergies or rhinitis.  Marland Kitchen ibuprofen (ADVIL) 600 MG tablet Take 1 tablet (600 mg total) by mouth every 6 (six) hours.  . SPRINTEC 28 0.25-35 MG-MCG tablet Take 1 tablet by mouth daily.  Marland Kitchen zolpidem (AMBIEN) 10 MG tablet Take one tab po qhs as needed, may take one extra if needed  . [DISCONTINUED] amphetamine-dextroamphetamine (ADDERALL) 5 MG tablet Take one to 2 tabs as needed  . [DISCONTINUED]  amphetamine-dextroamphetamine (ADDERALL) 5 MG tablet Take one to 2 tabs as needed  . [DISCONTINUED] amphetamine-dextroamphetamine (ADDERALL) 5 MG tablet Take one to 2 tabs as needed  . [DISCONTINUED] doxycycline (VIBRA-TABS) 100 MG tablet Take 1 tablet (100 mg total) by mouth 2 (two) times daily.  . [DISCONTINUED] zolpidem (AMBIEN) 10 MG tablet Take one tab po qhs as needed, may take one extra if needed   No facility-administered encounter medications on file as of 01/13/2020.    Surgical History: Past Surgical History:  Procedure Laterality Date  . CESAREAN SECTION N/A 08/11/2015   Procedure: Primary CESAREAN SECTION;  Surgeon: Brien Few, MD;  Location: [facility];  Service: Obstetrics;  Laterality: N/A;  EDD: 08/17/15 Allergy: PCN  . CESAREAN SECTION N/A 08/14/2017   Procedure: Repeat CESAREAN SECTION;  Surgeon: Brien Few, MD;  Location: [place] [facility];  Service: Obstetrics;  Laterality: N/A;  EDD: 08/20/17 Allergy: Penicillin  . DILATION AND EVACUATION N/A 03/15/2014   Procedure: DILATATION AND EVACUATION;  Surgeon: Lyman Speller, MD;  Location: [facility];  Service: Gynecology;  Laterality: N/A;  . LAPAROSCOPY N/A 03/15/2014   Procedure: LAPAROSCOPY OPERATIVE;  Surgeon: Lyman Speller, MD;  Location: [facility];  Service: Gynecology;  Laterality: N/A;  . WISDOM TOOTH EXTRACTION      Medical History: Past Medical History:  Diagnosis Date  . Bell palsy   . Insomnia   . Postpartum care following repeat cesarean delivery (6/29) 08/12/2015  . Seasonal allergies   . Sinusitis     Family History: Family History  Problem Relation Age of Onset  . Uterine cancer Mother  85  . Cancer - Ovarian Maternal Grandmother   . Cancer - Lung Maternal Grandmother   . Cancer - Lung Maternal Grandfather     Social History   Socioeconomic History  . Marital status: Married    Spouse name: Not on file  . Number of children: Not on file  . Years of education: Not on file  .  Highest education level: Not on file  Occupational History  . Not on file  Tobacco Use  . Smoking status: Former Smoker    Packs/day: 1.00    Years: 13.00    Pack years: 13.00    Types: Cigarettes    Quit date: 08/12/2013    Years since quitting: 6.4  . Smokeless tobacco: Never Used  Vaping Use  . Vaping Use: Never used  Substance and Sexual Activity  . Alcohol use: Yes    Alcohol/week: 5.0 standard drinks    Types: 5 Glasses of wine per week    Comment: ocassionally  . Drug use: No  . Sexual activity: Yes    Partners: Male    Birth control/protection: None    Comment: approx 7-[redacted] wks gestation?? per patient  Other Topics Concern  . Not on file  Social History Narrative  . Not on file   Social Determinants of Health   Financial Resource Strain:   . Difficulty of Paying Living Expenses: Not on file  Food Insecurity:   . Worried About Charity fundraiser in the Last Year: Not on file  . Ran Out of Food in the Last Year: Not on file  Transportation Needs:   . Lack of Transportation (Medical): Not on file  . Lack of Transportation (Non-Medical): Not on file  Physical Activity:   . Days of Exercise per Week: Not on file  . Minutes of Exercise per Session: Not on file  Stress:   . Feeling of Stress : Not on file  Social Connections:   . Frequency of Communication with Friends and Family: Not on file  . Frequency of Social Gatherings with Friends and Family: Not on file  . Attends Religious Services: Not on file  . Active Member of Clubs or Organizations: Not on file  . Attends Archivist Meetings: Not on file  . Marital Status: Not on file  Intimate Partner Violence:   . Fear of Current or Ex-Partner: Not on file  . Emotionally Abused: Not on file  . Physically Abused: Not on file  . Sexually Abused: Not on file      Review of Systems  Constitutional: Negative for chills, diaphoresis and fatigue.  HENT: Negative for ear pain, postnasal drip and sinus  pressure.   Eyes: Negative for photophobia, discharge, redness, itching and visual disturbance.  Respiratory: Negative for cough, shortness of breath and wheezing.   Cardiovascular: Negative for chest pain, palpitations and leg swelling.  Gastrointestinal: Negative for abdominal pain, constipation, diarrhea, nausea and vomiting.  Genitourinary: Negative for dysuria and flank pain.  Musculoskeletal: Negative for arthralgias, back pain, gait problem and neck pain.  Skin: Negative for color change.  Allergic/Immunologic: Negative for environmental allergies and food allergies.  Neurological: Positive for headaches. Negative for dizziness.       Intermittent tingling sensation of face  Hematological: Does not bruise/bleed easily.  Psychiatric/Behavioral: Positive for sleep disturbance. Negative for agitation, behavioral problems (depression) and hallucinations.    Vital Signs: BP 130/76   Pulse 72   Temp 97.8 F (36.6 C)   Resp 16  Ht 5\' 10"  (1.778 m)   Wt 207 lb (93.9 kg)   SpO2 99%   BMI 29.70 kg/m    Physical Exam Vitals reviewed.  Constitutional:      Appearance: Normal appearance. She is normal weight.  Cardiovascular:     Rate and Rhythm: Normal rate and regular rhythm.     Pulses: Normal pulses.     Heart sounds: Normal heart sounds.  Pulmonary:     Effort: Pulmonary effort is normal.     Breath sounds: Normal breath sounds.  Abdominal:     General: Abdomen is flat.     Palpations: Abdomen is soft.  Musculoskeletal:        General: Normal range of motion.     Cervical back: Normal range of motion.  Skin:    General: Skin is warm.  Neurological:     General: No focal deficit present.     Mental Status: She is alert and oriented to person, place, and time. Mental status is at baseline.  Psychiatric:        Mood and Affect: Mood normal.        Behavior: Behavior normal.        Thought Content: Thought content normal.    PDMP reviewed, narcotic score 90,  sedative score 200, stimulant score 230, overdose risk 10  Assessment/Plan: 1. Nonintractable episodic headache, unspecified headache type Headache associated with neurological symptoms vs recurrent symptoms of Bell's Palsy Will obtain MRI and adjust plan of care as indicated - MR Brain W Wo Contrast; Future  2. Primary insomnia Insomnia remains well controlled on Ambien, requesting refills May need to consider overnight pulse oximetry and /or treatment for anxiety if use of Ambien increases in frequency - zolpidem (AMBIEN) 10 MG tablet; Take one tab po qhs as needed, may take one extra if needed  Dispense: 30 tablet; Refill: 2  3. Attention and concentration deficit Continue with current dose and frequency of Adderall 3 refills given today: today, 12/30, 01/30 - amphetamine-dextroamphetamine (ADDERALL) 5 MG tablet; Take one to 2 tabs as needed  Dispense: 60 tablet; Refill: 0  4. Encounter for routine adult health examination with abnormal findings Labs ordered for upcoming CPE - CBC w/Diff/Platelet - Comprehensive Metabolic Panel (CMET) - Lipid Panel With LDL/HDL Ratio - TSH + free T4 - B12 - Vitamin D (25 hydroxy)  General Counseling: Maddilyn verbalizes understanding of the findings of todays visit and agrees with plan of treatment. I have discussed any further diagnostic evaluation that may be needed or ordered today. We also reviewed her medications today. she has been encouraged to call the office with any questions or concerns that should arise related to todays visit.    Orders Placed This Encounter  Procedures  . MR Brain W Wo Contrast  . CBC w/Diff/Platelet  . Comprehensive Metabolic Panel (CMET)  . Lipid Panel With LDL/HDL Ratio  . TSH + free T4  . B12  . Vitamin D (25 hydroxy)    Meds ordered this encounter  Medications  . zolpidem (AMBIEN) 10 MG tablet    Sig: Take one tab po qhs as needed, may take one extra if needed    Dispense:  30 tablet    Refill:  2  .  DISCONTD: amphetamine-dextroamphetamine (ADDERALL) 5 MG tablet    Sig: Take one to 2 tabs as needed    Dispense:  60 tablet    Refill:  0  . DISCONTD: amphetamine-dextroamphetamine (ADDERALL) 5 MG tablet  Sig: Take one to 2 tabs as needed    Dispense:  60 tablet    Refill:  0    Do not fill before 12/31  . amphetamine-dextroamphetamine (ADDERALL) 5 MG tablet    Sig: Take one to 2 tabs as needed    Dispense:  60 tablet    Refill:  0    Do not fill before 01/30    Time spent: 30 Minutes Time spent includes review of chart, medications, test results and follow-up plan with the patient.  This patient was seen by Theodoro Grist AGNP-C in Collaboration with Dr Lavera Guise as a part of collaborative care agreement     Deanna Sims AGNP-C Internal medicine

## 2020-01-17 ENCOUNTER — Encounter: Payer: Self-pay | Admitting: Hospice and Palliative Medicine

## 2020-01-29 ENCOUNTER — Other Ambulatory Visit: Payer: Self-pay | Admitting: Nurse Practitioner

## 2020-01-29 ENCOUNTER — Telehealth: Payer: Self-pay

## 2020-01-29 DIAGNOSIS — J0140 Acute pansinusitis, unspecified: Secondary | ICD-10-CM

## 2020-01-29 MED ORDER — AZITHROMYCIN 250 MG PO TABS: ORAL_TABLET | ORAL | 0 refills | Status: DC

## 2020-01-29 NOTE — Telephone Encounter (Signed)
I just sent her in a z-pack to take for sinus infection. She should take as directed. She needs to continue oer the counter medications as indicated to help congestion. I suggest mucinex severe congestion. She should also rest and increase her fluid intake.

## 2020-01-29 NOTE — Telephone Encounter (Signed)
LMOM again. 

## 2020-01-29 NOTE — Telephone Encounter (Signed)
Pt wants something called in for her congestion, and sinus headache. She has tried OTC meds for a week and has had no relief.

## 2020-01-29 NOTE — Telephone Encounter (Signed)
LMOM letting her know meds were sent to pharmacy, she should continue OTC meds, and she should rest and increase fluid intake.

## 2020-02-01 NOTE — Telephone Encounter (Signed)
Spoke with pt, she did pick up her meds and is already starting to feel better.

## 2020-02-03 ENCOUNTER — Telehealth: Payer: Self-pay

## 2020-02-03 NOTE — Telephone Encounter (Signed)
LMOM about insurance denial for MRI

## 2020-02-22 ENCOUNTER — Other Ambulatory Visit: Payer: Self-pay

## 2020-02-22 MED ORDER — IBUPROFEN 600 MG PO TABS
600.0000 mg | ORAL_TABLET | Freq: Four times a day (QID) | ORAL | 1 refills | Status: DC
Start: 2020-02-22 — End: 2020-12-27

## 2020-03-01 ENCOUNTER — Ambulatory Visit
Admission: RE | Admit: 2020-03-01 | Discharge: 2020-03-01 | Disposition: A | Discharge status: Home / Self Care | Payer: No Typology Code available for payment source | Attending: Hospice and Palliative Medicine | Admitting: Hospice and Palliative Medicine

## 2020-03-01 ENCOUNTER — Other Ambulatory Visit: Payer: Self-pay

## 2020-03-01 ENCOUNTER — Encounter: Payer: Self-pay | Admitting: Hospice and Palliative Medicine

## 2020-03-01 ENCOUNTER — Ambulatory Visit
Admission: RE | Admit: 2020-03-01 | Discharge: 2020-03-01 | Disposition: A | Discharge status: Home / Self Care | Payer: No Typology Code available for payment source | Source: Ambulatory Visit | Attending: Hospice and Palliative Medicine | Admitting: Hospice and Palliative Medicine

## 2020-03-01 ENCOUNTER — Ambulatory Visit (INDEPENDENT_AMBULATORY_CARE_PROVIDER_SITE_OTHER): Payer: BC Managed Care – PPO | Admitting: Hospice and Palliative Medicine

## 2020-03-01 VITALS — BP 105/68 | HR 79 | Temp 97.4°F | Resp 16 | Ht 70.0 in | Wt 210.0 lb

## 2020-03-01 DIAGNOSIS — R059 Cough, unspecified: Secondary | ICD-10-CM | POA: Diagnosis not present

## 2020-03-01 DIAGNOSIS — Z8616 Personal history of COVID-19: Secondary | ICD-10-CM

## 2020-03-01 MED ORDER — DOXYCYCLINE HYCLATE 100 MG PO TABS: 100.0000 mg | ORAL_TABLET | Freq: Two times a day (BID) | ORAL | 0 refills | Status: DC

## 2020-03-01 MED ORDER — ALBUTEROL SULFATE HFA 108 (90 BASE) MCG/ACT IN AERS: 2.0000 | INHALATION_SPRAY | Freq: Four times a day (QID) | RESPIRATORY_TRACT | 2 refills | Status: DC | PRN

## 2020-03-01 MED ORDER — METHYLPREDNISOLONE ACETATE 80 MG/ML IJ SUSP
80.0000 mg | Freq: Once | INTRAMUSCULAR | Status: AC
  Administered 2020-03-01: 80 mg via INTRAMUSCULAR

## 2020-03-01 NOTE — Progress Notes (Signed)
[facility] [place], [place] [address]  Internal MEDICINE  Office Visit Note  Patient Name: Deanna Sims  [id number]  [id number]  Date of Service: 03/01/2020  Chief Complaint  Patient presents with  . Acute Visit  . Cough  . Nasal Congestion  . Shortness of Breath  . Sore Throat     HPI Pt is here for a sick visit. Tested positive for COVID earlier this month, symptoms continue to linger C/o chest congestion, coughing, sore throat and shortness of breath Symptoms are progressing, she has been taking OTC medications for her symptoms without much improvement Concerned about developing pneumonia, complains of chest pressure when she is coughing    Current Medication:  Outpatient Encounter Medications as of 03/01/2020  Medication Sig  . albuterol (VENTOLIN HFA) 108 (90 Base) MCG/ACT inhaler Inhale 2 puffs into the lungs every 6 (six) hours as needed for wheezing or shortness of breath.  . doxycycline (VIBRA-TABS) 100 MG tablet Take 1 tablet (100 mg total) by mouth 2 (two) times daily.  Marland Kitchen amphetamine-dextroamphetamine (ADDERALL) 5 MG tablet Take one to 2 tabs as needed  . fluticasone (FLONASE) 50 MCG/ACT nasal spray Place 1 spray into both nostrils daily as needed for allergies or rhinitis.  Marland Kitchen ibuprofen (ADVIL) 600 MG tablet Take 1 tablet (600 mg total) by mouth every 6 (six) hours.  . SPRINTEC 28 0.25-35 MG-MCG tablet Take 1 tablet by mouth daily.  Marland Kitchen zolpidem (AMBIEN) 10 MG tablet Take one tab po qhs as needed, may take one extra if needed  . [DISCONTINUED] azithromycin (ZITHROMAX) 250 MG tablet z-pack - take as directed for 5 days  . [EXPIRED] methylPREDNISolone acetate (DEPO-MEDROL) injection 80 mg    No facility-administered encounter medications on file as of 03/01/2020.      Medical History: Past Medical History:  Diagnosis Date  . Bell palsy   . Insomnia   . Postpartum care following repeat cesarean delivery (6/29) 08/12/2015  .  Seasonal allergies   . Sinusitis      Vital Signs: BP 105/68   Pulse 79   Temp (!) 97.4 F (36.3 C)   Resp 16   Ht 5\' 10"  (1.778 m)   Wt 210 lb (95.3 kg)   LMP 02/13/2020   SpO2 98%   BMI 30.13 kg/m    Review of Systems  Constitutional: Positive for fatigue. Negative for chills and diaphoresis.  HENT: Positive for congestion, sinus pressure, sinus pain, sore throat and voice change. Negative for ear pain and postnasal drip.   Eyes: Negative for photophobia, discharge, redness, itching and visual disturbance.  Respiratory: Positive for cough and shortness of breath. Negative for wheezing.   Cardiovascular: Negative for chest pain, palpitations and leg swelling.  Gastrointestinal: Negative for abdominal pain, constipation, diarrhea, nausea and vomiting.  Genitourinary: Negative for dysuria and flank pain.  Musculoskeletal: Negative for arthralgias, back pain, gait problem and neck pain.  Skin: Negative for color change.  Allergic/Immunologic: Negative for environmental allergies and food allergies.  Neurological: Negative for dizziness and headaches.  Hematological: Does not bruise/bleed easily.  Psychiatric/Behavioral: Negative for agitation, behavioral problems (depression) and hallucinations.    Physical Exam Vitals reviewed.  Constitutional:      Appearance: Normal appearance. She is well-developed and normal weight.  HENT:     Nose: Congestion present.     Mouth/Throat:     Mouth: Mucous membranes are moist.     Pharynx: Oropharynx is clear.  Eyes:     Pupils: Pupils  are equal, round, and reactive to light.  Cardiovascular:     Rate and Rhythm: Normal rate and regular rhythm.     Pulses: Normal pulses.     Heart sounds: Normal heart sounds.  Pulmonary:     Effort: Pulmonary effort is normal.     Breath sounds: Normal breath sounds.  Abdominal:     General: Abdomen is flat.     Palpations: Abdomen is soft.  Musculoskeletal:        General: Normal range of  motion.     Cervical back: Normal range of motion.  Skin:    General: Skin is warm.  Neurological:     General: No focal deficit present.     Mental Status: She is alert and oriented to person, place, and time. Mental status is at baseline.  Psychiatric:        Mood and Affect: Mood normal.        Behavior: Behavior normal.        Thought Content: Thought content normal.        Judgment: Judgment normal.    Assessment/Plan: 1. Personal history of COVID-19 Start doxycycline for prolonged symptoms secondary to COVID Review CXR for presence of PNU Use albuterol as needed for shortness of breath - albuterol (VENTOLIN HFA) 108 (90 Base) MCG/ACT inhaler; Inhale 2 puffs into the lungs every 6 (six) hours as needed for wheezing or shortness of breath.  Dispense: 8 g; Refill: 2 - doxycycline (VIBRA-TABS) 100 MG tablet; Take 1 tablet (100 mg total) by mouth 2 (two) times daily.  Dispense: 20 tablet; Refill: 0  2. Cough Follow-up on CXR Encouraged to return to clinic tomorrow if symptoms persist and have not improved for second IM dose - DG Chest 2 View; Future - DG Chest 2 View - methylPREDNISolone acetate (DEPO-MEDROL) injection 80 mg  General Counseling: Deanna Sims verbalizes understanding of the findings of todays visit and agrees with plan of treatment. I have discussed any further diagnostic evaluation that may be needed or ordered today. We also reviewed her medications today. she has been encouraged to call the office with any questions or concerns that should arise related to todays visit.   Orders Placed This Encounter  Procedures  . DG Chest 2 View    Meds ordered this encounter  Medications  . albuterol (VENTOLIN HFA) 108 (90 Base) MCG/ACT inhaler    Sig: Inhale 2 puffs into the lungs every 6 (six) hours as needed for wheezing or shortness of breath.    Dispense:  8 g    Refill:  2  . doxycycline (VIBRA-TABS) 100 MG tablet    Sig: Take 1 tablet (100 mg total) by mouth 2 (two)  times daily.    Dispense:  20 tablet    Refill:  0  . methylPREDNISolone acetate (DEPO-MEDROL) injection 80 mg    Time spent: 30 Minutes Time spent includes review of chart, medications, test results and follow-up plan with the patient.  This patient was seen by Theodoro Grist AGNP-C in Collaboration with Dr Lavera Guise as a part of collaborative care agreement.  Tanna Furry [facility] Internal Medicine

## 2020-03-01 NOTE — Progress Notes (Signed)
Called and discussed CXR with her, no acute findings. Continue with current plan of care.

## 2020-03-04 ENCOUNTER — Ambulatory Visit (INDEPENDENT_AMBULATORY_CARE_PROVIDER_SITE_OTHER): Payer: BC Managed Care – PPO

## 2020-03-04 ENCOUNTER — Encounter: Payer: BLUE CROSS/BLUE SHIELD | Admitting: Nurse Practitioner

## 2020-03-04 DIAGNOSIS — R059 Cough, unspecified: Secondary | ICD-10-CM

## 2020-03-04 MED ORDER — METHYLPREDNISOLONE ACETATE 80 MG/ML IJ SUSP
80.0000 mg | Freq: Once | INTRAMUSCULAR | Status: AC
  Administered 2020-03-04: 80 mg via INTRAMUSCULAR

## 2020-03-07 DIAGNOSIS — Z0001 Encounter for general adult medical examination with abnormal findings: Secondary | ICD-10-CM | POA: Diagnosis not present

## 2020-03-08 LAB — LIPID PANEL WITH LDL/HDL RATIO
Cholesterol, Total: 191 mg/dL (ref 100–199)
HDL: 62 mg/dL (ref >39)
LDL Chol Calc (NIH): 100 mg/dL — ABNORMAL HIGH (ref 0–99)
LDL/HDL Ratio: 1.6 ratio (ref 0.0–3.2)
Triglycerides: 172 mg/dL — ABNORMAL HIGH (ref 0–149)
VLDL Cholesterol Cal: 29 mg/dL (ref 5–40)

## 2020-03-08 LAB — CBC WITH DIFFERENTIAL/PLATELET
Basophils Absolute: 0 10*3/uL (ref 0.0–0.2)
Basos: 0 %
EOS (ABSOLUTE): 0.3 10*3/uL (ref 0.0–0.4)
Eos: 3 %
Hematocrit: 41 % (ref 34.0–46.6)
Hemoglobin: 13.6 g/dL (ref 11.1–15.9)
Immature Grans (Abs): 0.1 10*3/uL (ref 0.0–0.1)
Immature Granulocytes: 1 %
Lymphocytes Absolute: 2.3 10*3/uL (ref 0.7–3.1)
Lymphs: 23 %
MCH: 30.1 pg (ref 26.6–33.0)
MCHC: 33.2 g/dL (ref 31.5–35.7)
MCV: 91 fL (ref 79–97)
Monocytes Absolute: 0.5 10*3/uL (ref 0.1–0.9)
Monocytes: 5 %
Neutrophils Absolute: 6.8 10*3/uL (ref 1.4–7.0)
Neutrophils: 68 %
Platelets: 361 10*3/uL (ref 150–450)
RBC: 4.52 x10E6/uL (ref 3.77–5.28)
RDW: 11.9 % (ref 11.7–15.4)
WBC: 10 10*3/uL (ref 3.4–10.8)

## 2020-03-08 LAB — TSH+FREE T4
Free T4: 1.2 ng/dL (ref 0.82–1.77)
TSH: 0.934 u[IU]/mL (ref 0.450–4.500)

## 2020-03-08 LAB — COMPREHENSIVE METABOLIC PANEL
ALT: 10 IU/L (ref 0–32)
AST: 12 IU/L (ref 0–40)
Albumin/Globulin Ratio: 1.6 (ref 1.2–2.2)
Albumin: 4.3 g/dL (ref 3.8–4.8)
Alkaline Phosphatase: 68 IU/L (ref 44–121)
BUN/Creatinine Ratio: 17 (ref 9–23)
BUN: 13 mg/dL (ref 6–24)
Bilirubin Total: 0.3 mg/dL (ref 0.0–1.2)
CO2: 23 mmol/L (ref 20–29)
Calcium: 9.5 mg/dL (ref 8.7–10.2)
Chloride: 105 mmol/L (ref 96–106)
Creatinine, Ser: 0.75 mg/dL (ref 0.57–1.00)
GFR calc Af Amer: 112 mL/min/{1.73_m2} (ref >59)
GFR calc non Af Amer: 97 mL/min/{1.73_m2} (ref >59)
Globulin, Total: 2.7 g/dL (ref 1.5–4.5)
Glucose: 102 mg/dL — ABNORMAL HIGH (ref 65–99)
Potassium: 4.7 mmol/L (ref 3.5–5.2)
Sodium: 142 mmol/L (ref 134–144)
Total Protein: 7 g/dL (ref 6.0–8.5)

## 2020-03-08 LAB — VITAMIN B12: Vitamin B-12: 618 pg/mL (ref 232–1245)

## 2020-03-08 LAB — VITAMIN D 25 HYDROXY (VIT D DEFICIENCY, FRACTURES): Vit D, 25-Hydroxy: 49.3 ng/mL (ref 30.0–100.0)

## 2020-03-09 ENCOUNTER — Encounter: Payer: BLUE CROSS/BLUE SHIELD | Admitting: Hospice and Palliative Medicine

## 2020-03-10 ENCOUNTER — Ambulatory Visit (INDEPENDENT_AMBULATORY_CARE_PROVIDER_SITE_OTHER): Payer: BC Managed Care – PPO | Admitting: Hospice and Palliative Medicine

## 2020-03-10 ENCOUNTER — Encounter: Payer: Self-pay | Admitting: Hospice and Palliative Medicine

## 2020-03-10 VITALS — BP 104/59 | HR 77 | Temp 97.6°F | Resp 16 | Ht 70.0 in | Wt 213.0 lb

## 2020-03-10 DIAGNOSIS — Z79899 Other long term (current) drug therapy: Secondary | ICD-10-CM | POA: Diagnosis not present

## 2020-03-10 DIAGNOSIS — Z0001 Encounter for general adult medical examination with abnormal findings: Secondary | ICD-10-CM

## 2020-03-10 DIAGNOSIS — Z8669 Personal history of other diseases of the nervous system and sense organs: Secondary | ICD-10-CM | POA: Diagnosis not present

## 2020-03-10 DIAGNOSIS — G44019 Episodic cluster headache, not intractable: Secondary | ICD-10-CM

## 2020-03-10 NOTE — Progress Notes (Signed)
[facility] [place], [place] [address]  Internal MEDICINE  Office Visit Note  Patient Name: Deanna Sims  [id number]  [id number]  Date of Service: 03/12/2020  Chief Complaint  Patient presents with  . Annual Exam  . Allergies     HPI Pt is here for routine health maintenance examination She is feeling much better, recently had COVID19 and bronchitis, treated with zpak as well as depo medrol Reviewed recent labs--slightly abnormal lipid panel  Continues to have neurological complaints--unable to get MRI approved from last visit July 2019 after the birth of her second child she was diagnosed with Bells Palsy Treated with anti-viral as well as prednisone Recurrent intermittent symptoms since this time last year HA, visual disturbances, ringing in ear all on left side--symptoms are intermittent, occurs about a couple of times a month--3-4 times Frontal headaches above left eye and blurred vision in left eye Headaches typically well controlled with ibuprofen  Continues to use Adderall as needed to help with focus and concentration when working 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 albuterol (VENTOLIN HFA) 108 (90 Base) MCG/ACT inhaler Inhale 2 puffs into the lungs every 6 (six) hours as needed for wheezing or shortness of breath.  . amphetamine-dextroamphetamine (ADDERALL) 5 MG tablet Take one to 2 tabs as needed  . fluticasone (FLONASE) 50 MCG/ACT nasal spray Place 1 spray into both nostrils daily as needed for allergies or rhinitis.  Marland Kitchen ibuprofen (ADVIL) 600 MG tablet Take 1 tablet (600 mg total) by mouth every 6 (six) hours.  . SPRINTEC 28 0.25-35 MG-MCG tablet Take 1 tablet by mouth daily.  Marland Kitchen zolpidem (AMBIEN) 10 MG  tablet Take one tab po qhs as needed, may take one extra if needed  . [DISCONTINUED] doxycycline (VIBRA-TABS) 100 MG tablet Take 1 tablet (100 mg total) by mouth 2 (two) times daily.   No facility-administered encounter medications on file as of 03/10/2020.    Surgical History: Past Surgical History:  Procedure Laterality Date  . CESAREAN SECTION N/A 08/11/2015   Procedure: Primary CESAREAN SECTION;  Surgeon: Brien Few, MD;  Location: [facility];  Service: Obstetrics;  Laterality: N/A;  EDD: 08/17/15 Allergy: PCN  . CESAREAN SECTION N/A 08/14/2017   Procedure: Repeat CESAREAN SECTION;  Surgeon: Brien Few, MD;  Location: [facility];  Service: Obstetrics;  Laterality: N/A;  EDD: 08/20/17 Allergy: Penicillin  . DILATION AND EVACUATION N/A 03/15/2014   Procedure: DILATATION AND EVACUATION;  Surgeon: Lyman Speller, MD;  Location: [facility];  Service: Gynecology;  Laterality: N/A;  . LAPAROSCOPY N/A 03/15/2014   Procedure: LAPAROSCOPY OPERATIVE;  Surgeon: Lyman Speller, MD;  Location: [facility];  Service: Gynecology;  Laterality: N/A;  . WISDOM TOOTH EXTRACTION      Medical History: Past Medical History:  Diagnosis Date  . Bell palsy   . Insomnia   . Postpartum care following repeat cesarean delivery (6/29) 08/12/2015  . Seasonal allergies   . Sinusitis     Family History: Family History  Problem Relation Age of Onset  . Uterine cancer Mother 1  . Cancer - Ovarian Maternal Grandmother   . Cancer - Lung Maternal Grandmother   . Cancer - Lung Maternal Grandfather       Review of  Systems  Constitutional: Negative for chills, diaphoresis and fatigue.  HENT: Positive for tinnitus. Negative for ear pain, postnasal drip and sinus pressure.        Left ear tinnitus  Eyes: Positive for visual disturbance. Negative for photophobia, discharge, redness and itching.       Intermittent blurred vision in left eye  Respiratory: Negative for cough, shortness of breath  and wheezing.   Cardiovascular: Negative for chest pain, palpitations and leg swelling.  Gastrointestinal: Negative for abdominal pain, constipation, diarrhea, nausea and vomiting.  Genitourinary: Negative for dysuria and flank pain.  Musculoskeletal: Negative for arthralgias, back pain, gait problem and neck pain.  Skin: Negative for color change.  Allergic/Immunologic: Negative for environmental allergies and food allergies.  Neurological: Positive for headaches. Negative for dizziness.  Hematological: Does not bruise/bleed easily.  Psychiatric/Behavioral: Negative for agitation, behavioral problems (depression) and hallucinations.     Vital Signs: BP (!) 104/59   Pulse 77   Temp 97.6 F (36.4 C)   Resp 16   Ht $R'5\' 10"'Yu$  (1.778 m)   Wt 213 lb (96.6 kg)   LMP 02/13/2020   SpO2 98%   BMI 30.56 kg/m    Physical Exam Vitals reviewed.  Constitutional:      Appearance: Normal appearance. She is normal weight.  Cardiovascular:     Rate and Rhythm: Normal rate and regular rhythm.     Pulses: Normal pulses.     Heart sounds: Normal heart sounds.  Pulmonary:     Effort: Pulmonary effort is normal.     Breath sounds: Normal breath sounds.  Abdominal:     General: Abdomen is flat.     Palpations: Abdomen is soft.  Musculoskeletal:        General: Normal range of motion.     Cervical back: Normal range of motion.  Skin:    General: Skin is warm.  Neurological:     General: No focal deficit present.     Mental Status: She is alert and oriented to person, place, and time. Mental status is at baseline.  Psychiatric:        Mood and Affect: Mood normal.        Behavior: Behavior normal.        Thought Content: Thought content normal.        Judgment: Judgment normal.      LABS: Recent Results (from the past 2160 hour(s))  CBC w/Diff/Platelet     Status: None   Collection Time: 03/07/20 12:07 PM  Result Value Ref Range   WBC 10.0 3.4 - 10.8 x10E3/uL   RBC 4.52 3.77 - 5.28  x10E6/uL   Hemoglobin 13.6 11.1 - 15.9 g/dL   Hematocrit 41.0 34.0 - 46.6 %   MCV 91 79 - 97 fL   MCH 30.1 26.6 - 33.0 pg   MCHC 33.2 31.5 - 35.7 g/dL   RDW 11.9 11.7 - 15.4 %   Platelets 361 150 - 450 x10E3/uL   Neutrophils 68 Not Estab. %   Lymphs 23 Not Estab. %   Monocytes 5 Not Estab. %   Eos 3 Not Estab. %   Basos 0 Not Estab. %   Neutrophils Absolute 6.8 1.4 - 7.0 x10E3/uL   Lymphocytes Absolute 2.3 0.7 - 3.1 x10E3/uL   Monocytes Absolute 0.5 0.1 - 0.9 x10E3/uL   EOS (ABSOLUTE) 0.3 0.0 - 0.4 x10E3/uL   Basophils Absolute 0.0 0.0 - 0.2 x10E3/uL   Immature Granulocytes 1 Not Estab. %   Immature Grans (  Abs) 0.1 0.0 - 0.1 x10E3/uL  Comprehensive Metabolic Panel (CMET)     Status: Abnormal   Collection Time: 03/07/20 12:07 PM  Result Value Ref Range   Glucose 102 (H) 65 - 99 mg/dL   BUN 13 6 - 24 mg/dL   Creatinine, Ser 0.75 0.57 - 1.00 mg/dL   GFR calc non Af Amer 97 >59 mL/min/1.73   GFR calc Af Amer 112 >59 mL/min/1.73    Comment: **In accordance with recommendations from the NKF-ASN Task force,**   [facility] is in the process of updating its eGFR calculation to the   2021 CKD-EPI creatinine equation that estimates kidney function   without a race variable.    BUN/Creatinine Ratio 17 9 - 23   Sodium 142 134 - 144 mmol/L   Potassium 4.7 3.5 - 5.2 mmol/L   Chloride 105 96 - 106 mmol/L   CO2 23 20 - 29 mmol/L   Calcium 9.5 8.7 - 10.2 mg/dL   Total Protein 7.0 6.0 - 8.5 g/dL   Albumin 4.3 3.8 - 4.8 g/dL   Globulin, Total 2.7 1.5 - 4.5 g/dL   Albumin/Globulin Ratio 1.6 1.2 - 2.2   Bilirubin Total 0.3 0.0 - 1.2 mg/dL   Alkaline Phosphatase 68 44 - 121 IU/L   AST 12 0 - 40 IU/L   ALT 10 0 - 32 IU/L  Lipid Panel With LDL/HDL Ratio     Status: Abnormal   Collection Time: 03/07/20 12:07 PM  Result Value Ref Range   Cholesterol, Total 191 100 - 199 mg/dL   Triglycerides 172 (H) 0 - 149 mg/dL   HDL 62 >39 mg/dL   VLDL Cholesterol Cal 29 5 - 40 mg/dL   LDL Chol Calc (NIH)  100 (H) 0 - 99 mg/dL   LDL/HDL Ratio 1.6 0.0 - 3.2 ratio    Comment:                                     LDL/HDL Ratio                                             Men  Women                               1/2 Avg.Risk  1.0    1.5                                   Avg.Risk  3.6    3.2                                2X Avg.Risk  6.2    5.0                                3X Avg.Risk  8.0    6.1   TSH + free T4     Status: None   Collection Time: 03/07/20 12:07 PM  Result Value Ref Range   TSH 0.934 0.450 - 4.500 uIU/mL   Free T4 1.20 0.82 - 1.77  ng/dL  B12     Status: None   Collection Time: 03/07/20 12:07 PM  Result Value Ref Range   Vitamin B-12 618 232 - 1,245 pg/mL  Vitamin D (25 hydroxy)     Status: None   Collection Time: 03/07/20 12:07 PM  Result Value Ref Range   Vit D, 25-Hydroxy 49.3 30.0 - 100.0 ng/mL    Comment: Vitamin D deficiency has been defined by the [place] practice guideline as a level of serum 25-OH vitamin D less than 20 ng/mL (1,2). The Endocrine Society went on to further define vitamin D insufficiency as a level between 21 and 29 ng/mL (2). 1. IOM (Institute of Medicine). 2010. Dietary reference    intakes for calcium and D. [place]: The    [employer]. 2. Holick MF, Binkley [place], Bischoff-Ferrari HA, et al.    Evaluation, treatment, and prevention of vitamin D    deficiency: an Endocrine Society clinical practice    guideline. JCEM. 2011 Jul; 96(7):1911-30.   UA/M w/rflx Culture, Routine     Status: Abnormal (Preliminary result)   Collection Time: 03/10/20  4:28 PM   Specimen: Urine   Urine  Result Value Ref Range   Specific Gravity, UA 1.023 1.005 - 1.030   pH, UA 6.0 5.0 - 7.5   Color, UA Yellow Yellow   Appearance Ur Cloudy (A) Clear   Leukocytes,UA 2+ (A) Negative   Protein,UA Negative Negative/Trace   Glucose, UA Negative Negative   Ketones, UA Negative Negative   RBC, UA Negative  Negative   Bilirubin, UA Negative Negative   Urobilinogen, Ur 0.2 0.2 - 1.0 mg/dL   Nitrite, UA Negative Negative   Microscopic Examination See below:     Comment: Microscopic was indicated and was performed.   Urinalysis Reflex Comment     Comment: This specimen has reflexed to a Urine Culture.  Microscopic Examination     Status: Abnormal   Collection Time: 03/10/20  4:28 PM   Urine  Result Value Ref Range   WBC, UA 0-5 0 - 5 /hpf   RBC 0-2 0 - 2 /hpf   Epithelial Cells (non renal) 0-10 0 - 10 /hpf   Casts None seen None seen /lpf   Bacteria, UA Few (A) None seen/Few  Urine Culture, Reflex     Status: None (Preliminary result)   Collection Time: 03/10/20  4:28 PM   Urine  Result Value Ref Range   Urine Culture, Routine WILL FOLLOW     Assessment/Plan: 1. Encounter for routine adult health examination with abnormal findings Well appearing 45 year old female Reviewed labs--discussed diet options to improve slightly elevated triglyceride level Up to date on PHM - Microscopic Examination - Urine Culture, Reflex  2. Episodic cluster headache, not intractable Will obtain MRI long standing intermittent headaches as well as other neurological symptoms - MR Brain Wo Contrast; Future  3. History of Bell's palsy Ringing in left ear as well as visual disturbance to left eye recurring symptom she experienced when diagnosed with Bell's Palsy in 2019  4. Encounter for long-term (current) use of medications - UA/M w/rflx Culture, Routine  General Counseling: Yocelyn verbalizes understanding of the findings of todays visit and agrees with plan of treatment. I have discussed any further diagnostic evaluation that may be needed or ordered today. We also reviewed her medications today. she has been encouraged to call the office with any questions or concerns that should arise related to  todays visit.    Counseling:    Orders Placed This Encounter  Procedures  . Microscopic Examination   . Urine Culture, Reflex  . MR Brain Wo Contrast  . UA/M w/rflx Culture, Routine   Total time spent: 30 Minutes  Time spent includes review of chart, medications, test results, and follow up plan with the patient.   This patient was seen by Theodoro Grist AGNP-C Collaboration with Dr Lavera Guise as a part of collaborative care agreement   Tanna Furry. [facility] Internal Medicine

## 2020-03-12 ENCOUNTER — Encounter: Payer: Self-pay | Admitting: Hospice and Palliative Medicine

## 2020-03-13 ENCOUNTER — Other Ambulatory Visit: Payer: Self-pay | Admitting: Internal Medicine

## 2020-03-13 DIAGNOSIS — G44019 Episodic cluster headache, not intractable: Secondary | ICD-10-CM

## 2020-03-14 DIAGNOSIS — Z124 Encounter for screening for malignant neoplasm of cervix: Secondary | ICD-10-CM | POA: Diagnosis not present

## 2020-03-14 DIAGNOSIS — Z01419 Encounter for gynecological examination (general) (routine) without abnormal findings: Secondary | ICD-10-CM | POA: Diagnosis not present

## 2020-03-14 DIAGNOSIS — Z6830 Body mass index (BMI) 30.0-30.9, adult: Secondary | ICD-10-CM | POA: Diagnosis not present

## 2020-03-16 ENCOUNTER — Other Ambulatory Visit: Payer: Self-pay | Admitting: Hospice and Palliative Medicine

## 2020-03-16 LAB — UA/M W/RFLX CULTURE, ROUTINE
Bilirubin, UA: NEGATIVE
Glucose, UA: NEGATIVE
Ketones, UA: NEGATIVE
Nitrite, UA: NEGATIVE
Protein,UA: NEGATIVE
RBC, UA: NEGATIVE
Specific Gravity, UA: 1.023 (ref 1.005–1.030)
Urobilinogen, Ur: 0.2 mg/dL (ref 0.2–1.0)
pH, UA: 6 (ref 5.0–7.5)

## 2020-03-16 LAB — MICROSCOPIC EXAMINATION: Casts: NONE SEEN /lpf

## 2020-03-16 LAB — URINE CULTURE, REFLEX

## 2020-03-16 MED ORDER — NITROFURANTOIN MONOHYD MACRO 100 MG PO CAPS: 100.0000 mg | ORAL_CAPSULE | Freq: Two times a day (BID) | ORAL | 0 refills | Status: DC

## 2020-03-16 NOTE — Progress Notes (Signed)
Please call and let patient know that she has a UTI--results from urine culture. I have sent in an antibiotic to her pharmacy for her to take.

## 2020-03-17 ENCOUNTER — Telehealth: Payer: Self-pay

## 2020-03-17 NOTE — Telephone Encounter (Signed)
PT notified

## 2020-03-17 NOTE — Telephone Encounter (Signed)
-----   Message from Luiz Ochoa, NP sent at 03/16/2020  5:52 PM EST ----- Please call and let patient know that she has a UTI--results from urine culture. I have sent in an antibiotic to her pharmacy for her to take.

## 2020-03-20 ENCOUNTER — Ambulatory Visit
Admission: RE | Admit: 2020-03-20 | Discharge: 2020-03-20 | Disposition: A | Discharge status: Home / Self Care | Payer: BC Managed Care – PPO | Source: Ambulatory Visit | Attending: Hospice and Palliative Medicine | Admitting: Hospice and Palliative Medicine

## 2020-03-20 DIAGNOSIS — G44019 Episodic cluster headache, not intractable: Secondary | ICD-10-CM | POA: Insufficient documentation

## 2020-03-20 DIAGNOSIS — H539 Unspecified visual disturbance: Secondary | ICD-10-CM | POA: Diagnosis not present

## 2020-03-20 DIAGNOSIS — H9312 Tinnitus, left ear: Secondary | ICD-10-CM | POA: Diagnosis not present

## 2020-03-23 ENCOUNTER — Telehealth: Payer: Self-pay

## 2020-03-23 NOTE — Telephone Encounter (Signed)
-----   Message from Luiz Ochoa, NP sent at 03/23/2020  4:02 PM EST ----- Please advised pt  MRI is normal

## 2020-03-23 NOTE — Telephone Encounter (Signed)
Pt notified mri normal

## 2020-05-16 ENCOUNTER — Other Ambulatory Visit: Payer: Self-pay | Admitting: Hospice and Palliative Medicine

## 2020-05-16 ENCOUNTER — Telehealth: Payer: Self-pay

## 2020-05-16 DIAGNOSIS — F5101 Primary insomnia: Secondary | ICD-10-CM

## 2020-05-16 MED ORDER — ZOLPIDEM TARTRATE 10 MG PO TABS: ORAL_TABLET | ORAL | 0 refills | Status: DC

## 2020-05-16 NOTE — Telephone Encounter (Signed)
Pt called that ambien refill due to she loss her luggage pt advised that taylor send med refills for you

## 2020-05-25 ENCOUNTER — Encounter: Payer: Self-pay | Admitting: Hospice and Palliative Medicine

## 2020-05-25 ENCOUNTER — Ambulatory Visit: Payer: BC Managed Care – PPO | Admitting: Hospice and Palliative Medicine

## 2020-05-25 VITALS — BP 108/68 | HR 95 | Temp 97.3°F | Resp 16 | Ht 70.0 in | Wt 208.8 lb

## 2020-05-25 DIAGNOSIS — J0140 Acute pansinusitis, unspecified: Secondary | ICD-10-CM | POA: Diagnosis not present

## 2020-05-25 DIAGNOSIS — J301 Allergic rhinitis due to pollen: Secondary | ICD-10-CM | POA: Diagnosis not present

## 2020-05-25 MED ORDER — CETIRIZINE HCL 10 MG PO TABS: 10.0000 mg | ORAL_TABLET | Freq: Every day | ORAL | 1 refills | Status: DC

## 2020-05-25 MED ORDER — DOXYCYCLINE HYCLATE 100 MG PO TABS: 100.0000 mg | ORAL_TABLET | Freq: Two times a day (BID) | ORAL | 0 refills | Status: DC

## 2020-05-25 MED ORDER — PREDNISONE 10 MG PO TABS: ORAL_TABLET | ORAL | 0 refills | Status: DC

## 2020-05-25 NOTE — Progress Notes (Signed)
[facility] [place], [place] [address]  Internal MEDICINE  Office Visit Note  Patient Name: Deanna Sims  [id number]  [id number]  Date of Service: 05/30/2020  Chief Complaint  Patient presents with  . Acute Visit  . Sinusitis    Started Sunday, face swollen, pressure in face, ear and throat pain, no energy, neg. Covid test, no fever  . Quality Metric Gaps    Pap scheduled      HPI Pt is here for a sick visit. Complaining of sinus pain and pressure, nasal congestion, ear fullness, sore throat and headaches Symptoms initially started over the weekend--has been taking OTC cold and sinus medications that have not relived her symptoms Has taken COVID tests with negative results Denies fevers, shortness of breath or difficulty breathing  Her kids had similar symptoms last week but were treated and are now feeling better   Current Medication:  Outpatient Encounter Medications as of 05/25/2020  Medication Sig  . amphetamine-dextroamphetamine (ADDERALL) 5 MG tablet Take one to 2 tabs as needed  . cetirizine (ZYRTEC) 10 MG tablet Take 1 tablet (10 mg total) by mouth daily.  Marland Kitchen doxycycline (VIBRA-TABS) 100 MG tablet Take 1 tablet (100 mg total) by mouth 2 (two) times daily.  . fluticasone (FLONASE) 50 MCG/ACT nasal spray Place 1 spray into both nostrils daily as needed for allergies or rhinitis.  Marland Kitchen ibuprofen (ADVIL) 600 MG tablet Take 1 tablet (600 mg total) by mouth every 6 (six) hours.  . predniSONE (DELTASONE) 10 MG tablet Take 1 tablet three times a day with a meal for three for three days, take 1 tablet by twice daily with a meal for 3 days, take 1 tablet once daily with a meal for 3 days  . SPRINTEC 28 0.25-35 MG-MCG tablet Take 1 tablet by mouth daily.  Marland Kitchen zolpidem (AMBIEN) 10 MG tablet Take one tab po qhs as needed, may take one extra if needed  . [DISCONTINUED] albuterol (VENTOLIN HFA) 108 (90 Base) MCG/ACT inhaler Inhale 2 puffs into the lungs every  6 (six) hours as needed for wheezing or shortness of breath. (Patient not taking: Reported on 05/25/2020)  . [DISCONTINUED] nitrofurantoin, macrocrystal-monohydrate, (MACROBID) 100 MG capsule Take 1 capsule (100 mg total) by mouth 2 (two) times daily.   No facility-administered encounter medications on file as of 05/25/2020.      Medical History: Past Medical History:  Diagnosis Date  . Bell palsy   . Insomnia   . Postpartum care following repeat cesarean delivery (6/29) 08/12/2015  . Seasonal allergies   . Sinusitis      Vital Signs: BP 108/68   Pulse 95   Temp (!) 97.3 F (36.3 C)   Resp 16   Ht 5\' 10"  (1.778 m)   Wt 208 lb 12.8 oz (94.7 kg)   SpO2 99%   BMI 29.96 kg/m    Review of Systems  Constitutional: Negative for chills, diaphoresis and fatigue.  HENT: Positive for congestion, ear pain, sinus pressure, sinus pain and sore throat. Negative for postnasal drip.   Eyes: Negative for photophobia, discharge, redness, itching and visual disturbance.  Respiratory: Negative for cough, shortness of breath and wheezing.   Cardiovascular: Negative for chest pain, palpitations and leg swelling.  Gastrointestinal: Negative for abdominal pain, constipation, diarrhea, nausea and vomiting.  Genitourinary: Negative for dysuria and flank pain.  Musculoskeletal: Negative for arthralgias, back pain, gait problem and neck pain.  Skin: Negative for color change.  Allergic/Immunologic: Negative for environmental allergies  and food allergies.  Neurological: Negative for dizziness and headaches.  Hematological: Does not bruise/bleed easily.  Psychiatric/Behavioral: Negative for agitation, behavioral problems (depression) and hallucinations.    Physical Exam Vitals reviewed.  Constitutional:      Appearance: She is normal weight. She is ill-appearing.  HENT:     Nose: Congestion present.  Cardiovascular:     Rate and Rhythm: Normal rate and regular rhythm.     Pulses: Normal pulses.      Heart sounds: Normal heart sounds.  Pulmonary:     Effort: Pulmonary effort is normal.     Breath sounds: Normal breath sounds.  Abdominal:     General: Abdomen is flat.     Palpations: Abdomen is soft.  Musculoskeletal:        General: Normal range of motion.     Cervical back: Normal range of motion.  Skin:    General: Skin is warm.  Neurological:     General: No focal deficit present.     Mental Status: She is alert and oriented to person, place, and time. Mental status is at baseline.  Psychiatric:        Mood and Affect: Mood normal.        Behavior: Behavior normal.        Thought Content: Thought content normal.        Judgment: Judgment normal.    Assessment/Plan: 1. Acute non-recurrent pansinusitis Complete course of doxycycline as well as prednisone taper Advised to contact office if symptoms worsen or if symptoms persist after completing antibiotic therapy - doxycycline (VIBRA-TABS) 100 MG tablet; Take 1 tablet (100 mg total) by mouth 2 (two) times daily.  Dispense: 20 tablet; Refill: 0 - predniSONE (DELTASONE) 10 MG tablet; Take 1 tablet three times a day with a meal for three for three days, take 1 tablet by twice daily with a meal for 3 days, take 1 tablet once daily with a meal for 3 days  Dispense: 18 tablet; Refill: 0  2. Allergic rhinitis due to pollen, unspecified seasonality Requesting refills, consider further work-up if recurrent sinus symptoms persist - cetirizine (ZYRTEC) 10 MG tablet; Take 1 tablet (10 mg total) by mouth daily.  Dispense: 90 tablet; Refill: 1  General Counseling: Deanna Sims verbalizes understanding of the findings of todays visit and agrees with plan of treatment. I have discussed any further diagnostic evaluation that may be needed or ordered today. We also reviewed her medications today. she has been encouraged to call the office with any questions or concerns that should arise related to todays visit.   Meds ordered this encounter   Medications  . doxycycline (VIBRA-TABS) 100 MG tablet    Sig: Take 1 tablet (100 mg total) by mouth 2 (two) times daily.    Dispense:  20 tablet    Refill:  0  . predniSONE (DELTASONE) 10 MG tablet    Sig: Take 1 tablet three times a day with a meal for three for three days, take 1 tablet by twice daily with a meal for 3 days, take 1 tablet once daily with a meal for 3 days    Dispense:  18 tablet    Refill:  0  . cetirizine (ZYRTEC) 10 MG tablet    Sig: Take 1 tablet (10 mg total) by mouth daily.    Dispense:  90 tablet    Refill:  1    Time spent: 25 Minutes  This patient was seen by Theodoro Grist AGNP-C in Collaboration with  Dr Lavera Guise as a part of collaborative care agreement.  Tanna Furry [facility] Internal Medicine

## 2020-05-30 ENCOUNTER — Encounter: Payer: Self-pay | Admitting: Hospice and Palliative Medicine

## 2020-07-06 ENCOUNTER — Ambulatory Visit: Payer: BC Managed Care – PPO | Admitting: Nurse Practitioner

## 2020-07-07 ENCOUNTER — Encounter: Payer: Self-pay | Admitting: Nurse Practitioner

## 2020-07-07 ENCOUNTER — Other Ambulatory Visit: Payer: Self-pay

## 2020-07-07 ENCOUNTER — Ambulatory Visit: Payer: BC Managed Care – PPO | Admitting: Nurse Practitioner

## 2020-07-07 DIAGNOSIS — R41840 Attention and concentration deficit: Secondary | ICD-10-CM | POA: Diagnosis not present

## 2020-07-07 DIAGNOSIS — F5101 Primary insomnia: Secondary | ICD-10-CM | POA: Diagnosis not present

## 2020-07-07 MED ORDER — ZOLPIDEM TARTRATE 10 MG PO TABS: ORAL_TABLET | ORAL | 2 refills | Status: DC

## 2020-07-07 MED ORDER — AMPHETAMINE-DEXTROAMPHETAMINE 5 MG PO TABS: ORAL_TABLET | ORAL | 0 refills | Status: DC

## 2020-07-07 NOTE — Progress Notes (Signed)
[facility] [place], [place] [address]  Internal MEDICINE  Office Visit Note  Patient Name: Deanna Sims  [id number]  [id number]  Date of Service: 07/09/2020  Chief Complaint  Patient presents with  . Follow-up    Just needs refills.    HPI Deanna Sims presents for follow up visit for refills of ambien and adderall. She has a history of insomnia and ADHD. She denies any pain or other concerns or questions today.   Current Medication: Outpatient Encounter Medications as of 07/07/2020  Medication Sig  . [START ON 08/15/2020] amphetamine-dextroamphetamine (ADDERALL) 5 MG tablet Take one to two tablets daily as needed for ADHD.  Derrill Memo ON 09/14/2020] amphetamine-dextroamphetamine (ADDERALL) 5 MG tablet Take one to two tablets daily as needed for ADHD  . cetirizine (ZYRTEC) 10 MG tablet Take 1 tablet (10 mg total) by mouth daily.  . fluticasone (FLONASE) 50 MCG/ACT nasal spray Place 1 spray into both nostrils daily as needed for allergies or rhinitis.  Marland Kitchen ibuprofen (ADVIL) 600 MG tablet Take 1 tablet (600 mg total) by mouth every 6 (six) hours.  . SPRINTEC 28 0.25-35 MG-MCG tablet Take 1 tablet by mouth daily.  . [DISCONTINUED] amphetamine-dextroamphetamine (ADDERALL) 5 MG tablet Take one to 2 tabs as needed  . [DISCONTINUED] doxycycline (VIBRA-TABS) 100 MG tablet Take 1 tablet (100 mg total) by mouth 2 (two) times daily.  . [DISCONTINUED] predniSONE (DELTASONE) 10 MG tablet Take 1 tablet three times a day with a meal for three for three days, take 1 tablet by twice daily with a meal for 3 days, take 1 tablet once daily with a meal for 3 days  . [DISCONTINUED] zolpidem (AMBIEN) 10 MG tablet Take one tab po qhs as needed, may take one extra if needed  . amphetamine-dextroamphetamine (ADDERALL) 5 MG tablet Take one to 2 tabs as needed  . zolpidem (AMBIEN) 10 MG tablet Take one tab po qhs as needed, may take one extra if needed   No facility-administered encounter  medications on file as of 07/07/2020.    Surgical History: Past Surgical History:  Procedure Laterality Date  . CESAREAN SECTION N/A 08/11/2015   Procedure: Primary CESAREAN SECTION;  Surgeon: Brien Few, MD;  Location: [place];  Service: Obstetrics;  Laterality: N/A;  EDD: 08/17/15 Allergy: PCN  . CESAREAN SECTION N/A 08/14/2017   Procedure: Repeat CESAREAN SECTION;  Surgeon: Brien Few, MD;  Location: [place];  Service: Obstetrics;  Laterality: N/A;  EDD: 08/20/17 Allergy: Penicillin  . DILATION AND EVACUATION N/A 03/15/2014   Procedure: DILATATION AND EVACUATION;  Surgeon: Lyman Speller, MD;  Location: [facility];  Service: Gynecology;  Laterality: N/A;  . LAPAROSCOPY N/A 03/15/2014   Procedure: LAPAROSCOPY OPERATIVE;  Surgeon: Lyman Speller, MD;  Location: [facility];  Service: Gynecology;  Laterality: N/A;  . WISDOM TOOTH EXTRACTION      Medical History: Past Medical History:  Diagnosis Date  . Bell palsy   . Insomnia   . Postpartum care following repeat cesarean delivery (6/29) 08/12/2015  . Seasonal allergies   . Sinusitis     Family History: Family History  Problem Relation Age of Onset  . Uterine cancer Mother 14  . Cancer - Ovarian Maternal Grandmother   . Cancer - Lung Maternal Grandmother   . Cancer - Lung Maternal Grandfather     Social History   Socioeconomic History  . Marital status: Married    Spouse name: Not on file  . Number of children:  Not on file  . Years of education: Not on file  . Highest education level: Not on file  Occupational History  . Not on file  Tobacco Use  . Smoking status: Former Smoker    Packs/day: 1.00    Years: 13.00    Pack years: 13.00    Types: Cigarettes    Quit date: 08/12/2013    Years since quitting: 6.9  . Smokeless tobacco: Never Used  Vaping Use  . Vaping Use: Never used  Substance and Sexual Activity  . Alcohol use: Yes    Alcohol/week: 5.0 standard drinks    Types: 5 Glasses of wine  per week    Comment: ocassionally  . Drug use: No  . Sexual activity: Yes    Partners: Male    Birth control/protection: None    Comment: approx 7-[redacted] wks gestation?? per patient  Other Topics Concern  . Not on file  Social History Narrative  . Not on file   Social Determinants of Health   Financial Resource Strain: Not on file  Food Insecurity: Not on file  Transportation Needs: Not on file  Physical Activity: Not on file  Stress: Not on file  Social Connections: Not on file  Intimate Partner Violence: Not on file      Review of Systems  Constitutional: Negative for chills, fatigue and unexpected weight change.  HENT: Negative for congestion, rhinorrhea, sneezing and sore throat.   Eyes: Negative for redness.  Respiratory: Negative for cough, chest tightness and shortness of breath.   Cardiovascular: Negative for chest pain and palpitations.  Gastrointestinal: Negative for abdominal pain, constipation, diarrhea, nausea and vomiting.  Genitourinary: Negative for dysuria and frequency.  Musculoskeletal: Negative for arthralgias, back pain, joint swelling and neck pain.  Skin: Negative for rash.  Neurological: Negative.  Negative for tremors and numbness.  Hematological: Negative for adenopathy. Does not bruise/bleed easily.  Psychiatric/Behavioral: Negative for behavioral problems (Depression), sleep disturbance and suicidal ideas. The patient is not nervous/anxious.     Vital Signs: BP 131/89   Pulse 78   Temp 98.4 F (36.9 C)   Resp 16   Ht 5\' 10"  (1.778 m)   Wt 208 lb 6.4 oz (94.5 kg)   SpO2 98%   BMI 29.90 kg/m    Physical Exam Vitals reviewed.  Constitutional:      General: She is not in acute distress.    Appearance: Normal appearance. She is well-developed, well-groomed and overweight. She is not ill-appearing.  HENT:     Head: Normocephalic and atraumatic.  Cardiovascular:     Rate and Rhythm: Normal rate and regular rhythm.     Pulses: Normal  pulses.     Heart sounds: Normal heart sounds.  Pulmonary:     Effort: Pulmonary effort is normal.     Breath sounds: Normal breath sounds.  Skin:    General: Skin is warm and dry.     Capillary Refill: Capillary refill takes less than 2 seconds.  Neurological:     Mental Status: She is alert and oriented to person, place, and time.  Psychiatric:        Mood and Affect: Mood normal.        Behavior: Behavior normal. Behavior is cooperative.    Assessment/Plan: 1. Attention and concentration deficit Patient has a history of ADHD. She has been taking adderall 5-10 mg daily as needed for associated symptoms. She denies any adverse side effects of the medication and reports that the dose is working  well for her. Refills ordered. Will follow up in 3 months for med check and refills.  - amphetamine-dextroamphetamine (ADDERALL) 5 MG tablet; Take one to 2 tabs as needed  Dispense: 60 tablet; Refill: 0 - amphetamine-dextroamphetamine (ADDERALL) 5 MG tablet; Take one to two tablets daily as needed for ADHD.  Dispense: 60 tablet; Refill: 0 - amphetamine-dextroamphetamine (ADDERALL) 5 MG tablet; Take one to two tablets daily as needed for ADHD  Dispense: 60 tablet; Refill: 0  2. Primary insomnia Taking ambien for insomnia. Dose is working well. Refill ordered. Follow up in 3 months.  - zolpidem (AMBIEN) 10 MG tablet; Take one tab po qhs as needed, may take one extra if needed  Dispense: 30 tablet; Refill: 2   General Counseling: Margaret verbalizes understanding of the findings of todays visit and agrees with plan of treatment. I have discussed any further diagnostic evaluation that may be needed or ordered today. We also reviewed her medications today. she has been encouraged to call the office with any questions or concerns that should arise related to todays visit.    No orders of the defined types were placed in this encounter.   Meds ordered this encounter  Medications  . zolpidem (AMBIEN)  10 MG tablet    Sig: Take one tab po qhs as needed, may take one extra if needed    Dispense:  30 tablet    Refill:  2  . amphetamine-dextroamphetamine (ADDERALL) 5 MG tablet    Sig: Take one to 2 tabs as needed    Dispense:  60 tablet    Refill:  0    Do not fill before 07/18/20  . amphetamine-dextroamphetamine (ADDERALL) 5 MG tablet    Sig: Take one to two tablets daily as needed for ADHD.    Dispense:  60 tablet    Refill:  0    Please do not fill until 08/15/20.    Order Specific Question:   Supervising Provider    Answer:   Lavera Guise [[id number]]  . amphetamine-dextroamphetamine (ADDERALL) 5 MG tablet    Sig: Take one to two tablets daily as needed for ADHD    Dispense:  60 tablet    Refill:  0    Please do not fill until 09/14/20    Order Specific Question:   Supervising Provider    Answer:   Lavera Guise [place]   Return in about 3 months (around 10/07/2020) for F/U, ADHD med check, med refill, [place] PCP.  Total time spent:30 Minutes Time spent includes review of chart, medications, test results, and follow up plan with the patient.   [place] Controlled Substance Database was reviewed by me.  This patient was seen by Jonetta Osgood, FNP-C in collaboration with Dr. Clayborn Bigness as a part of collaborative care agreement.  Jonetta Osgood, MSN, FNP-C Internal medicine

## 2020-07-09 MED ORDER — AMPHETAMINE-DEXTROAMPHETAMINE 5 MG PO TABS
ORAL_TABLET | ORAL | 0 refills | Status: DC
Start: 2020-08-15 — End: 2020-09-26

## 2020-07-09 MED ORDER — AMPHETAMINE-DEXTROAMPHETAMINE 5 MG PO TABS: ORAL_TABLET | ORAL | 0 refills | Status: DC

## 2020-07-22 ENCOUNTER — Ambulatory Visit: Payer: BC Managed Care – PPO | Admitting: Nurse Practitioner

## 2020-07-22 ENCOUNTER — Encounter: Payer: Self-pay | Admitting: Nurse Practitioner

## 2020-07-22 VITALS — Resp 16 | Ht 70.0 in | Wt 190.0 lb

## 2020-07-22 DIAGNOSIS — J301 Allergic rhinitis due to pollen: Secondary | ICD-10-CM

## 2020-07-22 DIAGNOSIS — J0191 Acute recurrent sinusitis, unspecified: Secondary | ICD-10-CM

## 2020-07-22 MED ORDER — DOXYCYCLINE HYCLATE 100 MG PO TABS: 100.0000 mg | ORAL_TABLET | Freq: Two times a day (BID) | ORAL | 0 refills | Status: DC

## 2020-07-22 NOTE — Progress Notes (Signed)
[facility] [place], [place] [address]  Internal MEDICINE  Telephone Visit  Patient Name: Deanna Sims  [id number]  [id number]  Date of Service: 07/29/2020  I connected with the patient at 10:05 AM by telephone and verified the patients identity using two identifiers.   I discussed the limitations, risks, security and privacy concerns of performing an evaluation and management service by telephone and the availability of in person appointments. I also discussed with the patient that there may be a patient responsible charge related to the service.  The patient expressed understanding and agrees to proceed.    Chief Complaint  Patient presents with   Nasal Congestion    Started 2-3 days ago   Telephone Assessment    [phone] virtual   Telephone Screen   Headache    Not been tested for covid, Not had any covid vaccines   eye mucus    Swollen shut right eye    HPI Deanna Sims presents via virtual video for an acute sick visit. She did a home COVID test that was negative. She reports having nasal congestion that started 2 to 3 days ago with post nasal drip and runny nose. She reports having some fatigue but denies fever, chills, cough, shortness of breath, ear pain, sinus pain/pressure. She also reports a sore throat. She stated that her right eye was swollen shut this morning.     Current Medication: Outpatient Encounter Medications as of 07/22/2020  Medication Sig   amphetamine-dextroamphetamine (ADDERALL) 5 MG tablet Take one to 2 tabs as needed   [START ON 08/15/2020] amphetamine-dextroamphetamine (ADDERALL) 5 MG tablet Take one to two tablets daily as needed for ADHD.   [START ON 09/14/2020] amphetamine-dextroamphetamine (ADDERALL) 5 MG tablet Take one to two tablets daily as needed for ADHD   cetirizine (ZYRTEC) 10 MG tablet Take 1 tablet (10 mg total) by mouth daily.   doxycycline (VIBRA-TABS) 100 MG tablet Take 1 tablet (100 mg total) by mouth 2 (two)  times daily. With food   fluticasone (FLONASE) 50 MCG/ACT nasal spray Place 1 spray into both nostrils daily as needed for allergies or rhinitis.   ibuprofen (ADVIL) 600 MG tablet Take 1 tablet (600 mg total) by mouth every 6 (six) hours.   SPRINTEC 28 0.25-35 MG-MCG tablet Take 1 tablet by mouth daily.   zolpidem (AMBIEN) 10 MG tablet Take one tab po qhs as needed, may take one extra if needed   No facility-administered encounter medications on file as of 07/22/2020.    Surgical History: Past Surgical History:  Procedure Laterality Date   CESAREAN SECTION N/A 08/11/2015   Procedure: Primary CESAREAN SECTION;  Surgeon: Brien Few, MD;  Location: [place];  Service: Obstetrics;  Laterality: N/A;  EDD: 08/17/15 Allergy: PCN   CESAREAN SECTION N/A 08/14/2017   Procedure: Repeat CESAREAN SECTION;  Surgeon: Brien Few, MD;  Location: [place];  Service: Obstetrics;  Laterality: N/A;  EDD: 08/20/17 Allergy: Penicillin   DILATION AND EVACUATION N/A 03/15/2014   Procedure: DILATATION AND EVACUATION;  Surgeon: Lyman Speller, MD;  Location: [facility];  Service: Gynecology;  Laterality: N/A;   LAPAROSCOPY N/A 03/15/2014   Procedure: LAPAROSCOPY OPERATIVE;  Surgeon: Lyman Speller, MD;  Location: [facility];  Service: Gynecology;  Laterality: N/A;   WISDOM TOOTH EXTRACTION      Medical History: Past Medical History:  Diagnosis Date   Bell palsy    Insomnia    Postpartum care following repeat cesarean delivery (6/29) 08/12/2015  Seasonal allergies    Sinusitis     Family History: Family History  Problem Relation Age of Onset   Uterine cancer Mother 15   Cancer - Ovarian Maternal Grandmother    Cancer - Lung Maternal Grandmother    Cancer - Lung Maternal Grandfather     Social History   Socioeconomic History   Marital status: Married    Spouse name: Not on file   Number of children: Not on file   Years of education: Not on file   Highest education level: Not  on file  Occupational History   Not on file  Tobacco Use   Smoking status: Former    Packs/day: 1.00    Years: 13.00    Pack years: 13.00    Types: Cigarettes    Quit date: 08/12/2013    Years since quitting: 6.9   Smokeless tobacco: Never  Vaping Use   Vaping Use: Never used  Substance and Sexual Activity   Alcohol use: Yes    Alcohol/week: 5.0 standard drinks    Types: 5 Glasses of wine per week    Comment: ocassionally   Drug use: No   Sexual activity: Yes    Partners: Male    Birth control/protection: None    Comment: approx 7-[redacted] wks gestation?? per patient  Other Topics Concern   Not on file  Social History Narrative   Not on file   Social Determinants of Health   Financial Resource Strain: Not on file  Food Insecurity: Not on file  Transportation Needs: Not on file  Physical Activity: Not on file  Stress: Not on file  Social Connections: Not on file  Intimate Partner Violence: Not on file      Review of Systems  Constitutional:  Positive for fatigue. Negative for chills and fever.  HENT:  Positive for congestion, postnasal drip, rhinorrhea and sore throat. Negative for ear discharge, ear pain, hearing loss, sinus pressure, sinus pain, sneezing and trouble swallowing.   Eyes:  Positive for pain.       Right eye swollen shut  Respiratory:  Negative for cough, chest tightness, shortness of breath and wheezing.   Cardiovascular: Negative.  Negative for chest pain.  Gastrointestinal:  Negative for abdominal pain, constipation, diarrhea, nausea and vomiting.  Neurological:  Negative for dizziness, light-headedness and headaches.   Vital Signs: Resp 16   Ht 5\' 10"  (1.778 m)   Wt 190 lb (86.2 kg)   BMI 27.26 kg/m    Observation/Objective: Deanna Sims is alert and oriented during the video visit. She is appropriate, cooperative and engages in conversation. She sounds congested when speaking. She appears in no acute distress. On the video visit, no eye swelling,  redness, drainage can be seen.     Assessment/Plan: 1. Acute recurrent sinusitis, unspecified location June has had sinusitis at least twice this year so far. Doxycycline prescribed to empirically treat the sinusitis.  - doxycycline (VIBRA-TABS) 100 MG tablet; Take 1 tablet (100 mg total) by mouth 2 (two) times daily. With food  Dispense: 14 tablet; Refill: 0  2. Allergic rhinitis due to pollen, unspecified seasonality History of allergic rhinitis, eye symptoms may stem from this problem. Continue cetirizine as prescribed, encouraged patient to try symptomatic treatment with OTC eye drops that soothe allergy type symptoms, redness and itching.    General Counseling: Rheanne verbalizes understanding of the findings of today's phone visit and agrees with plan of treatment. I have discussed any further diagnostic evaluation that may be needed or  ordered today. We also reviewed her medications today. she has been encouraged to call the office with any questions or concerns that should arise related to todays visit.    No orders of the defined types were placed in this encounter.   Meds ordered this encounter  Medications   doxycycline (VIBRA-TABS) 100 MG tablet    Sig: Take 1 tablet (100 mg total) by mouth 2 (two) times daily. With food    Dispense:  14 tablet    Refill:  0    Time spent:20 Minutes  Return if symptoms worsen or fail to improve.   This patient was seen by Jonetta Osgood, FNP-C in collaboration with Dr. Clayborn Bigness as a part of collaborative care agreement.   Sadler Teschner R. Valetta Fuller, MSN, FNP-C Internal medicine

## 2020-08-29 ENCOUNTER — Telehealth: Payer: Self-pay

## 2020-08-29 NOTE — Telephone Encounter (Signed)
Please make an app for me tomorrow after noon

## 2020-08-29 NOTE — Telephone Encounter (Signed)
Patient called office this morning with flu like symptoms, she also state she feels like she has strep, a possible UTI. Patient took an at home Covid test and the results came back negative. I asked the patient when her symptoms started and she stated onset was yesterday. Please advise what to do.

## 2020-08-30 ENCOUNTER — Telehealth: Payer: BC Managed Care – PPO | Admitting: Internal Medicine

## 2020-09-01 ENCOUNTER — Telehealth: Payer: BC Managed Care – PPO | Admitting: Internal Medicine

## 2020-09-01 ENCOUNTER — Other Ambulatory Visit: Payer: Self-pay

## 2020-09-01 ENCOUNTER — Encounter: Payer: Self-pay | Admitting: Internal Medicine

## 2020-09-01 VITALS — Resp 16 | Ht 71.0 in | Wt 195.0 lb

## 2020-09-01 DIAGNOSIS — R059 Cough, unspecified: Secondary | ICD-10-CM | POA: Diagnosis not present

## 2020-09-01 DIAGNOSIS — J3089 Other allergic rhinitis: Secondary | ICD-10-CM | POA: Diagnosis not present

## 2020-09-01 MED ORDER — MONTELUKAST SODIUM 10 MG PO TABS: 10.0000 mg | ORAL_TABLET | Freq: Every day | ORAL | 3 refills | Status: DC

## 2020-09-01 MED ORDER — FEXOFENADINE-PSEUDOEPHED ER 60-120 MG PO TB12: ORAL_TABLET | ORAL | 1 refills | Status: DC

## 2020-09-01 NOTE — Progress Notes (Signed)
[facility] [place], [place] [address]  Internal MEDICINE  Telephone Visit  Patient Name: Deanna Sims  [id number]  [id number]  Date of Service: 09/10/2020  I connected with the patient at 1200pm by telephone and verified the patients identity using two identifiers.   I discussed the limitations, risks, security and privacy concerns of performing an evaluation and management service by telephone and the availability of in person appointments. I also discussed with the patient that there may be a patient responsible charge related to the service.  The patient expressed understanding and agrees to proceed.    Chief Complaint  Patient presents with   Telephone Assessment    Virtual [phone]    Telephone Screen   Sore Throat    Covid testing on Sunday and wed and both were negative.  No fever. No covid vaccines   Nasal Congestion   bodyaches    All symptoms started on Sunday.  Used multiple things OTC    Otitis Media   Headache    HPI  Pt is connected for acute and sick visit Body aches, nasal congestion and ear ache for few days patient did test for COVID twice and both have been negative she has not had her COVID vaccination no history of flu exposure patient has tried multiple medication over-the-counter with no relief in her symptoms her sore throat is worsening denies any fever or chills no nausea vomiting diarrhea   Current Medication: Outpatient Encounter Medications as of 09/01/2020  Medication Sig   amphetamine-dextroamphetamine (ADDERALL) 5 MG tablet Take one to 2 tabs as needed   amphetamine-dextroamphetamine (ADDERALL) 5 MG tablet Take one to two tablets daily as needed for ADHD.   [START ON 09/14/2020] amphetamine-dextroamphetamine (ADDERALL) 5 MG tablet Take one to two tablets daily as needed for ADHD   cetirizine (ZYRTEC) 10 MG tablet Take 1 tablet (10 mg total) by mouth daily.   doxycycline (VIBRA-TABS) 100 MG tablet Take 1 tablet (100  mg total) by mouth 2 (two) times daily. With food   fexofenadine-pseudoephedrine (ALLEGRA-D ALLERGY & CONGESTION) 60-120 MG 12 hr tablet TAKE ONE TAB PO QAM FOR NASAL CONGESTION PRN   fluticasone (FLONASE) 50 MCG/ACT nasal spray Place 1 spray into both nostrils daily as needed for allergies or rhinitis.   ibuprofen (ADVIL) 600 MG tablet Take 1 tablet (600 mg total) by mouth every 6 (six) hours.   montelukast (SINGULAIR) 10 MG tablet Take 1 tablet (10 mg total) by mouth at bedtime.   SPRINTEC 28 0.25-35 MG-MCG tablet Take 1 tablet by mouth daily.   zolpidem (AMBIEN) 10 MG tablet Take one tab po qhs as needed, may take one extra if needed   No facility-administered encounter medications on file as of 09/01/2020.    Surgical History: Past Surgical History:  Procedure Laterality Date   CESAREAN SECTION N/A 08/11/2015   Procedure: Primary CESAREAN SECTION;  Surgeon: Brien Few, MD;  Location: [place];  Service: Obstetrics;  Laterality: N/A;  EDD: 08/17/15 Allergy: PCN   CESAREAN SECTION N/A 08/14/2017   Procedure: Repeat CESAREAN SECTION;  Surgeon: Brien Few, MD;  Location: [place];  Service: Obstetrics;  Laterality: N/A;  EDD: 08/20/17 Allergy: Penicillin   DILATION AND EVACUATION N/A 03/15/2014   Procedure: DILATATION AND EVACUATION;  Surgeon: Lyman Speller, MD;  Location: [facility];  Service: Gynecology;  Laterality: N/A;   LAPAROSCOPY N/A 03/15/2014   Procedure: LAPAROSCOPY OPERATIVE;  Surgeon: Lyman Speller, MD;  Location: [facility];  Service: Gynecology;  Laterality: N/A;   WISDOM TOOTH EXTRACTION      Medical History: Past Medical History:  Diagnosis Date   Bell palsy    Insomnia    Postpartum care following repeat cesarean delivery (6/29) 08/12/2015   Seasonal allergies    Sinusitis     Family History: Family History  Problem Relation Age of Onset   Uterine cancer Mother 70   Cancer - Ovarian Maternal Grandmother    Cancer - Lung Maternal  Grandmother    Cancer - Lung Maternal Grandfather     Social History   Socioeconomic History   Marital status: Married    Spouse name: Not on file   Number of children: Not on file   Years of education: Not on file   Highest education level: Not on file  Occupational History   Not on file  Tobacco Use   Smoking status: Former    Packs/day: 1.00    Years: 13.00    Pack years: 13.00    Types: Cigarettes    Quit date: 08/12/2013    Years since quitting: 7.0   Smokeless tobacco: Never  Vaping Use   Vaping Use: Never used  Substance and Sexual Activity   Alcohol use: Yes    Alcohol/week: 5.0 standard drinks    Types: 5 Glasses of wine per week    Comment: ocassionally   Drug use: No   Sexual activity: Yes    Partners: Male    Birth control/protection: None    Comment: approx 7-[redacted] wks gestation?? per patient  Other Topics Concern   Not on file  Social History Narrative   Not on file   Social Determinants of Health   Financial Resource Strain: Not on file  Food Insecurity: Not on file  Transportation Needs: Not on file  Physical Activity: Not on file  Stress: Not on file  Social Connections: Not on file  Intimate Partner Violence: Not on file      Review of Systems  Constitutional:  Positive for fatigue. Negative for fever.  HENT:  Positive for sore throat. Negative for congestion, mouth sores and postnasal drip.   Respiratory:  Positive for cough.   Cardiovascular: Negative.  Negative for chest pain.  Genitourinary:  Negative for flank pain.  Neurological:  Positive for headaches.  Psychiatric/Behavioral: Negative.     Vital Signs: Resp 16   Ht '5\' 11"'$  (1.803 m)   Wt 195 lb (88.5 kg)   BMI 27.20 kg/m    Observation/Objective: Patient looks comfortable in no acute distress however does have nasal congestion    Assessment/Plan: 1. Non-seasonal allergic rhinitis, unspecified trigger Uncontrolled nonseasonal allergic rhinitis, vasomotor rhinitis we will  add Singulair and Allegra-D over-the-counter for now monitor symptoms she might need allergy testing in future - montelukast (SINGULAIR) 10 MG tablet; Take 1 tablet (10 mg total) by mouth at bedtime.  Dispense: 30 tablet; Refill: 3 - fexofenadine-pseudoephedrine (ALLEGRA-D ALLERGY & CONGESTION) 60-120 MG 12 hr tablet; TAKE ONE TAB PO QAM FOR NASAL CONGESTION PRN  Dispense: 30 tablet; Refill: 1  2. Cough This can be due to postnasal drip we will continue to monitor and might need further testing including a chest x-ray and allergy testing  General Counseling: Calandra verbalizes understanding of the findings of today's phone visit and agrees with plan of treatment. I have discussed any further diagnostic evaluation that may be needed or ordered today. We also reviewed her medications today. she has been encouraged to call the office  with any questions or concerns that should arise related to todays visit.    No orders of the defined types were placed in this encounter.   Meds ordered this encounter  Medications   montelukast (SINGULAIR) 10 MG tablet    Sig: Take 1 tablet (10 mg total) by mouth at bedtime.    Dispense:  30 tablet    Refill:  3   fexofenadine-pseudoephedrine (ALLEGRA-D ALLERGY & CONGESTION) 60-120 MG 12 hr tablet    Sig: TAKE ONE TAB PO QAM FOR NASAL CONGESTION PRN    Dispense:  30 tablet    Refill:  1    Time spent:10 Minutes    Dr Lavera Guise Internal medicine

## 2020-09-26 ENCOUNTER — Ambulatory Visit (INDEPENDENT_AMBULATORY_CARE_PROVIDER_SITE_OTHER): Payer: BC Managed Care – PPO | Admitting: Nurse Practitioner

## 2020-09-26 ENCOUNTER — Encounter: Payer: Self-pay | Admitting: Nurse Practitioner

## 2020-09-26 ENCOUNTER — Other Ambulatory Visit: Payer: Self-pay

## 2020-09-26 VITALS — BP 130/76 | HR 94 | Temp 97.4°F | Resp 16 | Ht 71.0 in | Wt 215.6 lb

## 2020-09-26 DIAGNOSIS — F5101 Primary insomnia: Secondary | ICD-10-CM

## 2020-09-26 DIAGNOSIS — R41840 Attention and concentration deficit: Secondary | ICD-10-CM

## 2020-09-26 DIAGNOSIS — Z79899 Other long term (current) drug therapy: Secondary | ICD-10-CM

## 2020-09-26 LAB — POCT URINE DRUG SCREEN
Methylenedioxyamphetamine: NOT DETECTED
POC Amphetamine UR: POSITIVE — AB
POC BENZODIAZEPINES UR: NOT DETECTED
POC Barbiturate UR: NOT DETECTED
POC Cocaine UR: NOT DETECTED
POC Ecstasy UR: NOT DETECTED
POC Marijuana UR: NOT DETECTED
POC Methadone UR: NOT DETECTED
POC Methamphetamine UR: NOT DETECTED
POC Opiate Ur: NOT DETECTED
POC Oxycodone UR: NOT DETECTED
POC PHENCYCLIDINE UR: NOT DETECTED
POC TRICYCLICS UR: NOT DETECTED

## 2020-09-26 MED ORDER — AMPHETAMINE-DEXTROAMPHETAMINE 5 MG PO TABS: ORAL_TABLET | ORAL | 0 refills | Status: DC

## 2020-09-26 MED ORDER — ZOLPIDEM TARTRATE 10 MG PO TABS: ORAL_TABLET | ORAL | 2 refills | Status: DC

## 2020-09-26 NOTE — Progress Notes (Signed)
[facility] [place], [place] [address]  Internal MEDICINE  Office Visit Note  Patient Name: Deanna Sims  [id number]  [id number]  Date of Service: 09/26/2020  Chief Complaint  Patient presents with   Follow-up   ADHD   Quality Metric Gaps    Per pt tdap and pap done, will call with dates    HPI Deanna Sims presents for a follow up visit for ADHD and medication refills. She is due for UDS.She is due for her annual physical exam in December 2022. She goes to an OBGYN clinic for women's health. She has had tdap vaccine and her pap was done in the previous year. She only needs refills today. She denies any pain or any other questions or concerns. She will be due in late August for ambien refill.   Current Medication: Outpatient Encounter Medications as of 09/26/2020  Medication Sig   cetirizine (ZYRTEC) 10 MG tablet Take 1 tablet (10 mg total) by mouth daily.   fluticasone (FLONASE) 50 MCG/ACT nasal spray Place 1 spray into both nostrils daily as needed for allergies or rhinitis.   ibuprofen (ADVIL) 600 MG tablet Take 1 tablet (600 mg total) by mouth every 6 (six) hours.   SPRINTEC 28 0.25-35 MG-MCG tablet Take 1 tablet by mouth daily.   [DISCONTINUED] amphetamine-dextroamphetamine (ADDERALL) 5 MG tablet Take one to two tablets daily as needed for ADHD.   [DISCONTINUED] amphetamine-dextroamphetamine (ADDERALL) 5 MG tablet Take one to two tablets daily as needed for ADHD   [DISCONTINUED] zolpidem (AMBIEN) 10 MG tablet Take one tab po qhs as needed, may take one extra if needed   amphetamine-dextroamphetamine (ADDERALL) 5 MG tablet Take one to two tablets daily as needed for ADHD.   [START ON 10/24/2020] amphetamine-dextroamphetamine (ADDERALL) 5 MG tablet Take one to two tablets daily as needed for ADHD.   [START ON 11/21/2020] amphetamine-dextroamphetamine (ADDERALL) 5 MG tablet Take one to two tablets daily as needed for ADHD.   [START ON 10/06/2020] zolpidem  (AMBIEN) 10 MG tablet Take one tab po qhs as needed, may take one extra if needed   [DISCONTINUED] amphetamine-dextroamphetamine (ADDERALL) 5 MG tablet Take one to 2 tabs as needed (Patient not taking: Reported on 09/26/2020)   [DISCONTINUED] doxycycline (VIBRA-TABS) 100 MG tablet Take 1 tablet (100 mg total) by mouth 2 (two) times daily. With food (Patient not taking: Reported on 09/26/2020)   [DISCONTINUED] fexofenadine-pseudoephedrine (ALLEGRA-D ALLERGY & CONGESTION) 60-120 MG 12 hr tablet TAKE ONE TAB PO QAM FOR NASAL CONGESTION PRN (Patient not taking: Reported on 09/26/2020)   [DISCONTINUED] montelukast (SINGULAIR) 10 MG tablet Take 1 tablet (10 mg total) by mouth at bedtime. (Patient not taking: Reported on 09/26/2020)   No facility-administered encounter medications on file as of 09/26/2020.    Surgical History: Past Surgical History:  Procedure Laterality Date   CESAREAN SECTION N/A 08/11/2015   Procedure: Primary CESAREAN SECTION;  Surgeon: Brien Few, MD;  Location: [place];  Service: Obstetrics;  Laterality: N/A;  EDD: 08/17/15 Allergy: PCN   CESAREAN SECTION N/A 08/14/2017   Procedure: Repeat CESAREAN SECTION;  Surgeon: Brien Few, MD;  Location: [place];  Service: Obstetrics;  Laterality: N/A;  EDD: 08/20/17 Allergy: Penicillin   DILATION AND EVACUATION N/A 03/15/2014   Procedure: DILATATION AND EVACUATION;  Surgeon: Lyman Speller, MD;  Location: [facility];  Service: Gynecology;  Laterality: N/A;   LAPAROSCOPY N/A 03/15/2014   Procedure: LAPAROSCOPY OPERATIVE;  Surgeon: Lyman Speller, MD;  Location: [facility];  Service: Gynecology;  Laterality: N/A;   WISDOM TOOTH EXTRACTION      Medical History: Past Medical History:  Diagnosis Date   ADHD    Bell palsy    Insomnia    Postpartum care following repeat cesarean delivery (6/29) 08/12/2015   Seasonal allergies    Sinusitis     Family History: Family History  Problem Relation Age of Onset    Uterine cancer Mother 35   Cancer - Ovarian Maternal Grandmother    Cancer - Lung Maternal Grandmother    Cancer - Lung Maternal Grandfather     Social History   Socioeconomic History   Marital status: Married    Spouse name: Not on file   Number of children: Not on file   Years of education: Not on file   Highest education level: Not on file  Occupational History   Not on file  Tobacco Use   Smoking status: Former    Packs/day: 1.00    Years: 13.00    Pack years: 13.00    Types: Cigarettes    Quit date: 08/12/2013    Years since quitting: 7.1   Smokeless tobacco: Never  Vaping Use   Vaping Use: Never used  Substance and Sexual Activity   Alcohol use: Yes    Alcohol/week: 5.0 standard drinks    Types: 5 Glasses of wine per week    Comment: ocassionally   Drug use: No   Sexual activity: Yes    Partners: Male    Birth control/protection: None    Comment: approx 7-[redacted] wks gestation?? per patient  Other Topics Concern   Not on file  Social History Narrative   Not on file   Social Determinants of Health   Financial Resource Strain: Not on file  Food Insecurity: Not on file  Transportation Needs: Not on file  Physical Activity: Not on file  Stress: Not on file  Social Connections: Not on file  Intimate Partner Violence: Not on file      Review of Systems  Constitutional:  Negative for chills, fatigue and unexpected weight change.  HENT:  Negative for congestion, rhinorrhea, sneezing and sore throat.   Eyes:  Negative for redness.  Respiratory:  Negative for cough, chest tightness and shortness of breath.   Cardiovascular:  Negative for chest pain and palpitations.  Gastrointestinal:  Negative for abdominal pain, constipation, diarrhea, nausea and vomiting.  Genitourinary:  Negative for dysuria and frequency.  Musculoskeletal:  Negative for arthralgias, back pain, joint swelling and neck pain.  Skin:  Negative for rash.  Neurological: Negative.  Negative for  tremors and numbness.  Hematological:  Negative for adenopathy. Does not bruise/bleed easily.  Psychiatric/Behavioral:  Negative for behavioral problems (Depression), sleep disturbance and suicidal ideas. The patient is not nervous/anxious.    Vital Signs: BP 130/76   Pulse 94   Temp (!) 97.4 F (36.3 C)   Resp 16   Ht '5\' 11"'$  (1.803 m)   Wt 215 lb 9.6 oz (97.8 kg)   SpO2 98%   BMI 30.07 kg/m    Physical Exam Constitutional:      General: She is not in acute distress.    Appearance: Normal appearance. She is obese. She is not ill-appearing.  HENT:     Head: Normocephalic and atraumatic.  Cardiovascular:     Rate and Rhythm: Normal rate and regular rhythm.  Pulmonary:     Effort: Pulmonary effort is normal. No respiratory distress.  Skin:    General: Skin  is warm and dry.     Capillary Refill: Capillary refill takes less than 2 seconds.  Neurological:     Mental Status: She is alert and oriented to person, place, and time.  Psychiatric:        Mood and Affect: Mood normal.        Behavior: Behavior normal.       Assessment/Plan: 1. Encounter for long-term (current) use of medications UDS done, has prescription for adderall and ambien - POCT Urine Drug Screen  2. Attention and concentration deficit Medication and dose are effective per patient, continue as prescribed, 3 months of refills ordered. Follow up in 3 months for UDS and med refill - amphetamine-dextroamphetamine (ADDERALL) 5 MG tablet; Take one to two tablets daily as needed for ADHD.  Dispense: 60 tablet; Refill: 0 - amphetamine-dextroamphetamine (ADDERALL) 5 MG tablet; Take one to two tablets daily as needed for ADHD.  Dispense: 60 tablet; Refill: 0 - amphetamine-dextroamphetamine (ADDERALL) 5 MG tablet; Take one to two tablets daily as needed for ADHD.  Dispense: 60 tablet; Refill: 0  3. Primary insomnia Stable, refill ordered.  - zolpidem (AMBIEN) 10 MG tablet; Take one tab po qhs as needed, may take  one extra if needed  Dispense: 30 tablet; Refill: 2   General Counseling: Deanna Sims verbalizes understanding of the findings of todays visit and agrees with plan of treatment. I have discussed any further diagnostic evaluation that may be needed or ordered today. We also reviewed her medications today. she has been encouraged to call the office with any questions or concerns that should arise related to todays visit.    Orders Placed This Encounter  Procedures   POCT Urine Drug Screen    Meds ordered this encounter  Medications   amphetamine-dextroamphetamine (ADDERALL) 5 MG tablet    Sig: Take one to two tablets daily as needed for ADHD.    Dispense:  60 tablet    Refill:  0    Fill for august.   amphetamine-dextroamphetamine (ADDERALL) 5 MG tablet    Sig: Take one to two tablets daily as needed for ADHD.    Dispense:  60 tablet    Refill:  0    Fill for september   amphetamine-dextroamphetamine (ADDERALL) 5 MG tablet    Sig: Take one to two tablets daily as needed for ADHD.    Dispense:  60 tablet    Refill:  0    Fill for october   zolpidem (AMBIEN) 10 MG tablet    Sig: Take one tab po qhs as needed, may take one extra if needed    Dispense:  30 tablet    Refill:  2    Fill for mid to late august    Return in about 3 months (around 12/27/2020) for F/U, ADHD med check, Mikinzie Maciejewski PCP.   Total time spent:20 Minutes Time spent includes review of chart, medications, test results, and follow up plan with the patient.   [place] Controlled Substance Database was reviewed by me.  This patient was seen by Jonetta Osgood, FNP-C in collaboration with Dr. Clayborn Bigness as a part of collaborative care agreement.   Kadesha Virrueta R. Valetta Fuller, MSN, FNP-C Internal medicine

## 2020-10-28 ENCOUNTER — Other Ambulatory Visit: Payer: Self-pay | Admitting: Nurse Practitioner

## 2020-10-28 DIAGNOSIS — F5101 Primary insomnia: Secondary | ICD-10-CM

## 2020-10-28 NOTE — Telephone Encounter (Signed)
Already esend in 8 with 2 refills

## 2020-11-10 DIAGNOSIS — J0100 Acute maxillary sinusitis, unspecified: Secondary | ICD-10-CM | POA: Diagnosis not present

## 2020-11-15 DIAGNOSIS — S93602A Unspecified sprain of left foot, initial encounter: Secondary | ICD-10-CM | POA: Diagnosis not present

## 2020-11-16 ENCOUNTER — Ambulatory Visit: Payer: BC Managed Care – PPO | Admitting: Nurse Practitioner

## 2020-11-24 DIAGNOSIS — S93602A Unspecified sprain of left foot, initial encounter: Secondary | ICD-10-CM | POA: Diagnosis not present

## 2020-11-28 DIAGNOSIS — J310 Chronic rhinitis: Secondary | ICD-10-CM | POA: Diagnosis not present

## 2020-11-28 DIAGNOSIS — J301 Allergic rhinitis due to pollen: Secondary | ICD-10-CM | POA: Diagnosis not present

## 2020-12-15 DIAGNOSIS — M25572 Pain in left ankle and joints of left foot: Secondary | ICD-10-CM | POA: Diagnosis not present

## 2020-12-15 DIAGNOSIS — M79672 Pain in left foot: Secondary | ICD-10-CM | POA: Diagnosis not present

## 2020-12-23 ENCOUNTER — Telehealth: Payer: Self-pay

## 2020-12-23 DIAGNOSIS — R41840 Attention and concentration deficit: Secondary | ICD-10-CM

## 2020-12-23 MED ORDER — AMPHETAMINE-DEXTROAMPHETAMINE 5 MG PO TABS: ORAL_TABLET | ORAL | 0 refills | Status: DC

## 2020-12-23 NOTE — Telephone Encounter (Signed)
7 days of adderall sent to pharmacy

## 2020-12-23 NOTE — Telephone Encounter (Signed)
Pt notified that we send med  

## 2020-12-27 ENCOUNTER — Encounter: Payer: Self-pay | Admitting: Nurse Practitioner

## 2020-12-27 ENCOUNTER — Other Ambulatory Visit: Payer: Self-pay

## 2020-12-27 ENCOUNTER — Ambulatory Visit (INDEPENDENT_AMBULATORY_CARE_PROVIDER_SITE_OTHER): Payer: BC Managed Care – PPO | Admitting: Nurse Practitioner

## 2020-12-27 VITALS — BP 124/60 | HR 74 | Temp 98.4°F | Resp 16 | Ht 70.5 in | Wt 230.0 lb

## 2020-12-27 DIAGNOSIS — R41840 Attention and concentration deficit: Secondary | ICD-10-CM | POA: Diagnosis not present

## 2020-12-27 DIAGNOSIS — F5101 Primary insomnia: Secondary | ICD-10-CM

## 2020-12-27 DIAGNOSIS — J301 Allergic rhinitis due to pollen: Secondary | ICD-10-CM | POA: Diagnosis not present

## 2020-12-27 DIAGNOSIS — J029 Acute pharyngitis, unspecified: Secondary | ICD-10-CM | POA: Diagnosis not present

## 2020-12-27 LAB — POCT RAPID STREP A (OFFICE): Rapid Strep A Screen: NEGATIVE

## 2020-12-27 MED ORDER — AMPHETAMINE-DEXTROAMPHETAMINE 5 MG PO TABS: ORAL_TABLET | ORAL | 0 refills | Status: DC

## 2020-12-27 MED ORDER — CETIRIZINE HCL 10 MG PO TABS: 10.0000 mg | ORAL_TABLET | Freq: Every day | ORAL | 1 refills | Status: DC

## 2020-12-27 MED ORDER — ZOLPIDEM TARTRATE 10 MG PO TABS: ORAL_TABLET | ORAL | 2 refills | Status: DC

## 2020-12-27 MED ORDER — IBUPROFEN 600 MG PO TABS: 600.0000 mg | ORAL_TABLET | Freq: Four times a day (QID) | ORAL | 1 refills | Status: DC

## 2020-12-27 NOTE — Progress Notes (Signed)
[facility] [place], [place] [address]  Internal MEDICINE  Office Visit Note  Patient Name: Deanna Sims  [id number]  [id number]  Date of Service: 12/27/2020  Chief Complaint  Patient presents with   Follow-up   Medication Refill   ADHD   Sore Throat    HPI Dodie presents for a follow up visit for medication refills, ADHD and postnasal drip. Her current dose of adderall is effective. She denies any adverse side effects related to the medication. She reports that her problems with attention and concentration are well controlled with the adderall. She takes [place] for sleep and is requesting a refill for this as well. She reports this medication works well for her.  She has had a sore throat since last week. She has chronic allergic rhinitis with postnasal drip. She reports having a lot of drainage lately. She has cetirizine prescribed but has not been taking it daily. Patient swabbed for rapid strep A test which was negative.     Current Medication: Outpatient Encounter Medications as of 12/27/2020  Medication Sig   fluticasone (FLONASE) 50 MCG/ACT nasal spray Place 1 spray into both nostrils daily as needed for allergies or rhinitis.   SPRINTEC 28 0.25-35 MG-MCG tablet Take 1 tablet by mouth daily.   [DISCONTINUED] amphetamine-dextroamphetamine (ADDERALL) 5 MG tablet Take one to two tablets daily as needed for ADHD.   [DISCONTINUED] amphetamine-dextroamphetamine (ADDERALL) 5 MG tablet Take one to two tablets daily as needed for ADHD.   [DISCONTINUED] amphetamine-dextroamphetamine (ADDERALL) 5 MG tablet Take one to two tablets daily as needed for ADHD.   [DISCONTINUED] cetirizine (ZYRTEC) 10 MG tablet Take 1 tablet (10 mg total) by mouth daily.   [DISCONTINUED] ibuprofen (ADVIL) 600 MG tablet Take 1 tablet (600 mg total) by mouth every 6 (six) hours.   [DISCONTINUED] zolpidem (AMBIEN) 10 MG tablet Take one tab po qhs as needed, may take one extra if needed    amphetamine-dextroamphetamine (ADDERALL) 5 MG tablet Take one to two tablets daily as needed for ADHD.   [START ON 01/24/2021] amphetamine-dextroamphetamine (ADDERALL) 5 MG tablet Take one to two tablets daily as needed for ADHD.   [START ON 02/21/2021] amphetamine-dextroamphetamine (ADDERALL) 5 MG tablet Take one to two tablets daily as needed for ADHD.   cetirizine (ZYRTEC) 10 MG tablet Take 1 tablet (10 mg total) by mouth daily.   ibuprofen (ADVIL) 600 MG tablet Take 1 tablet (600 mg total) by mouth every 6 (six) hours.   zolpidem (AMBIEN) 10 MG tablet Take one tab po qhs as needed, may take one extra if needed   No facility-administered encounter medications on file as of 12/27/2020.    Surgical History: Past Surgical History:  Procedure Laterality Date   CESAREAN SECTION N/A 08/11/2015   Procedure: Primary CESAREAN SECTION;  Surgeon: Brien Few, MD;  Location: [facility];  Service: Obstetrics;  Laterality: N/A;  EDD: 08/17/15 Allergy: PCN   CESAREAN SECTION N/A 08/14/2017   Procedure: Repeat CESAREAN SECTION;  Surgeon: Brien Few, MD;  Location: [facility];  Service: Obstetrics;  Laterality: N/A;  EDD: 08/20/17 Allergy: Penicillin   DILATION AND EVACUATION N/A 03/15/2014   Procedure: DILATATION AND EVACUATION;  Surgeon: Lyman Speller, MD;  Location: [facility];  Service: Gynecology;  Laterality: N/A;   LAPAROSCOPY N/A 03/15/2014   Procedure: LAPAROSCOPY OPERATIVE;  Surgeon: Lyman Speller, MD;  Location: [facility];  Service: Gynecology;  Laterality: N/A;   [place]  History: Past Medical History:  Diagnosis Date   ADHD    Bell palsy    Insomnia    Postpartum care following repeat cesarean delivery (6/29) 08/12/2015   Seasonal allergies    Sinusitis     Family History: Family History  Problem Relation Age of Onset   Uterine cancer Mother 36   Cancer - Ovarian Maternal Grandmother    Cancer - Lung Maternal Grandmother     Cancer - Lung Maternal Grandfather     Social History   Socioeconomic History   Marital status: Married    Spouse name: Not on file   Number of children: Not on file   Years of education: Not on file   Highest education level: Not on file  Occupational History   Not on file  Tobacco Use   Smoking status: Former    Packs/day: 1.00    Years: 13.00    Pack years: 13.00    Types: Cigarettes    Quit date: 08/12/2013    Years since quitting: 7.3   Smokeless tobacco: Never  Vaping Use   Vaping Use: Never used  Substance and Sexual Activity   Alcohol use: Yes    Alcohol/week: 5.0 standard drinks    Types: 5 Glasses of wine per week    Comment: ocassionally   Drug use: No   Sexual activity: Yes    Partners: Male    Birth control/protection: None    Comment: approx 7-[redacted] wks gestation?? per patient  Other Topics Concern   Not on file  Social History Narrative   Not on file   Social Determinants of Health   Financial Resource Strain: Not on file  Food Insecurity: Not on file  Transportation Needs: Not on file  Physical Activity: Not on file  Stress: Not on file  Social Connections: Not on file  Intimate Partner Violence: Not on file      Review of Systems  Constitutional:  Negative for chills, diaphoresis, fatigue, fever and unexpected weight change.  HENT:  Positive for congestion, postnasal drip, rhinorrhea, sinus pressure, sinus pain and sore throat. Negative for sneezing.   Eyes:  Negative for redness.  Respiratory: Negative.  Negative for cough, chest tightness, shortness of breath and wheezing.   Cardiovascular: Negative.  Negative for chest pain and palpitations.  Gastrointestinal: Negative.  Negative for abdominal pain, constipation, diarrhea, nausea and vomiting.  Genitourinary:  Negative for dysuria and frequency.  Musculoskeletal:  Negative for arthralgias, back pain, joint swelling and neck pain.  Skin:  Negative for rash.  Neurological: Negative.  Negative  for tremors and numbness.  Hematological:  Negative for adenopathy. Does not bruise/bleed easily.  Psychiatric/Behavioral:  Negative for behavioral problems (Depression), sleep disturbance and suicidal ideas. The patient is not nervous/anxious.    Vital Signs: BP 124/60   Pulse 74   Temp 98.4 F (36.9 C)   Resp 16   Ht 5' 10.5" (1.791 m)   Wt 230 lb (104.3 kg)   SpO2 98%   BMI 32.54 kg/m    Physical Exam Vitals reviewed.  Constitutional:      General: She is not in acute distress.    Appearance: Normal appearance. She is obese. She is not ill-appearing.  HENT:     Head: Normocephalic and atraumatic.     Mouth/Throat:     Mouth: Mucous membranes are moist.     Pharynx: Posterior oropharyngeal erythema (slight) present.  Eyes:     Pupils: Pupils are equal, round, and reactive  to light.  Cardiovascular:     Rate and Rhythm: Normal rate and regular rhythm.  Pulmonary:     Effort: Pulmonary effort is normal. No respiratory distress.  Skin:    Capillary Refill: Capillary refill takes less than 2 seconds.  Neurological:     Mental Status: She is alert and oriented to person, place, and time.  Psychiatric:        Mood and Affect: Mood normal.        Behavior: Behavior normal.       Assessment/Plan: 1. Sore throat Negative for strep - POCT rapid strep A  2. Non-seasonal allergic rhinitis due to pollen Patient encouraged to take cetirizine daily to help improve postnasal drip. If no improvement after a few weeks please call the clinic. - cetirizine (ZYRTEC) 10 MG tablet; Take 1 tablet (10 mg total) by mouth daily.  Dispense: 90 tablet; Refill: 1  3. Primary insomnia Stable, refill ordered for 3 months - zolpidem (AMBIEN) 10 MG tablet; Take one tab po qhs as needed, may take one extra if needed  Dispense: 30 tablet; Refill: 2  4. Attention and concentration deficit Medication and dose remain effective, continue as prescribed, 3 months of prescriptions sent to  pharmacy - amphetamine-dextroamphetamine (ADDERALL) 5 MG tablet; Take one to two tablets daily as needed for ADHD.  Dispense: 60 tablet; Refill: 0 - amphetamine-dextroamphetamine (ADDERALL) 5 MG tablet; Take one to two tablets daily as needed for ADHD.  Dispense: 60 tablet; Refill: 0 - amphetamine-dextroamphetamine (ADDERALL) 5 MG tablet; Take one to two tablets daily as needed for ADHD.  Dispense: 60 tablet; Refill: 0   General Counseling: Elnita verbalizes understanding of the findings of todays visit and agrees with plan of treatment. I have discussed any further diagnostic evaluation that may be needed or ordered today. We also reviewed her medications today. she has been encouraged to call the office with any questions or concerns that should arise related to todays visit.    Orders Placed This Encounter  Procedures   POCT rapid strep A    Meds ordered this encounter  Medications   zolpidem (AMBIEN) 10 MG tablet    Sig: Take one tab po qhs as needed, may take one extra if needed    Dispense:  30 tablet    Refill:  2    Fill for mid to late august   ibuprofen (ADVIL) 600 MG tablet    Sig: Take 1 tablet (600 mg total) by mouth every 6 (six) hours.    Dispense:  30 tablet    Refill:  1   cetirizine (ZYRTEC) 10 MG tablet    Sig: Take 1 tablet (10 mg total) by mouth daily.    Dispense:  90 tablet    Refill:  1   amphetamine-dextroamphetamine (ADDERALL) 5 MG tablet    Sig: Take one to two tablets daily as needed for ADHD.    Dispense:  60 tablet    Refill:  0    Fill for november   amphetamine-dextroamphetamine (ADDERALL) 5 MG tablet    Sig: Take one to two tablets daily as needed for ADHD.    Dispense:  60 tablet    Refill:  0    Fill for december   amphetamine-dextroamphetamine (ADDERALL) 5 MG tablet    Sig: Take one to two tablets daily as needed for ADHD.    Dispense:  60 tablet    Refill:  0    Fill for january    Return  in about 3 months (around 03/29/2021) for F/U,  anxiety med refill, Ouida Abeyta PCP will need UDS next office visit.   Total time spent:30 Minutes Time spent includes review of chart, medications, test results, and follow up plan with the patient.   [place] Controlled Substance Database was reviewed by me.  This patient was seen by Jonetta Osgood, FNP-C in collaboration with Dr. Clayborn Bigness as a part of collaborative care agreement.   Delenn Ahn R. Valetta Fuller, MSN, FNP-C Internal medicine

## 2021-03-09 DIAGNOSIS — R059 Cough, unspecified: Secondary | ICD-10-CM | POA: Diagnosis not present

## 2021-03-09 DIAGNOSIS — R070 Pain in throat: Secondary | ICD-10-CM | POA: Diagnosis not present

## 2021-03-09 DIAGNOSIS — J039 Acute tonsillitis, unspecified: Secondary | ICD-10-CM | POA: Diagnosis not present

## 2021-03-09 DIAGNOSIS — Z20822 Contact with and (suspected) exposure to covid-19: Secondary | ICD-10-CM | POA: Diagnosis not present

## 2021-03-10 ENCOUNTER — Telehealth: Payer: Self-pay

## 2021-03-10 NOTE — Telephone Encounter (Signed)
Left vm and sent mychart message to confirm 03/14/21 appointment-Toni

## 2021-03-14 ENCOUNTER — Ambulatory Visit (INDEPENDENT_AMBULATORY_CARE_PROVIDER_SITE_OTHER): Payer: BC Managed Care – PPO | Admitting: Nurse Practitioner

## 2021-03-14 ENCOUNTER — Other Ambulatory Visit: Payer: Self-pay

## 2021-03-14 ENCOUNTER — Encounter: Payer: Self-pay | Admitting: Nurse Practitioner

## 2021-03-14 VITALS — BP 130/84 | HR 75 | Temp 98.5°F | Resp 16 | Ht 70.5 in | Wt 235.0 lb

## 2021-03-14 DIAGNOSIS — E538 Deficiency of other specified B group vitamins: Secondary | ICD-10-CM

## 2021-03-14 DIAGNOSIS — N951 Menopausal and female climacteric states: Secondary | ICD-10-CM

## 2021-03-14 DIAGNOSIS — R5383 Other fatigue: Secondary | ICD-10-CM | POA: Diagnosis not present

## 2021-03-14 DIAGNOSIS — Z1211 Encounter for screening for malignant neoplasm of colon: Secondary | ICD-10-CM

## 2021-03-14 DIAGNOSIS — E559 Vitamin D deficiency, unspecified: Secondary | ICD-10-CM | POA: Diagnosis not present

## 2021-03-14 DIAGNOSIS — R41840 Attention and concentration deficit: Secondary | ICD-10-CM

## 2021-03-14 DIAGNOSIS — E782 Mixed hyperlipidemia: Secondary | ICD-10-CM

## 2021-03-14 DIAGNOSIS — Z79899 Other long term (current) drug therapy: Secondary | ICD-10-CM

## 2021-03-14 DIAGNOSIS — Z0001 Encounter for general adult medical examination with abnormal findings: Secondary | ICD-10-CM | POA: Diagnosis not present

## 2021-03-14 DIAGNOSIS — F5101 Primary insomnia: Secondary | ICD-10-CM

## 2021-03-14 DIAGNOSIS — R300 Dysuria: Secondary | ICD-10-CM

## 2021-03-14 DIAGNOSIS — Z1212 Encounter for screening for malignant neoplasm of rectum: Secondary | ICD-10-CM

## 2021-03-14 LAB — POCT URINE DRUG SCREEN
Methylenedioxyamphetamine: NOT DETECTED
POC Amphetamine UR: NOT DETECTED
POC BENZODIAZEPINES UR: NOT DETECTED
POC Barbiturate UR: NOT DETECTED
POC Cocaine UR: NOT DETECTED
POC Ecstasy UR: NOT DETECTED
POC Marijuana UR: NOT DETECTED
POC Methadone UR: NOT DETECTED
POC Methamphetamine UR: NOT DETECTED
POC Opiate Ur: NOT DETECTED
POC Oxycodone UR: NOT DETECTED
POC PHENCYCLIDINE UR: NOT DETECTED
POC TRICYCLICS UR: NOT DETECTED

## 2021-03-14 MED ORDER — AMPHETAMINE-DEXTROAMPHETAMINE 5 MG PO TABS: ORAL_TABLET | ORAL | 0 refills | Status: DC

## 2021-03-14 MED ORDER — ZOLPIDEM TARTRATE 10 MG PO TABS: ORAL_TABLET | ORAL | 2 refills | Status: DC

## 2021-03-14 NOTE — Progress Notes (Signed)
[facility] [place], [place] [address]  Internal MEDICINE  Office Visit Note  Patient Name: Deanna Sims  [id number]  [id number]  Date of Service: 03/14/2021  Chief Complaint  Patient presents with   Annual Exam   Medication Refill    HPI Deanna Sims presents for an annual well visit and physical exam. She is a well appearing 46 yo female with ADHD and insomnia. She is due for colorectal cancer screening this year and does not have any family history of colorectal cancer and would like to try the cologard test. She is due for mammogram and pap smear in march and is scheduled with her OBGYN at [facility] in [place]. She is due for routine labs. She reports feeling tired and fatigued, low energy all the time. She wants to check her hormone levels too. She denies any pain. She is due for UDS and refills of adderall.  She has no other questions or concerns.    Current Medication: Outpatient Encounter Medications as of 03/14/2021  Medication Sig   cetirizine (ZYRTEC) 10 MG tablet Take 1 tablet (10 mg total) by mouth daily.   fluticasone (FLONASE) 50 MCG/ACT nasal spray Place 1 spray into both nostrils daily as needed for allergies or rhinitis.   ibuprofen (ADVIL) 600 MG tablet Take 1 tablet (600 mg total) by mouth every 6 (six) hours.   SPRINTEC 28 0.25-35 MG-MCG tablet Take 1 tablet by mouth daily.   [DISCONTINUED] amphetamine-dextroamphetamine (ADDERALL) 5 MG tablet Take one to two tablets daily as needed for ADHD.   [DISCONTINUED] amphetamine-dextroamphetamine (ADDERALL) 5 MG tablet Take one to two tablets daily as needed for ADHD.   [DISCONTINUED] amphetamine-dextroamphetamine (ADDERALL) 5 MG tablet Take one to two tablets daily as needed for ADHD.   [DISCONTINUED] zolpidem (AMBIEN) 10 MG tablet Take one tab po qhs as needed, may take one extra if needed   amphetamine-dextroamphetamine (ADDERALL) 5 MG tablet Take one to two tablets daily as needed for ADHD.    [START ON 04/11/2021] amphetamine-dextroamphetamine (ADDERALL) 5 MG tablet Take one to two tablets daily as needed for ADHD.   [START ON 05/09/2021] amphetamine-dextroamphetamine (ADDERALL) 5 MG tablet Take one to two tablets daily as needed for ADHD.   zolpidem (AMBIEN) 10 MG tablet Take one tab po qhs as needed, may take one extra if needed   No facility-administered encounter medications on file as of 03/14/2021.    Surgical History: Past Surgical History:  Procedure Laterality Date   CESAREAN SECTION N/A 08/11/2015   Procedure: Primary CESAREAN SECTION;  Surgeon: Brien Few, MD;  Location: [facility];  Service: Obstetrics;  Laterality: N/A;  EDD: 08/17/15 Allergy: PCN   CESAREAN SECTION N/A 08/14/2017   Procedure: Repeat CESAREAN SECTION;  Surgeon: Brien Few, MD;  Location: [facility];  Service: Obstetrics;  Laterality: N/A;  EDD: 08/20/17 Allergy: Penicillin   DILATION AND EVACUATION N/A 03/15/2014   Procedure: DILATATION AND EVACUATION;  Surgeon: Lyman Speller, MD;  Location: [facility];  Service: Gynecology;  Laterality: N/A;   LAPAROSCOPY N/A 03/15/2014   Procedure: LAPAROSCOPY OPERATIVE;  Surgeon: Lyman Speller, MD;  Location: [facility];  Service: Gynecology;  Laterality: N/A;   WISDOM TOOTH EXTRACTION      Medical History: Past Medical History:  Diagnosis Date   ADHD    Bell palsy    Insomnia    Postpartum care following repeat cesarean delivery (6/29) 08/12/2015   Seasonal allergies    Sinusitis     Family History:  Family History  Problem Relation Age of Onset   Uterine cancer Mother 32   Cancer - Ovarian Maternal Grandmother    Cancer - Lung Maternal Grandmother    Cancer - Lung Maternal Grandfather     Social History   Socioeconomic History   Marital status: Married    Spouse name: Not on file   Number of children: Not on file   Years of education: Not on file   Highest education level: Not on file  Occupational History   Not on file   Tobacco Use   Smoking status: Former    Packs/day: 1.00    Years: 13.00    Pack years: 13.00    Types: Cigarettes    Quit date: 08/12/2013    Years since quitting: 7.5   Smokeless tobacco: Never  Vaping Use   Vaping Use: Never used  Substance and Sexual Activity   Alcohol use: Yes    Alcohol/week: 5.0 standard drinks    Types: 5 Glasses of wine per week    Comment: ocassionally   Drug use: No   Sexual activity: Yes    Partners: Male    Birth control/protection: None    Comment: approx 7-[redacted] wks gestation?? per patient  Other Topics Concern   Not on file  Social History Narrative   Not on file   Social Determinants of Health   Financial Resource Strain: Not on file  Food Insecurity: Not on file  Transportation Needs: Not on file  Physical Activity: Not on file  Stress: Not on file  Social Connections: Not on file  Intimate Partner Violence: Not on file      Review of Systems  Constitutional:  Negative for activity change, appetite change, chills, fatigue, fever and unexpected weight change.  HENT: Negative.  Negative for congestion, ear pain, rhinorrhea, sore throat and trouble swallowing.   Eyes: Negative.   Respiratory: Negative.  Negative for cough, chest tightness, shortness of breath and wheezing.   Cardiovascular: Negative.  Negative for chest pain.  Gastrointestinal: Negative.  Negative for abdominal pain, blood in stool, constipation, diarrhea, nausea and vomiting.  Endocrine: Negative.   Genitourinary: Negative.  Negative for difficulty urinating, dysuria, frequency, hematuria and urgency.  Musculoskeletal: Negative.  Negative for arthralgias, back pain, joint swelling, myalgias and neck pain.  Skin: Negative.  Negative for rash and wound.  Allergic/Immunologic: Negative.  Negative for immunocompromised state.  Neurological: Negative.  Negative for dizziness, seizures, numbness and headaches.  Hematological: Negative.   Psychiatric/Behavioral: Negative.   Negative for behavioral problems, self-injury and suicidal ideas. The patient is not nervous/anxious.    Vital Signs: BP 130/84    Pulse 75    Temp 98.5 F (36.9 C)    Resp 16    Ht 5' 10.5" (1.791 m)    Wt 235 lb (106.6 kg)    SpO2 97%    BMI 33.24 kg/m    Physical Exam Vitals reviewed.  Constitutional:      General: She is not in acute distress.    Appearance: Normal appearance. She is well-developed and well-groomed. She is obese. She is not ill-appearing or diaphoretic.  HENT:     Head: Normocephalic and atraumatic.     Right Ear: Tympanic membrane, ear canal and external ear normal.     Left Ear: Tympanic membrane, ear canal and external ear normal.     Nose: Nose normal. No congestion or rhinorrhea.     Mouth/Throat:     Lips: Pink.  Mouth: Mucous membranes are moist.     Pharynx: Oropharynx is clear. Uvula midline. No oropharyngeal exudate or posterior oropharyngeal erythema.  Eyes:     General: Lids are normal. Vision grossly intact. Gaze aligned appropriately. No scleral icterus.       Right eye: No discharge.        Left eye: No discharge.     Extraocular Movements: Extraocular movements intact.     Conjunctiva/sclera: Conjunctivae normal.     Pupils: Pupils are equal, round, and reactive to light.     Funduscopic exam:    Right eye: Red reflex present.        Left eye: Red reflex present. Neck:     Thyroid: No thyromegaly.     Vascular: No JVD.     Trachea: Trachea and phonation normal. No tracheal deviation.  Cardiovascular:     Rate and Rhythm: Normal rate and regular rhythm.     Pulses: Normal pulses.     Heart sounds: Normal heart sounds, S1 normal and S2 normal. No murmur heard.   No friction rub. No gallop.  Pulmonary:     Effort: Pulmonary effort is normal. No accessory muscle usage or respiratory distress.     Breath sounds: Normal breath sounds and air entry. No stridor. No wheezing or rales.  Chest:     Chest wall: No tenderness.     Comments:  Declined breast exam Abdominal:     General: Bowel sounds are normal. There is no distension.     Palpations: Abdomen is soft. There is no shifting dullness, fluid wave, mass or pulsatile mass.     Tenderness: There is no abdominal tenderness. There is no guarding or rebound.  Musculoskeletal:        General: No tenderness or deformity. Normal range of motion.     Cervical back: Normal range of motion and neck supple.     Right lower leg: No edema.     Left lower leg: No edema.  Lymphadenopathy:     Cervical: No cervical adenopathy.  Skin:    General: Skin is warm and dry.     Capillary Refill: Capillary refill takes less than 2 seconds.     Coloration: Skin is not pale.     Findings: No erythema or rash.  Neurological:     Mental Status: She is alert and oriented to person, place, and time.     Cranial Nerves: No cranial nerve deficit.     Motor: No abnormal muscle tone.     Coordination: Coordination normal.     Gait: Gait normal.     Deep Tendon Reflexes: Reflexes are normal and symmetric.  Psychiatric:        Mood and Affect: Mood normal.        Behavior: Behavior normal. Behavior is cooperative.        Thought Content: Thought content normal.        Judgment: Judgment normal.       Assessment/Plan: 1. Encounter for routine adult health examination with abnormal findings Age-appropriate preventive screenings and vaccinations discussed, annual physical exam completed. Routine labs for health maintenance ordered, see below. PHM updated.   2. Perimenopause Additional labs ordered. Patient reports hot flashes, mood swings and foggy brain.  - B12 and Folate Panel - CBC with Differential/Platelet - CMP14+EGFR - TSH + free T4 - FSH/LH - Estradiol - Testosterone,Free and Total  3. Other fatigue Labs ordered - CBC with Differential/Platelet - CMP14+EGFR  4. B12 deficiency  Routine labs ordered - B12 and Folate Panel - CBC with Differential/Platelet -  CMP14+EGFR  5. Vitamin D deficiency Routine lab ordered - Vitamin D (25 hydroxy)  6. Mixed hyperlipidemia Routine lab ordered - Lipid Profile  7. Dysuria Routine urinalysis done - UA/M w/rflx Culture, Routine - Microscopic Examination  8. Encounter for long-term (current) use of medications UDS done. Patient ran out of medication so UDS was negative. Repeat at next office visit in 3 months.  - POCT Urine Drug Screen  9. Screening for colorectal cancer Cologard test ordered - Cologuard  10. Attention and concentration deficit Current medication and dose remain effective. Refills x3 months ordered, follow up in 3 months for additional refills.  - amphetamine-dextroamphetamine (ADDERALL) 5 MG tablet; Take one to two tablets daily as needed for ADHD.  Dispense: 60 tablet; Refill: 0 - amphetamine-dextroamphetamine (ADDERALL) 5 MG tablet; Take one to two tablets daily as needed for ADHD.  Dispense: 60 tablet; Refill: 0 - amphetamine-dextroamphetamine (ADDERALL) 5 MG tablet; Take one to two tablets daily as needed for ADHD.  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 Follow up in 3 months for additional refills - zolpidem (AMBIEN) 10 MG tablet; Take one tab po qhs as needed, may take one extra if needed  Dispense: 30 tablet; Refill: 2      General Counseling: Quinesha verbalizes understanding of the findings of todays visit and agrees with plan of treatment. I have discussed any further diagnostic evaluation that may be needed or ordered today. We also reviewed her medications today. she has been encouraged to call the office with any questions or concerns that should arise related to todays visit.    Orders Placed This Encounter  Procedures   Cologuard   B12 and Folate Panel   Vitamin D (25 hydroxy)   CBC with Differential/Platelet   CMP14+EGFR   TSH + free T4   Lipid Profile   FSH/LH   Estradiol   Testosterone,Free and Total   UA/M  w/rflx Culture, Routine   POCT Urine Drug Screen    Meds ordered this encounter  Medications   zolpidem (AMBIEN) 10 MG tablet    Sig: Take one tab po qhs as needed, may take one extra if needed    Dispense:  30 tablet    Refill:  2   amphetamine-dextroamphetamine (ADDERALL) 5 MG tablet    Sig: Take one to two tablets daily as needed for ADHD.    Dispense:  60 tablet    Refill:  0    Fill for 03/14/21   amphetamine-dextroamphetamine (ADDERALL) 5 MG tablet    Sig: Take one to two tablets daily as needed for ADHD.    Dispense:  60 tablet    Refill:  0    Fill for 04/11/21   amphetamine-dextroamphetamine (ADDERALL) 5 MG tablet    Sig: Take one to two tablets daily as needed for ADHD.    Dispense:  60 tablet    Refill:  0    Fill for 05/09/21    Return in about 3 months (around 06/11/2021) for F/U, ADHD med check, [facility] PCP.   Total time spent:30 Minutes Time spent includes review of chart, medications, test results, and follow up plan with the patient.   [place] Controlled Substance Database was reviewed by me.  This patient was seen by Jonetta Osgood, FNP-C in collaboration with Dr. Clayborn Bigness as a part of collaborative care agreement.   [place] R. Valetta Fuller, MSN, FNP-C Internal  medicine

## 2021-03-15 ENCOUNTER — Encounter: Payer: Self-pay | Admitting: Nurse Practitioner

## 2021-03-15 LAB — MICROSCOPIC EXAMINATION
Bacteria, UA: NONE SEEN
Casts: NONE SEEN /lpf
Epithelial Cells (non renal): >10 /hpf — AB (ref 0–10)

## 2021-03-15 LAB — UA/M W/RFLX CULTURE, ROUTINE
Bilirubin, UA: NEGATIVE
Glucose, UA: NEGATIVE
Ketones, UA: NEGATIVE
Leukocytes,UA: NEGATIVE
Nitrite, UA: NEGATIVE
Protein,UA: NEGATIVE
RBC, UA: NEGATIVE
Specific Gravity, UA: 1.024 (ref 1.005–1.030)
Urobilinogen, Ur: 0.2 mg/dL (ref 0.2–1.0)
pH, UA: 6 (ref 5.0–7.5)

## 2021-03-21 DIAGNOSIS — E538 Deficiency of other specified B group vitamins: Secondary | ICD-10-CM | POA: Diagnosis not present

## 2021-03-21 DIAGNOSIS — R5383 Other fatigue: Secondary | ICD-10-CM | POA: Diagnosis not present

## 2021-03-21 DIAGNOSIS — R41840 Attention and concentration deficit: Secondary | ICD-10-CM | POA: Diagnosis not present

## 2021-03-21 DIAGNOSIS — Z0001 Encounter for general adult medical examination with abnormal findings: Secondary | ICD-10-CM | POA: Diagnosis not present

## 2021-03-21 DIAGNOSIS — E559 Vitamin D deficiency, unspecified: Secondary | ICD-10-CM | POA: Diagnosis not present

## 2021-03-21 DIAGNOSIS — E782 Mixed hyperlipidemia: Secondary | ICD-10-CM | POA: Diagnosis not present

## 2021-03-24 NOTE — Progress Notes (Signed)
Please call patient and let her know her results: -CBC is normal, no anemia noted. -cholesterol levels are abnormal, total cholesterol is elevated at 219 and LDL is 134. Triglycerides are improved and within normal range now. -vitamin D level is low normal, continue OTC supplement 2000 units daily -FSH, LH and estradiol remain at premenopausal levels so she is not postmenopausal yet. Testosterone is normal -B12 and folate are normal -metabolic panel is normal, liver and kidney function are normal -thyroid levels are normal

## 2021-03-25 LAB — CMP14+EGFR
ALT: 17 IU/L (ref 0–32)
AST: 18 IU/L (ref 0–40)
Albumin/Globulin Ratio: 1.6 (ref 1.2–2.2)
Albumin: 4.3 g/dL (ref 3.8–4.8)
Alkaline Phosphatase: 77 IU/L (ref 44–121)
BUN/Creatinine Ratio: 14 (ref 9–23)
BUN: 11 mg/dL (ref 6–24)
Bilirubin Total: 0.4 mg/dL (ref 0.0–1.2)
CO2: 25 mmol/L (ref 20–29)
Calcium: 9.6 mg/dL (ref 8.7–10.2)
Chloride: 99 mmol/L (ref 96–106)
Creatinine, Ser: 0.8 mg/dL (ref 0.57–1.00)
Globulin, Total: 2.7 g/dL (ref 1.5–4.5)
Glucose: 93 mg/dL (ref 70–99)
Potassium: 4.5 mmol/L (ref 3.5–5.2)
Sodium: 138 mmol/L (ref 134–144)
Total Protein: 7 g/dL (ref 6.0–8.5)
eGFR: 93 mL/min/{1.73_m2} (ref >59)

## 2021-03-25 LAB — LIPID PANEL
Chol/HDL Ratio: 3.7 ratio (ref 0.0–4.4)
Cholesterol, Total: 219 mg/dL — ABNORMAL HIGH (ref 100–199)
HDL: 60 mg/dL (ref >39)
LDL Chol Calc (NIH): 134 mg/dL — ABNORMAL HIGH (ref 0–99)
Triglycerides: 141 mg/dL (ref 0–149)
VLDL Cholesterol Cal: 25 mg/dL (ref 5–40)

## 2021-03-25 LAB — CBC WITH DIFFERENTIAL/PLATELET
Basophils Absolute: 0.1 10*3/uL (ref 0.0–0.2)
Basos: 1 %
EOS (ABSOLUTE): 1 10*3/uL — ABNORMAL HIGH (ref 0.0–0.4)
Eos: 12 %
Hematocrit: 42.2 % (ref 34.0–46.6)
Hemoglobin: 14 g/dL (ref 11.1–15.9)
Immature Grans (Abs): 0 10*3/uL (ref 0.0–0.1)
Immature Granulocytes: 0 %
Lymphocytes Absolute: 2 10*3/uL (ref 0.7–3.1)
Lymphs: 23 %
MCH: 29.5 pg (ref 26.6–33.0)
MCHC: 33.2 g/dL (ref 31.5–35.7)
MCV: 89 fL (ref 79–97)
Monocytes Absolute: 0.5 10*3/uL (ref 0.1–0.9)
Monocytes: 6 %
Neutrophils Absolute: 4.8 10*3/uL (ref 1.4–7.0)
Neutrophils: 58 %
Platelets: 343 10*3/uL (ref 150–450)
RBC: 4.74 x10E6/uL (ref 3.77–5.28)
RDW: 11.7 % (ref 11.7–15.4)
WBC: 8.4 10*3/uL (ref 3.4–10.8)

## 2021-03-25 LAB — FSH/LH
FSH: 19.2 m[IU]/mL
LH: 10.3 m[IU]/mL

## 2021-03-25 LAB — TESTOSTERONE,FREE AND TOTAL
Testosterone, Free: 0.8 pg/mL (ref 0.0–4.2)
Testosterone: 5 ng/dL (ref 4–50)

## 2021-03-25 LAB — B12 AND FOLATE PANEL
Folate: 13.5 ng/mL (ref >3.0)
Vitamin B-12: 502 pg/mL (ref 232–1245)

## 2021-03-25 LAB — TSH+FREE T4
Free T4: 1.13 ng/dL (ref 0.82–1.77)
TSH: 0.612 u[IU]/mL (ref 0.450–4.500)

## 2021-03-25 LAB — VITAMIN D 25 HYDROXY (VIT D DEFICIENCY, FRACTURES): Vit D, 25-Hydroxy: 33.7 ng/mL (ref 30.0–100.0)

## 2021-03-25 LAB — ESTRADIOL: Estradiol: 26.2 pg/mL

## 2021-03-27 ENCOUNTER — Telehealth: Payer: Self-pay

## 2021-03-27 NOTE — Telephone Encounter (Signed)
-----   Message from Jonetta Osgood, NP sent at 03/24/2021  3:42 PM EST ----- Please call patient and let her know her results: -CBC is normal, no anemia noted. -cholesterol levels are abnormal, total cholesterol is elevated at 219 and LDL is 134. Triglycerides are improved and within normal range now. -vitamin D level is low normal, continue OTC supplement 2000 units daily -FSH, LH and estradiol remain at premenopausal levels so she is not postmenopausal yet. Testosterone is normal -B12 and folate are normal -metabolic panel is normal, liver and kidney function are normal -thyroid levels are normal

## 2021-04-04 ENCOUNTER — Encounter: Payer: Self-pay | Admitting: Nurse Practitioner

## 2021-04-04 ENCOUNTER — Other Ambulatory Visit: Payer: Self-pay

## 2021-04-04 ENCOUNTER — Ambulatory Visit: Payer: BC Managed Care – PPO | Admitting: Nurse Practitioner

## 2021-04-04 VITALS — BP 120/90 | HR 75 | Temp 98.4°F | Resp 16 | Ht 71.0 in | Wt 237.0 lb

## 2021-04-04 DIAGNOSIS — E559 Vitamin D deficiency, unspecified: Secondary | ICD-10-CM | POA: Diagnosis not present

## 2021-04-04 DIAGNOSIS — F321 Major depressive disorder, single episode, moderate: Secondary | ICD-10-CM

## 2021-04-04 DIAGNOSIS — E782 Mixed hyperlipidemia: Secondary | ICD-10-CM

## 2021-04-04 DIAGNOSIS — R41840 Attention and concentration deficit: Secondary | ICD-10-CM | POA: Diagnosis not present

## 2021-04-04 MED ORDER — SERTRALINE HCL 50 MG PO TABS: 50.0000 mg | ORAL_TABLET | Freq: Every day | ORAL | 3 refills | Status: DC

## 2021-04-04 NOTE — Progress Notes (Cosign Needed)
[facility] [place], [place] [address]  Internal MEDICINE  Office Visit Note  Patient Name: Deanna Sims  [id number]  [id number]  Date of Service: 04/04/2021  Chief Complaint  Patient presents with   Follow-up    Labs    ADHD   Results    HPI Deanna Sims presents for a follow up visit for ADHD, depression and to discuss lab results.  Lab results discussed: thyroid levels, metabolic panel, E70, folate, estradiol, FSH and LH are all normal. Vitamin D is low normal. CBC is grossly normal. Lipid panel is abnormal for elevated LDL at 134 and total cholesterol of 219, ratio is 3.7.  She reports having a depressed mood and having postpartum depression after having her second child possibly. She is interested in trying medication to help her feel better. She reports depressed mood, loss of interest in things she normally likes to do, feeling tired and low energy, and sleeping more.  She is not due for refills for ADHD medication, she is good through April.    Current Medication: Outpatient Encounter Medications as of 04/04/2021  Medication Sig   amphetamine-dextroamphetamine (ADDERALL) 5 MG tablet Take one to two tablets daily as needed for ADHD.   amphetamine-dextroamphetamine (ADDERALL) 5 MG tablet Take one to two tablets daily as needed for ADHD.   [START ON 05/09/2021] amphetamine-dextroamphetamine (ADDERALL) 5 MG tablet Take one to two tablets daily as needed for ADHD.   cetirizine (ZYRTEC) 10 MG tablet Take 1 tablet (10 mg total) by mouth daily.   fluticasone (FLONASE) 50 MCG/ACT nasal spray Place 1 spray into both nostrils daily as needed for allergies or rhinitis.   ibuprofen (ADVIL) 600 MG tablet Take 1 tablet (600 mg total) by mouth every 6 (six) hours.   sertraline (ZOLOFT) 50 MG tablet Take 1 tablet (50 mg total) by mouth daily.   SPRINTEC 28 0.25-35 MG-MCG tablet Take 1 tablet by mouth daily.   zolpidem (AMBIEN) 10 MG tablet Take one tab po qhs as needed,  may take one extra if needed   No facility-administered encounter medications on file as of 04/04/2021.    Surgical History: Past Surgical History:  Procedure Laterality Date   CESAREAN SECTION N/A 08/11/2015   Procedure: Primary CESAREAN SECTION;  Surgeon: Brien Few, MD;  Location: [facility];  Service: Obstetrics;  Laterality: N/A;  EDD: 08/17/15 Allergy: PCN   CESAREAN SECTION N/A 08/14/2017   Procedure: Repeat CESAREAN SECTION;  Surgeon: Brien Few, MD;  Location: [facility];  Service: Obstetrics;  Laterality: N/A;  EDD: 08/20/17 Allergy: Penicillin   DILATION AND EVACUATION N/A 03/15/2014   Procedure: DILATATION AND EVACUATION;  Surgeon: Lyman Speller, MD;  Location: [facility];  Service: Gynecology;  Laterality: N/A;   LAPAROSCOPY N/A 03/15/2014   Procedure: LAPAROSCOPY OPERATIVE;  Surgeon: Lyman Speller, MD;  Location: [facility];  Service: Gynecology;  Laterality: N/A;   WISDOM TOOTH EXTRACTION      Medical History: Past Medical History:  Diagnosis Date   ADHD    Bell palsy    Insomnia    Postpartum care following repeat cesarean delivery (6/29) 08/12/2015   Seasonal allergies    Sinusitis     Family History: Family History  Problem Relation Age of Onset   Uterine cancer Mother 40   Cancer - Ovarian Maternal Grandmother    Cancer - Lung Maternal Grandmother    Cancer - Lung Maternal Grandfather     Social History   Socioeconomic History  Marital status: Married    Spouse name: Not on file   Number of children: Not on file   Years of education: Not on file   Highest education level: Not on file  Occupational History   Not on file  Tobacco Use   Smoking status: Former    Packs/day: 1.00    Years: 13.00    Pack years: 13.00    Types: Cigarettes    Quit date: 08/12/2013    Years since quitting: 7.7   Smokeless tobacco: Never  Vaping Use   Vaping Use: Never used  Substance and Sexual Activity   Alcohol use: Yes    Alcohol/week: 5.0  standard drinks    Types: 5 Glasses of wine per week    Comment: ocassionally   Drug use: No   Sexual activity: Yes    Partners: Male    Birth control/protection: None    Comment: approx 7-[redacted] wks gestation?? per patient  Other Topics Concern   Not on file  Social History Narrative   Not on file   Social Determinants of Health   Financial Resource Strain: Not on file  Food Insecurity: Not on file  Transportation Needs: Not on file  Physical Activity: Not on file  Stress: Not on file  Social Connections: Not on file  Intimate Partner Violence: Not on file      Review of Systems  Constitutional:  Negative for chills, fatigue and unexpected weight change.  HENT:  Negative for congestion, rhinorrhea, sneezing and sore throat.   Eyes:  Negative for redness.  Respiratory:  Negative for cough, chest tightness and shortness of breath.   Cardiovascular:  Negative for chest pain and palpitations.  Gastrointestinal:  Negative for abdominal pain, constipation, diarrhea, nausea and vomiting.  Genitourinary:  Negative for dysuria and frequency.  Musculoskeletal:  Negative for arthralgias, back pain, joint swelling and neck pain.  Skin:  Negative for rash.  Neurological: Negative.  Negative for tremors and numbness.  Hematological:  Negative for adenopathy. Does not bruise/bleed easily.  Psychiatric/Behavioral:  Positive for behavioral problems (Depression), dysphoric mood and sleep disturbance. Negative for suicidal ideas. The patient is not nervous/anxious.    Vital Signs: BP 120/90    Pulse 75    Temp 98.4 F (36.9 C)    Resp 16    Ht 5\' 11"  (1.803 m)    Wt 237 lb (107.5 kg)    SpO2 99%    BMI 33.05 kg/m    Physical Exam Vitals reviewed.  Constitutional:      General: She is not in acute distress.    Appearance: Normal appearance. She is obese. She is not ill-appearing.  HENT:     Head: Normocephalic and atraumatic.  Eyes:     Pupils: Pupils are equal, round, and reactive to  light.  Cardiovascular:     Rate and Rhythm: Normal rate and regular rhythm.  Pulmonary:     Effort: Pulmonary effort is normal. No respiratory distress.  Neurological:     Mental Status: She is alert and oriented to person, place, and time.  Psychiatric:        Mood and Affect: Mood is depressed. Affect is tearful.        Speech: Speech normal.        Behavior: Behavior is cooperative.        Thought Content: Thought content normal. Thought content does not include homicidal or suicidal ideation. Thought content does not include homicidal or suicidal plan.  Judgment: Judgment is not impulsive.       Assessment/Plan: 1. Vitamin D deficiency Encouraged patient to take OTC supplement. Level is low normal, without supplementation, she is at risk for the level decreasing and dropping further.   2. Mixed hyperlipidemia Encouraged patient to limit red meat in diet and increase lean proteins. Encouraged patient to take OTC omega-3 fish oil supplement.   3. Attention and concentration deficit Stable, not due for refills until late April.  4. Current moderate episode of major depressive disorder without prior episode ([place]) Will try sertraline, follow up in 1 month.  - sertraline (ZOLOFT) 50 MG tablet; Take 1 tablet (50 mg total) by mouth daily.  Dispense: 30 tablet; Refill: 3   General Counseling: Deanna Sims verbalizes understanding of the findings of todays visit and agrees with plan of treatment. I have discussed any further diagnostic evaluation that may be needed or ordered today. We also reviewed her medications today. she has been encouraged to call the office with any questions or concerns that should arise related to todays visit.    No orders of the defined types were placed in this encounter.   Meds ordered this encounter  Medications   sertraline (ZOLOFT) 50 MG tablet    Sig: Take 1 tablet (50 mg total) by mouth daily.    Dispense:  30 tablet    Refill:  3    Return  in 1 month (on 05/02/2021) for F/U, eval new med, [facility] PCP.   Total time spent:30 Minutes Time spent includes review of chart, medications, test results, and follow up plan with the patient.   [place] Controlled Substance Database was reviewed by me.  This patient was seen by Jonetta Osgood, FNP-C in collaboration with Dr. Clayborn Bigness as a part of collaborative care agreement.   [place]ake[place] R. Valetta Fuller, MSN, FNP-C Internal medicine

## 2021-04-19 DIAGNOSIS — Z309 Encounter for contraceptive management, unspecified: Secondary | ICD-10-CM | POA: Diagnosis not present

## 2021-04-19 DIAGNOSIS — Z1231 Encounter for screening mammogram for malignant neoplasm of breast: Secondary | ICD-10-CM | POA: Diagnosis not present

## 2021-04-19 DIAGNOSIS — Z6833 Body mass index (BMI) 33.0-33.9, adult: Secondary | ICD-10-CM | POA: Diagnosis not present

## 2021-04-19 DIAGNOSIS — Z124 Encounter for screening for malignant neoplasm of cervix: Secondary | ICD-10-CM | POA: Diagnosis not present

## 2021-04-19 DIAGNOSIS — Z01419 Encounter for gynecological examination (general) (routine) without abnormal findings: Secondary | ICD-10-CM | POA: Diagnosis not present

## 2021-04-23 ENCOUNTER — Encounter: Payer: Self-pay | Admitting: Nurse Practitioner

## 2021-04-27 ENCOUNTER — Other Ambulatory Visit: Payer: Self-pay | Admitting: Nurse Practitioner

## 2021-05-03 ENCOUNTER — Encounter: Payer: Self-pay | Admitting: Nurse Practitioner

## 2021-05-03 ENCOUNTER — Ambulatory Visit: Payer: BC Managed Care – PPO | Admitting: Nurse Practitioner

## 2021-05-03 ENCOUNTER — Other Ambulatory Visit: Payer: Self-pay

## 2021-05-03 VITALS — BP 125/75 | HR 69 | Temp 97.5°F | Resp 16 | Ht 71.0 in | Wt 233.0 lb

## 2021-05-03 DIAGNOSIS — R5383 Other fatigue: Secondary | ICD-10-CM

## 2021-05-03 DIAGNOSIS — E782 Mixed hyperlipidemia: Secondary | ICD-10-CM | POA: Diagnosis not present

## 2021-05-03 DIAGNOSIS — F321 Major depressive disorder, single episode, moderate: Secondary | ICD-10-CM | POA: Diagnosis not present

## 2021-05-03 DIAGNOSIS — E559 Vitamin D deficiency, unspecified: Secondary | ICD-10-CM | POA: Diagnosis not present

## 2021-05-03 NOTE — Progress Notes (Signed)
[facility] ?[address] ?[place], [place] [address] ? ?Internal MEDICINE  ?Office Visit Note ? ?Patient Name: Deanna Sims ? [id number]  ?[id number] ? ?Date of Service: 05/03/2021 ? ?Chief Complaint  ?Patient presents with  ? Follow-up  ? ? ?HPI ?Jesica presents for follow-up visit for depression and evaluation of new medication that was started at her previous office visit.  She was started on sertraline for depressive symptoms.  She reports that she feels less depressed and like her mood is more level and that she has had a little bit of trouble sleeping but that could be because her work is very hectic this month.  She reports that work is going to die down a bit next month and will not be as hectic so she will be able to see how well the medication is helping more so next month.  Overall she she states she likes how the sertraline makes her feel and wants to continue on the current dose. ?Her vital signs are normal and she comes back in May to follow-up about her ADHD medications. ? ? ? ? ?Current Medication: ?Outpatient Encounter Medications as of 05/03/2021  ?Medication Sig  ? amphetamine-dextroamphetamine (ADDERALL) 5 MG tablet Take one to two tablets daily as needed for ADHD.  ? amphetamine-dextroamphetamine (ADDERALL) 5 MG tablet Take one to two tablets daily as needed for ADHD.  ? [START ON 05/09/2021] amphetamine-dextroamphetamine (ADDERALL) 5 MG tablet Take one to two tablets daily as needed for ADHD.  ? cetirizine (ZYRTEC) 10 MG tablet Take 1 tablet (10 mg total) by mouth daily.  ? fluticasone (FLONASE) 50 MCG/ACT nasal spray Place 1 spray into both nostrils daily as needed for allergies or rhinitis.  ? ibuprofen (ADVIL) 600 MG tablet Take 1 tablet (600 mg total) by mouth every 6 (six) hours.  ? sertraline (ZOLOFT) 50 MG tablet TAKE 1 TABLET BY MOUTH EVERY DAY  ? SPRINTEC 28 0.25-35 MG-MCG tablet Take 1 tablet by mouth daily.  ? zolpidem (AMBIEN) 10 MG tablet Take one tab po qhs as needed, may  take one extra if needed  ? ?No facility-administered encounter medications on file as of 05/03/2021.  ? ? ?Surgical History: ?Past Surgical History:  ?Procedure Laterality Date  ? CESAREAN SECTION N/A 08/11/2015  ? Procedure: Primary CESAREAN SECTION;  Surgeon: Brien Few, MD;  Location: [facility];  Service: Obstetrics;  Laterality: N/A;  EDD: 08/17/15 ?Allergy: PCN  ? CESAREAN SECTION N/A 08/14/2017  ? Procedure: Repeat CESAREAN SECTION;  Surgeon: Brien Few, MD;  Location: [facility];  Service: Obstetrics;  Laterality: N/A;  EDD: 08/20/17 ?Allergy: Penicillin  ? DILATION AND EVACUATION N/A 03/15/2014  ? Procedure: DILATATION AND EVACUATION;  Surgeon: Lyman Speller, MD;  Location: [facility];  Service: Gynecology;  Laterality: N/A;  ? LAPAROSCOPY N/A 03/15/2014  ? Procedure: LAPAROSCOPY OPERATIVE;  Surgeon: Lyman Speller, MD;  Location: [facility];  Service: Gynecology;  Laterality: N/A;  ? WISDOM TOOTH EXTRACTION    ? ? ?Medical History: ?Past Medical History:  ?Diagnosis Date  ? ADHD   ? Bell palsy   ? Insomnia   ? Postpartum care following repeat cesarean delivery (6/29) 08/12/2015  ? Seasonal allergies   ? Sinusitis   ? ? ?Family History: ?Family History  ?Problem Relation Age of Onset  ? Uterine cancer Mother 56  ? Cancer - Ovarian Maternal Grandmother   ? Cancer - Lung Maternal Grandmother   ? Cancer - Lung Maternal Grandfather   ? ? ?  Social History  ? ?Socioeconomic History  ? Marital status: Married  ?  Spouse name: Not on file  ? Number of children: Not on file  ? Years of education: Not on file  ? Highest education level: Not on file  ?Occupational History  ? Not on file  ?Tobacco Use  ? Smoking status: Former  ?  Packs/day: 1.00  ?  Years: 13.00  ?  Pack years: 13.00  ?  Types: Cigarettes  ?  Quit date: 08/12/2013  ?  Years since quitting: 7.7  ? Smokeless tobacco: Never  ?Vaping Use  ? Vaping Use: Never used  ?Substance and Sexual Activity  ? Alcohol use: Yes  ?  Alcohol/week: 5.0  standard drinks  ?  Types: 5 Glasses of wine per week  ?  Comment: ocassionally  ? Drug use: No  ? Sexual activity: Yes  ?  Partners: Male  ?  Birth control/protection: None  ?  Comment: approx 7-[redacted] wks gestation?? per patient  ?Other Topics Concern  ? Not on file  ?Social History Narrative  ? Not on file  ? ?Social Determinants of Health  ? ?Financial Resource Strain: Not on file  ?Food Insecurity: Not on file  ?Transportation Needs: Not on file  ?Physical Activity: Not on file  ?Stress: Not on file  ?Social Connections: Not on file  ?Intimate Partner Violence: Not on file  ? ? ? ? ?Review of Systems  ?Constitutional:  Positive for fatigue. Negative for chills and unexpected weight change.  ?HENT:  Negative for congestion, rhinorrhea, sneezing and sore throat.   ?Eyes:  Negative for redness.  ?Respiratory:  Negative for cough, chest tightness and shortness of breath.   ?Cardiovascular:  Negative for chest pain and palpitations.  ?Gastrointestinal:  Negative for abdominal pain, constipation, diarrhea, nausea and vomiting.  ?Genitourinary:  Negative for dysuria and frequency.  ?Musculoskeletal:  Negative for arthralgias, back pain, joint swelling and neck pain.  ?Skin:  Negative for rash.  ?Neurological: Negative.  Negative for tremors and numbness.  ?Hematological:  Negative for adenopathy. Does not bruise/bleed easily.  ?Psychiatric/Behavioral:  Positive for behavioral problems (Depression--improving). Negative for sleep disturbance and suicidal ideas. The patient is not nervous/anxious.   ? ?Vital Signs: ?BP 125/75   Pulse 69   Temp (!) 97.5 ?F (36.4 ?C)   Resp 16   Ht '5\' 11"'$  (1.803 m)   Wt 233 lb (105.7 kg)   SpO2 96%   BMI 32.50 kg/m?  ? ? ?Physical Exam ?Vitals reviewed.  ?Constitutional:   ?   General: She is not in acute distress. ?   Appearance: Normal appearance. She is obese. She is not ill-appearing.  ?HENT:  ?   Head: Normocephalic and atraumatic.  ?Eyes:  ?   Pupils: Pupils are equal, round, and  reactive to light.  ?Cardiovascular:  ?   Rate and Rhythm: Normal rate and regular rhythm.  ?Pulmonary:  ?   Effort: Pulmonary effort is normal. No respiratory distress.  ?Neurological:  ?   Mental Status: She is alert and oriented to person, place, and time.  ?   Gait: Gait normal.  ?Psychiatric:     ?   Mood and Affect: Mood normal.     ?   Behavior: Behavior normal.  ? ? ? ? ? ?Assessment/Plan: ?1. Other fatigue ?Work life is very hectic which should be getting better soon which will improve her fatigue. ? ?2. Vitamin D deficiency ?Continuing OTC supplement ? ?3. Mixed hyperlipidemia ?Working  on diet modifications ? ?4. Current moderate episode of major depressive disorder without prior episode ([place]) ?Sertraline has been helping to improve her mood and depressive symptoms. She would like to continue the medication at the current dose. Follow up in may. ? ? ?General Counseling: Zareth verbalizes understanding of the findings of todays visit and agrees with plan of treatment. I have discussed any further diagnostic evaluation that may be needed or ordered today. We also reviewed her medications today. she has been encouraged to call the office with any questions or concerns that should arise related to todays visit. ? ? ? ?No orders of the defined types were placed in this encounter. ? ? ?No orders of the defined types were placed in this encounter. ? ? ?Return in 6 weeks (on 06/13/2021) for previously scheduled, F/U, ADHD med check, Anxiety/depression, Jiovanna Frei PCP. ? ? ?Total time spent:30 Minutes ?Time spent includes review of chart, medications, test results, and follow up plan with the patient.  ? ?[place] Controlled Substance Database was reviewed by me. ? ?This patient was seen by Jonetta Osgood, FNP-C in collaboration with Dr. Clayborn Bigness as a part of collaborative care agreement. ? ? ?Burdett Pinzon R. Valetta Fuller, MSN, FNP-C ?Internal medicine  ?

## 2021-05-18 DIAGNOSIS — J3489 Other specified disorders of nose and nasal sinuses: Secondary | ICD-10-CM | POA: Diagnosis not present

## 2021-05-18 DIAGNOSIS — J301 Allergic rhinitis due to pollen: Secondary | ICD-10-CM | POA: Diagnosis not present

## 2021-06-13 ENCOUNTER — Encounter: Payer: Self-pay | Admitting: Nurse Practitioner

## 2021-06-13 ENCOUNTER — Ambulatory Visit: Payer: BC Managed Care – PPO | Admitting: Nurse Practitioner

## 2021-06-13 VITALS — BP 130/86 | HR 68 | Temp 98.4°F | Resp 16 | Ht 71.0 in | Wt 231.8 lb

## 2021-06-13 DIAGNOSIS — R5383 Other fatigue: Secondary | ICD-10-CM

## 2021-06-13 DIAGNOSIS — F321 Major depressive disorder, single episode, moderate: Secondary | ICD-10-CM | POA: Diagnosis not present

## 2021-06-13 DIAGNOSIS — F5101 Primary insomnia: Secondary | ICD-10-CM

## 2021-06-13 DIAGNOSIS — R41840 Attention and concentration deficit: Secondary | ICD-10-CM

## 2021-06-13 MED ORDER — AMPHETAMINE-DEXTROAMPHETAMINE 5 MG PO TABS: ORAL_TABLET | ORAL | 0 refills | Status: DC

## 2021-06-13 MED ORDER — ZOLPIDEM TARTRATE 10 MG PO TABS: ORAL_TABLET | ORAL | 2 refills | Status: DC

## 2021-06-13 NOTE — Progress Notes (Signed)
[facility] ?[address] ?[place], [place] [address] ? ?Internal MEDICINE  ?Office Visit Note ? ?Patient Name: Deanna Sims ? [id number]  ?[id number] ? ?Date of Service: 06/13/2021 ? ?Chief Complaint  ?Patient presents with  ? Follow-up  ? Anxiety  ? Depression  ? ADHD  ? Medication Refill  ? ? ?HPI ?Deanna Sims presents for a follow up visit for ADHD, anxiety, weight loss, depression and medication refills. She reports that the current dose of adderall remains effective. She denies any palpitations or other adverse side effects of adderall. Her heart rate and blood pressure are stable and within normal limits. She has lost 6 lbs since February and has been trying to eat healthier.  ?She was recently started on zoloft and she reports that the medication is working well. Depressive symptoms are well controlled.  ?She is also in need of ambien refills.  ? ? ? ?Current Medication: ?Outpatient Encounter Medications as of 06/13/2021  ?Medication Sig  ? cetirizine (ZYRTEC) 10 MG tablet Take 1 tablet (10 mg total) by mouth daily.  ? fluticasone (FLONASE) 50 MCG/ACT nasal spray Place 1 spray into both nostrils daily as needed for allergies or rhinitis.  ? ibuprofen (ADVIL) 600 MG tablet Take 1 tablet (600 mg total) by mouth every 6 (six) hours.  ? sertraline (ZOLOFT) 50 MG tablet TAKE 1 TABLET BY MOUTH EVERY DAY  ? SPRINTEC 28 0.25-35 MG-MCG tablet Take 1 tablet by mouth daily.  ? [DISCONTINUED] amphetamine-dextroamphetamine (ADDERALL) 5 MG tablet Take one to two tablets daily as needed for ADHD.  ? [DISCONTINUED] amphetamine-dextroamphetamine (ADDERALL) 5 MG tablet Take one to two tablets daily as needed for ADHD.  ? [DISCONTINUED] amphetamine-dextroamphetamine (ADDERALL) 5 MG tablet Take one to two tablets daily as needed for ADHD.  ? [DISCONTINUED] zolpidem (AMBIEN) 10 MG tablet Take one tab po qhs as needed, may take one extra if needed  ? amphetamine-dextroamphetamine (ADDERALL) 5 MG tablet Take one to two tablets  daily as needed for ADHD.  ? [START ON 07/11/2021] amphetamine-dextroamphetamine (ADDERALL) 5 MG tablet Take one to two tablets daily as needed for ADHD.  ? [START ON 08/08/2021] amphetamine-dextroamphetamine (ADDERALL) 5 MG tablet Take one to two tablets daily as needed for ADHD.  ? zolpidem (AMBIEN) 10 MG tablet Take one tab po qhs as needed, may take one extra if needed  ? ?No facility-administered encounter medications on file as of 06/13/2021.  ? ? ?Surgical History: ?Past Surgical History:  ?Procedure Laterality Date  ? CESAREAN SECTION N/A 08/11/2015  ? Procedure: Primary CESAREAN SECTION;  Surgeon: Brien Few, MD;  Location: [facility];  Service: Obstetrics;  Laterality: N/A;  EDD: 08/17/15 ?Allergy: PCN  ? CESAREAN SECTION N/A 08/14/2017  ? Procedure: Repeat CESAREAN SECTION;  Surgeon: Brien Few, MD;  Location: [facility];  Service: Obstetrics;  Laterality: N/A;  EDD: 08/20/17 ?Allergy: Penicillin  ? DILATION AND EVACUATION N/A 03/15/2014  ? Procedure: DILATATION AND EVACUATION;  Surgeon: Lyman Speller, MD;  Location: [facility];  Service: Gynecology;  Laterality: N/A;  ? LAPAROSCOPY N/A 03/15/2014  ? Procedure: LAPAROSCOPY OPERATIVE;  Surgeon: Lyman Speller, MD;  Location: [facility];  Service: Gynecology;  Laterality: N/A;  ? WISDOM TOOTH EXTRACTION    ? ? ?Medical History: ?Past Medical History:  ?Diagnosis Date  ? ADHD   ? Bell palsy   ? Insomnia   ? Postpartum care following repeat cesarean delivery (6/29) 08/12/2015  ? Seasonal allergies   ? Sinusitis   ? ? ?Family  History: ?Family History  ?Problem Relation Age of Onset  ? Uterine cancer Mother 50  ? Cancer - Ovarian Maternal Grandmother   ? Cancer - Lung Maternal Grandmother   ? Cancer - Lung Maternal Grandfather   ? ? ?Social History  ? ?Socioeconomic History  ? Marital status: Married  ?  Spouse name: Not on file  ? Number of children: Not on file  ? Years of education: Not on file  ? Highest education level: Not on file   ?Occupational History  ? Not on file  ?Tobacco Use  ? Smoking status: Former  ?  Packs/day: 1.00  ?  Years: 13.00  ?  Pack years: 13.00  ?  Types: Cigarettes  ?  Quit date: 08/12/2013  ?  Years since quitting: 7.8  ? Smokeless tobacco: Never  ?Vaping Use  ? Vaping Use: Never used  ?Substance and Sexual Activity  ? Alcohol use: Yes  ?  Alcohol/week: 5.0 standard drinks  ?  Types: 5 Glasses of wine per week  ?  Comment: ocassionally  ? Drug use: No  ? Sexual activity: Yes  ?  Partners: Male  ?  Birth control/protection: None  ?  Comment: approx 7-[redacted] wks gestation?? per patient  ?Other Topics Concern  ? Not on file  ?Social History Narrative  ? Not on file  ? ?Social Determinants of Health  ? ?Financial Resource Strain: Not on file  ?Food Insecurity: Not on file  ?Transportation Needs: Not on file  ?Physical Activity: Not on file  ?Stress: Not on file  ?Social Connections: Not on file  ?Intimate Partner Violence: Not on file  ? ? ? ? ?Review of Systems  ?Constitutional:  Negative for chills, fatigue and unexpected weight change.  ?HENT:  Negative for congestion, rhinorrhea, sneezing and sore throat.   ?Eyes:  Negative for redness.  ?Respiratory:  Negative for cough, chest tightness and shortness of breath.   ?Cardiovascular:  Negative for chest pain and palpitations.  ?Gastrointestinal:  Negative for abdominal pain, constipation, diarrhea, nausea and vomiting.  ?Genitourinary:  Negative for dysuria and frequency.  ?Musculoskeletal:  Negative for arthralgias, back pain, joint swelling and neck pain.  ?Skin:  Negative for rash.  ?Neurological: Negative.  Negative for tremors and numbness.  ?Hematological:  Negative for adenopathy. Does not bruise/bleed easily.  ?Psychiatric/Behavioral:  Negative for behavioral problems (Depression), sleep disturbance and suicidal ideas. The patient is not nervous/anxious.   ? ?Vital Signs: ?BP 130/86   Pulse 68   Temp 98.4 ?F (36.9 ?C)   Resp 16   Ht '5\' 11"'$  (1.803 m)   Wt 231 lb 12.8  oz (105.1 kg)   SpO2 99%   BMI 32.33 kg/m?  ? ? ?Physical Exam ?Vitals reviewed.  ?Constitutional:   ?   General: She is not in acute distress. ?   Appearance: Normal appearance. She is obese. She is not ill-appearing.  ?HENT:  ?   Head: Normocephalic and atraumatic.  ?Eyes:  ?   Pupils: Pupils are equal, round, and reactive to light.  ?Cardiovascular:  ?   Rate and Rhythm: Normal rate and regular rhythm.  ?Pulmonary:  ?   Effort: Pulmonary effort is normal. No respiratory distress.  ?Neurological:  ?   Mental Status: She is alert and oriented to person, place, and time.  ?   Gait: Gait normal.  ?Psychiatric:     ?   Mood and Affect: Mood normal.     ?   Behavior: Behavior normal.  ? ? ? ? ? ?  Assessment/Plan: ?1. 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 She also works and takes care of her kids full time. Encouraged patient to take time for herself when she is able and to rest.  ? ?2. Attention and concentration deficit ?Current dose is effective, no issues, 3 months of refills ordered. Follow up in 3 months for future refills.  ?- amphetamine-dextroamphetamine (ADDERALL) 5 MG tablet; Take one to two tablets daily as needed for ADHD.  Dispense: 60 tablet; Refill: 0 ?- amphetamine-dextroamphetamine (ADDERALL) 5 MG tablet; Take one to two tablets daily as needed for ADHD.  Dispense: 60 tablet; Refill: 0 ?- amphetamine-dextroamphetamine (ADDERALL) 5 MG tablet; Take one to two tablets daily as needed for ADHD.  Dispense: 60 tablet; Refill: 0 ? ?3. Primary insomnia ?Stable, continue ambien as prescribed, refills ordered. Follow up in 3 months for additional refills.  ?- zolpidem (AMBIEN) 10 MG tablet; Take one tab po qhs as needed, may take one extra if needed  Dispense: 30 tablet; Refill: 2 ? ?4. Current moderate episode of major depressive disorder without  prior episode ([place]) ?Zoloft has been helping, discussed possible increase in dose if she feels like she needs to increase then she can take an additional 1/2 tablet daily. Patient will call the clinic and let me know if she

## 2021-07-25 ENCOUNTER — Ambulatory Visit: Payer: BC Managed Care – PPO | Admitting: Nurse Practitioner

## 2021-07-25 ENCOUNTER — Encounter: Payer: Self-pay | Admitting: Nurse Practitioner

## 2021-07-25 VITALS — BP 125/60 | HR 68 | Temp 98.0°F | Resp 16 | Ht 70.0 in | Wt 233.0 lb

## 2021-07-25 DIAGNOSIS — F321 Major depressive disorder, single episode, moderate: Secondary | ICD-10-CM

## 2021-07-25 DIAGNOSIS — R5383 Other fatigue: Secondary | ICD-10-CM

## 2021-07-25 DIAGNOSIS — Z6833 Body mass index (BMI) 33.0-33.9, adult: Secondary | ICD-10-CM | POA: Diagnosis not present

## 2021-07-25 DIAGNOSIS — E6609 Other obesity due to excess calories: Secondary | ICD-10-CM | POA: Diagnosis not present

## 2021-07-25 DIAGNOSIS — N951 Menopausal and female climacteric states: Secondary | ICD-10-CM

## 2021-07-25 DIAGNOSIS — R41840 Attention and concentration deficit: Secondary | ICD-10-CM

## 2021-07-25 NOTE — Progress Notes (Signed)
[facility] [place], [place] [address]  Internal MEDICINE  Office Visit Note  Patient Name: Deanna Sims  [id number]  [id number]  Date of Service: 07/25/2021  Chief Complaint  Patient presents with   Follow-up    Discuss meds    Weight Loss    HPI Deanna Sims presents for a follow visit for weight loss counseling and medication review. She performed a metabolic test today.  This was discussed along with sample meal plans and diet and lifestyle modifications.  Wants to try ozempic She reports zoloft is still helping to control her depressive symptoms and anxiety.  Adderall remains effective and is improving focus and attention/concentration.   Still has issues with fatigue but is optimistic that improving her diet and physical activity and losing some weight will help improve her energy levels.     Current Medication: Outpatient Encounter Medications as of 07/25/2021  Medication Sig   cetirizine (ZYRTEC) 10 MG tablet Take 1 tablet (10 mg total) by mouth daily.   fluticasone (FLONASE) 50 MCG/ACT nasal spray Place 1 spray into both nostrils daily as needed for allergies or rhinitis.   ibuprofen (ADVIL) 600 MG tablet Take 1 tablet (600 mg total) by mouth every 6 (six) hours.   SPRINTEC 28 0.25-35 MG-MCG tablet Take 1 tablet by mouth daily.   [DISCONTINUED] amphetamine-dextroamphetamine (ADDERALL) 5 MG tablet Take one to two tablets daily as needed for ADHD.   [DISCONTINUED] amphetamine-dextroamphetamine (ADDERALL) 5 MG tablet Take one to two tablets daily as needed for ADHD.   [DISCONTINUED] amphetamine-dextroamphetamine (ADDERALL) 5 MG tablet Take one to two tablets daily as needed for ADHD.   [DISCONTINUED] sertraline (ZOLOFT) 50 MG tablet TAKE 1 TABLET BY MOUTH EVERY DAY   [DISCONTINUED] zolpidem (AMBIEN) 10 MG tablet Take one tab po qhs as needed, may take one extra if needed   No facility-administered encounter medications on file as of 07/25/2021.     Surgical History: Past Surgical History:  Procedure Laterality Date   CESAREAN SECTION N/A 08/11/2015   Procedure: Primary CESAREAN SECTION;  Surgeon: Brien Few, MD;  Location: [facility];  Service: Obstetrics;  Laterality: N/A;  EDD: 08/17/15 Allergy: PCN   CESAREAN SECTION N/A 08/14/2017   Procedure: Repeat CESAREAN SECTION;  Surgeon: Brien Few, MD;  Location: [place];  Service: Obstetrics;  Laterality: N/A;  EDD: 08/20/17 Allergy: Penicillin   DILATION AND EVACUATION N/A 03/15/2014   Procedure: DILATATION AND EVACUATION;  Surgeon: Lyman Speller, MD;  Location: [facility];  Service: Gynecology;  Laterality: N/A;   LAPAROSCOPY N/A 03/15/2014   Procedure: LAPAROSCOPY OPERATIVE;  Surgeon: Lyman Speller, MD;  Location: [facility];  Service: Gynecology;  Laterality: N/A;   WISDOM TOOTH EXTRACTION      Medical History: Past Medical History:  Diagnosis Date   ADHD    Bell palsy    Insomnia    Postpartum care following repeat cesarean delivery (6/29) 08/12/2015   Seasonal allergies    Sinusitis     Family History: Family History  Problem Relation Age of Onset   Uterine cancer Mother 9   Cancer - Ovarian Maternal Grandmother    Cancer - Lung Maternal Grandmother    Cancer - Lung Maternal Grandfather     Social History   Socioeconomic History   Marital status: Married    Spouse name: Not on file   Number of children: Not on file   Years of education: Not on file   Highest education level: Not on  file  Occupational History   Not on file  Tobacco Use   Smoking status: Former    Packs/day: 1.00    Years: 13.00    Total pack years: 13.00    Types: Cigarettes    Quit date: 08/12/2013    Years since quitting: 8.0   Smokeless tobacco: Never  Vaping Use   Vaping Use: Never used  Substance and Sexual Activity   Alcohol use: Yes    Alcohol/week: 5.0 standard drinks of alcohol    Types: 5 Glasses of wine per week    Comment: ocassionally   Drug  use: No   Sexual activity: Yes    Partners: Male    Birth control/protection: None    Comment: approx 7-[redacted] wks gestation?? per patient  Other Topics Concern   Not on file  Social History Narrative   Not on file   Social Determinants of Health   Financial Resource Strain: Not on file  Food Insecurity: Not on file  Transportation Needs: Not on file  Physical Activity: Not on file  Stress: Not on file  Social Connections: Not on file  Intimate Partner Violence: Not on file      Review of Systems  Constitutional:  Negative for chills, fatigue and unexpected weight change.  HENT:  Negative for congestion, rhinorrhea, sneezing and sore throat.   Eyes:  Negative for redness.  Respiratory:  Negative for cough, chest tightness and shortness of breath.   Cardiovascular:  Negative for chest pain and palpitations.  Gastrointestinal:  Negative for abdominal pain, constipation, diarrhea, nausea and vomiting.  Genitourinary:  Negative for dysuria and frequency.  Musculoskeletal:  Negative for arthralgias, back pain, joint swelling and neck pain.  Skin:  Negative for rash.  Neurological: Negative.  Negative for tremors and numbness.  Hematological:  Negative for adenopathy. Does not bruise/bleed easily.  Psychiatric/Behavioral:  Negative for behavioral problems (Depression), sleep disturbance and suicidal ideas. The patient is not nervous/anxious.     Vital Signs: BP 125/60   Pulse 68   Temp 98 F (36.7 C)   Resp 16   Ht '5\' 10"'$  (1.778 m)   Wt 233 lb (105.7 kg)   SpO2 98%   BMI 33.43 kg/m    Physical Exam Vitals reviewed.  Constitutional:      General: She is not in acute distress.    Appearance: Normal appearance. She is obese. She is not ill-appearing.  HENT:     Head: Normocephalic and atraumatic.  Eyes:     Pupils: Pupils are equal, round, and reactive to light.  Cardiovascular:     Rate and Rhythm: Normal rate and regular rhythm.  Pulmonary:     Effort: Pulmonary  effort is normal. No respiratory distress.  Neurological:     Mental Status: She is alert and oriented to person, place, and time.     Gait: Gait normal.  Psychiatric:        Mood and Affect: Mood normal.        Behavior: Behavior normal.        Assessment/Plan: 1. Other fatigue Working on improving diet and lifestyle to help her lose weight which may improve her energy levels and lessen her fatigue.   2. Perimenopause Zoloft is helping some with perimenopausal symptoms.   3. Attention and concentration deficit Adderall remains effective, no refills needed at this time.   4. Class 1 obesity due to excess calories without serious comorbidity with body mass index (BMI) of 33.0 to 33.9 in  adult Metabolic test calculated basal metabolic rate and her daily calorie intake to promote weight loss should be 1809-2261 calories per day. Sample meals plans for 1800 and 2000 calorie diet were provided to the patient. Ozempic sample provided, will follow up in august.  Obesity Counseling: Risk Assessment: An assessment of behavioral risk factors was made today and includes lack of exercise sedentary lifestyle, lack of portion control and poor dietary habits. The patient has been screened for diabetes and a metabolic test to determine basal metabolic rate has been performed.   Risk Modification Advice: The patient was counseled on portion control guidelines. Safe recommendations on caloric restriction discussed with patient. Sample meals plans were provided. General guidelines on diet and lifestyle modifications were discussed at length. Introducing physical activity as tolerated and at the patient's ability level is recommended.  - Metabolic Test  5. Current moderate episode of major depressive disorder without prior episode ([place]) Depressive symptoms have improved, anxiety level is lower.    General Counseling: Bena verbalizes understanding of the findings of todays visit and agrees with plan  of treatment. I have discussed any further diagnostic evaluation that may be needed or ordered today. We also reviewed her medications today. she has been encouraged to call the office with any questions or concerns that should arise related to todays visit.    Orders Placed This Encounter  Procedures   Metabolic Test    No orders of the defined types were placed in this encounter.   Return in about 7 weeks (around 09/12/2021) for F/U, Weight loss, ADHD med check, Tagg Eustice PCP.   Total time spent:30 Minutes Time spent includes review of chart, medications, test results, and follow up plan with the patient.   [place] Controlled Substance Database was reviewed by me.  This patient was seen by Jonetta Osgood, FNP-C in collaboration with Dr. Clayborn Bigness as a part of collaborative care agreement.   Josie Mesa R. Valetta Fuller, MSN, FNP-C Internal medicine

## 2021-07-26 ENCOUNTER — Encounter: Payer: Self-pay | Admitting: Nurse Practitioner

## 2021-08-10 ENCOUNTER — Other Ambulatory Visit: Payer: Self-pay | Admitting: Nurse Practitioner

## 2021-08-10 DIAGNOSIS — R41840 Attention and concentration deficit: Secondary | ICD-10-CM

## 2021-08-10 MED ORDER — AMPHETAMINE-DEXTROAMPHETAMINE 5 MG PO TABS: ORAL_TABLET | ORAL | 0 refills | Status: DC

## 2021-09-07 ENCOUNTER — Other Ambulatory Visit: Payer: Self-pay

## 2021-09-07 MED ORDER — ONDANSETRON HCL 4 MG PO TABS: 4.0000 mg | ORAL_TABLET | Freq: Three times a day (TID) | ORAL | 1 refills | Status: DC | PRN

## 2021-09-07 NOTE — Telephone Encounter (Signed)
Pt called c/o nausea, fatigue, burning in top center of stomach.  Pt using pepto and helps a little.  Pt was given sample of OZEMPIC on 07/25/21 and just moved up to .50 dose on Monday 724/23.  Per Alyssa those are normal side effects and she can try the Zofran and also OTC Prilosec/famotidine 20 mg.  Advised pt.

## 2021-09-12 ENCOUNTER — Other Ambulatory Visit: Payer: Self-pay

## 2021-09-12 ENCOUNTER — Ambulatory Visit: Payer: BC Managed Care – PPO | Admitting: Nurse Practitioner

## 2021-09-12 ENCOUNTER — Encounter: Payer: Self-pay | Admitting: Nurse Practitioner

## 2021-09-12 VITALS — BP 129/81 | HR 69 | Temp 98.0°F | Resp 16 | Ht 70.0 in | Wt 232.6 lb

## 2021-09-12 DIAGNOSIS — E6609 Other obesity due to excess calories: Secondary | ICD-10-CM | POA: Diagnosis not present

## 2021-09-12 DIAGNOSIS — N951 Menopausal and female climacteric states: Secondary | ICD-10-CM

## 2021-09-12 DIAGNOSIS — Z79899 Other long term (current) drug therapy: Secondary | ICD-10-CM | POA: Diagnosis not present

## 2021-09-12 DIAGNOSIS — F321 Major depressive disorder, single episode, moderate: Secondary | ICD-10-CM

## 2021-09-12 DIAGNOSIS — F5101 Primary insomnia: Secondary | ICD-10-CM

## 2021-09-12 DIAGNOSIS — Z6833 Body mass index (BMI) 33.0-33.9, adult: Secondary | ICD-10-CM

## 2021-09-12 DIAGNOSIS — R41840 Attention and concentration deficit: Secondary | ICD-10-CM

## 2021-09-12 LAB — POCT URINE DRUG SCREEN
Methylenedioxyamphetamine: NOT DETECTED
POC Amphetamine UR: POSITIVE — AB
POC BENZODIAZEPINES UR: NOT DETECTED
POC Barbiturate UR: NOT DETECTED
POC Cocaine UR: NOT DETECTED
POC Ecstasy UR: NOT DETECTED
POC Marijuana UR: NOT DETECTED
POC Methadone UR: NOT DETECTED
POC Methamphetamine UR: NOT DETECTED
POC Opiate Ur: NOT DETECTED
POC Oxycodone UR: NOT DETECTED
POC PHENCYCLIDINE UR: NOT DETECTED
POC TRICYCLICS UR: NOT DETECTED

## 2021-09-12 MED ORDER — SEMAGLUTIDE-WEIGHT MANAGEMENT 0.5 MG/0.5ML ~~LOC~~ SOAJ: 0.5000 mg | SUBCUTANEOUS | 2 refills | Status: DC

## 2021-09-12 MED ORDER — SERTRALINE HCL 50 MG PO TABS: 50.0000 mg | ORAL_TABLET | Freq: Every day | ORAL | 2 refills | Status: DC

## 2021-09-12 MED ORDER — AMPHETAMINE-DEXTROAMPHETAMINE 5 MG PO TABS: ORAL_TABLET | ORAL | 0 refills | Status: DC

## 2021-09-12 MED ORDER — ZOLPIDEM TARTRATE 10 MG PO TABS: ORAL_TABLET | ORAL | 2 refills | Status: DC

## 2021-09-12 MED ORDER — SEMAGLUTIDE-WEIGHT MANAGEMENT 0.5 MG/0.5ML ~~LOC~~ SOAJ
0.5000 mg | SUBCUTANEOUS | 2 refills | Status: DC
Start: 2021-09-12 — End: 2021-09-12

## 2021-09-12 NOTE — Progress Notes (Signed)
[facility] [place], [place] [address]  Internal MEDICINE  Office Visit Note  Patient Name: Deanna Sims  [id number]  [id number]  Date of Service: 09/12/2021  Chief Complaint  Patient presents with  . Follow-up  . ADHD    HPI Deanna Sims presents for a follow-up visit for ADHD, depression and weight loss management.  She feels better on the Adderall to control her ADHD symptoms but would like to increase the dose.  She was provided with a sample of Ozempic which she has noticed has helped some with her appetite as well as how her clothes are fitting but she has not lost any significant weight on the initial dose and possibly injected the first 1 or 2 doses incorrectly per patient report.  Due to the fact that the patient does not have diabetes, it may be difficult to get Ozempic approved by her insurance so she is interested in seeing if her insurance will cover [facility] for weight loss management.  [facility] uses [facility] but they may not have ability in stock so if they do not, we can send the prescription to a nearby [place]. --Current dose of Zoloft continues to work well for the patient per report, she feels more collected, denies any depressed thoughts or depressive symptoms and reports that her anxiety level is improved. --She is also due for refills of Ambien which is still working well to help her sleep at night. Increase adderall  wegovy   Current Medication: Outpatient Encounter Medications as of 09/12/2021  Medication Sig  . cetirizine (ZYRTEC) 10 MG tablet Take 1 tablet (10 mg total) by mouth daily.  . fluticasone (FLONASE) 50 MCG/ACT nasal spray Place 1 spray into both nostrils daily as needed for allergies or rhinitis.  Marland Kitchen ibuprofen (ADVIL) 600 MG tablet Take 1 tablet (600 mg total) by mouth every 6 (six) hours.  . ondansetron (ZOFRAN) 4 MG tablet Take 1 tablet (4 mg total) by mouth every 8 (eight) hours as needed for nausea or vomiting.  .  Semaglutide-Weight Management 0.5 MG/0.5ML SOAJ Inject 0.5 mg into the skin once a week.  . SPRINTEC 28 0.25-35 MG-MCG tablet Take 1 tablet by mouth daily.  . [DISCONTINUED] amphetamine-dextroamphetamine (ADDERALL) 5 MG tablet Take 2 tablets by mouth in the morning and take 1 tablet by mouth between noon and 2pm.  . [DISCONTINUED] amphetamine-dextroamphetamine (ADDERALL) 5 MG tablet Take 2 tablets by mouth in the morning and take 1 tablet by mouth between noon and 2pm.  . [DISCONTINUED] sertraline (ZOLOFT) 50 MG tablet TAKE 1 TABLET BY MOUTH EVERY DAY  . [DISCONTINUED] zolpidem (AMBIEN) 10 MG tablet Take one tab po qhs as needed, may take one extra if needed  . amphetamine-dextroamphetamine (ADDERALL) 5 MG tablet Take 2 tablets by mouth in the morning and take 1 tablet by mouth between noon and 2pm.  . [START ON 10/10/2021] amphetamine-dextroamphetamine (ADDERALL) 5 MG tablet Take 2 tablets by mouth in the morning and take 1 tablet by mouth between noon and 2pm.  . [START ON 11/07/2021] amphetamine-dextroamphetamine (ADDERALL) 5 MG tablet Take 2 tablets by mouth in the morning and take 1 tablet by mouth between noon and 2pm.  . sertraline (ZOLOFT) 50 MG tablet Take 1 tablet (50 mg total) by mouth daily.  Marland Kitchen zolpidem (AMBIEN) 10 MG tablet Take one tab po qhs as needed, may take one extra if needed   No facility-administered encounter medications on file as of 09/12/2021.    Surgical History: Past Surgical  History:  Procedure Laterality Date  . CESAREAN SECTION N/A 08/11/2015   Procedure: Primary CESAREAN SECTION;  Surgeon: Brien Few, MD;  Location: [facility];  Service: Obstetrics;  Laterality: N/A;  EDD: 08/17/15 Allergy: PCN  . CESAREAN SECTION N/A 08/14/2017   Procedure: Repeat CESAREAN SECTION;  Surgeon: Brien Few, MD;  Location: [place];  Service: Obstetrics;  Laterality: N/A;  EDD: 08/20/17 Allergy: Penicillin  . DILATION AND EVACUATION N/A 03/15/2014   Procedure:  DILATATION AND EVACUATION;  Surgeon: Lyman Speller, MD;  Location: [facility];  Service: Gynecology;  Laterality: N/A;  . LAPAROSCOPY N/A 03/15/2014   Procedure: LAPAROSCOPY OPERATIVE;  Surgeon: Lyman Speller, MD;  Location: [facility];  Service: Gynecology;  Laterality: N/A;  . WISDOM TOOTH EXTRACTION      Medical History: Past Medical History:  Diagnosis Date  . ADHD   . Bell palsy   . Insomnia   . Postpartum care following repeat cesarean delivery (6/29) 08/12/2015  . Seasonal allergies   . Sinusitis     Family History: Family History  Problem Relation Age of Onset  . Uterine cancer Mother 58  . Cancer - Ovarian Maternal Grandmother   . Cancer - Lung Maternal Grandmother   . Cancer - Lung Maternal Grandfather     Social History   Socioeconomic History  . Marital status: Married    Spouse name: Not on file  . Number of children: Not on file  . Years of education: Not on file  . Highest education level: Not on file  Occupational History  . Not on file  Tobacco Use  . Smoking status: Former    Packs/day: 1.00    Years: 13.00    Total pack years: 13.00    Types: Cigarettes    Quit date: 08/12/2013    Years since quitting: 8.0  . Smokeless tobacco: Never  Vaping Use  . Vaping Use: Never used  Substance and Sexual Activity  . Alcohol use: Yes    Alcohol/week: 5.0 standard drinks of alcohol    Types: 5 Glasses of wine per week    Comment: ocassionally  . Drug use: No  . Sexual activity: Yes    Partners: Male    Birth control/protection: None    Comment: approx 7-[redacted] wks gestation?? per patient  Other Topics Concern  . Not on file  Social History Narrative  . Not on file   Social Determinants of Health   Financial Resource Strain: Not on file  Food Insecurity: Not on file  Transportation Needs: Not on file  Physical Activity: Not on file  Stress: Not on file  Social Connections: Not on file  Intimate Partner Violence: Not on file      Review of  Systems  Vital Signs: BP 129/81   Pulse 69   Temp 98 F (36.7 C)   Resp 16   Ht '5\' 10"'$  (1.778 m)   Wt 232 lb 9.6 oz (105.5 kg)   SpO2 98%   BMI 33.37 kg/m    Physical Exam     Assessment/Plan:   General Counseling: Deanna Sims verbalizes understanding of the findings of todays visit and agrees with plan of treatment. I have discussed any further diagnostic evaluation that may be needed or ordered today. We also reviewed her medications today. she has been encouraged to call the office with any questions or concerns that should arise related to todays visit.    Orders Placed This Encounter  Procedures  . POCT Urine  Drug Screen    Meds ordered this encounter  Medications  . sertraline (ZOLOFT) 50 MG tablet    Sig: Take 1 tablet (50 mg total) by mouth daily.    Dispense:  90 tablet    Refill:  2  . amphetamine-dextroamphetamine (ADDERALL) 5 MG tablet    Sig: Take 2 tablets by mouth in the morning and take 1 tablet by mouth between noon and 2pm.    Dispense:  90 tablet    Refill:  0    Fill for 09/12/21  . zolpidem (AMBIEN) 10 MG tablet    Sig: Take one tab po qhs as needed, may take one extra if needed    Dispense:  30 tablet    Refill:  2  . amphetamine-dextroamphetamine (ADDERALL) 5 MG tablet    Sig: Take 2 tablets by mouth in the morning and take 1 tablet by mouth between noon and 2pm.    Dispense:  90 tablet    Refill:  0    Fill for 10/10/21  . amphetamine-dextroamphetamine (ADDERALL) 5 MG tablet    Sig: Take 2 tablets by mouth in the morning and take 1 tablet by mouth between noon and 2pm.    Dispense:  90 tablet    Refill:  0    Fill for 11/07/21  . Semaglutide-Weight Management 0.5 MG/0.5ML SOAJ    Sig: Inject 0.5 mg into the skin once a week.    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   Total time spent:*** Minutes Time spent includes review of chart,  medications, test results, and follow up plan with the patient.   [place] Controlled Substance Database was reviewed by me.  This patient was seen by Jonetta Osgood, FNP-C in collaboration with Dr. Clayborn Bigness as a part of collaborative care agreement.   Deanna Sims R. Valetta Fuller, MSN, FNP-C Internal medicine

## 2021-09-13 ENCOUNTER — Encounter: Payer: Self-pay | Admitting: Nurse Practitioner

## 2021-09-13 ENCOUNTER — Telehealth: Payer: Self-pay

## 2021-09-13 ENCOUNTER — Other Ambulatory Visit: Payer: Self-pay

## 2021-09-13 MED ORDER — SEMAGLUTIDE-WEIGHT MANAGEMENT 0.5 MG/0.5ML ~~LOC~~ SOAJ: 0.5000 mg | SUBCUTANEOUS | 2 refills | Status: DC

## 2021-09-13 NOTE — Telephone Encounter (Signed)
error 

## 2021-09-13 NOTE — Telephone Encounter (Addendum)
PA for WEGOVY 0.5 mg sent 09/13/21 @ 4: 25 pm.  09/13/21 PA was DENIED for [facility] as it is not a covered benefit per your benefit booklet or plan

## 2021-09-14 ENCOUNTER — Telehealth: Payer: Self-pay

## 2021-09-15 ENCOUNTER — Encounter: Payer: Self-pay | Admitting: Nurse Practitioner

## 2021-09-20 ENCOUNTER — Telehealth: Payer: Self-pay

## 2021-09-20 NOTE — Telephone Encounter (Signed)
Lvm to schedule wt loss appointment with [email or web address]-Toni

## 2021-09-21 NOTE — Telephone Encounter (Signed)
Pt schedule with [facility] 09/25/21

## 2021-09-25 ENCOUNTER — Ambulatory Visit (INDEPENDENT_AMBULATORY_CARE_PROVIDER_SITE_OTHER): Payer: BC Managed Care – PPO | Admitting: Internal Medicine

## 2021-09-25 ENCOUNTER — Encounter: Payer: Self-pay | Admitting: Internal Medicine

## 2021-09-25 VITALS — BP 108/80 | HR 81 | Temp 98.3°F | Resp 16 | Ht 71.0 in | Wt 231.6 lb

## 2021-09-25 DIAGNOSIS — F5081 Binge eating disorder: Secondary | ICD-10-CM

## 2021-09-25 DIAGNOSIS — E6609 Other obesity due to excess calories: Secondary | ICD-10-CM

## 2021-09-25 DIAGNOSIS — Z6833 Body mass index (BMI) 33.0-33.9, adult: Secondary | ICD-10-CM | POA: Diagnosis not present

## 2021-09-25 DIAGNOSIS — F19982 Other psychoactive substance use, unspecified with psychoactive substance-induced sleep disorder: Secondary | ICD-10-CM

## 2021-09-25 MED ORDER — LISDEXAMFETAMINE DIMESYLATE 20 MG PO CAPS: 20.0000 mg | ORAL_CAPSULE | Freq: Every day | ORAL | 0 refills | Status: DC

## 2021-09-25 NOTE — Progress Notes (Signed)
[facility] [place], [place] [address]  Internal MEDICINE  Office Visit Note  Patient Name: Michon Kaczmarek  [id number]  [id number]  Date of Service: 09/25/2021  Chief Complaint  Patient presents with   Weight Loss    HPI Pt is here for wt loss management  She is suffering from obesity since her kids were born ( 70, 16 years now). Detail history is taken including types of food, amount of food and meal prep  She is difficulty in portion control, does eat more when stressed, has ADD  She is active however does not count her steps  She was given Wagovey samples and showed improvement in control over her serving size  Binge eating present per criteria   Current Medication: Outpatient Encounter Medications as of 09/25/2021  Medication Sig   amphetamine-dextroamphetamine (ADDERALL) 5 MG tablet Take 2 tablets by mouth in the morning and take 1 tablet by mouth between noon and 2pm.   [START ON 10/10/2021] amphetamine-dextroamphetamine (ADDERALL) 5 MG tablet Take 2 tablets by mouth in the morning and take 1 tablet by mouth between noon and 2pm.   [START ON 11/07/2021] amphetamine-dextroamphetamine (ADDERALL) 5 MG tablet Take 2 tablets by mouth in the morning and take 1 tablet by mouth between noon and 2pm.   cetirizine (ZYRTEC) 10 MG tablet Take 1 tablet (10 mg total) by mouth daily.   fluticasone (FLONASE) 50 MCG/ACT nasal spray Place 1 spray into both nostrils daily as needed for allergies or rhinitis.   ibuprofen (ADVIL) 600 MG tablet Take 1 tablet (600 mg total) by mouth every 6 (six) hours.   lisdexamfetamine (VYVANSE) 20 MG capsule Take 1 capsule (20 mg total) by mouth daily.   ondansetron (ZOFRAN) 4 MG tablet Take 1 tablet (4 mg total) by mouth every 8 (eight) hours as needed for nausea or vomiting.   sertraline (ZOLOFT) 50 MG tablet Take 1 tablet (50 mg total) by mouth daily.   SPRINTEC 28 0.25-35 MG-MCG tablet Take 1 tablet by mouth daily.   zolpidem (AMBIEN)  10 MG tablet Take one tab po qhs as needed, may take one extra if needed   [DISCONTINUED] Semaglutide-Weight Management 0.5 MG/0.5ML SOAJ Inject 0.5 mg into the skin once a week. (Patient not taking: Reported on 09/25/2021)   No facility-administered encounter medications on file as of 09/25/2021.    Surgical History: Past Surgical History:  Procedure Laterality Date   CESAREAN SECTION N/A 08/11/2015   Procedure: Primary CESAREAN SECTION;  Surgeon: Brien Few, MD;  Location: [facility];  Service: Obstetrics;  Laterality: N/A;  EDD: 08/17/15 Allergy: PCN   CESAREAN SECTION N/A 08/14/2017   Procedure: Repeat CESAREAN SECTION;  Surgeon: Brien Few, MD;  Location: [facility];  Service: Obstetrics;  Laterality: N/A;  EDD: 08/20/17 Allergy: Penicillin   DILATION AND EVACUATION N/A 03/15/2014   Procedure: DILATATION AND EVACUATION;  Surgeon: Lyman Speller, MD;  Location: [facility];  Service: Gynecology;  Laterality: N/A;   LAPAROSCOPY N/A 03/15/2014   Procedure: LAPAROSCOPY OPERATIVE;  Surgeon: Lyman Speller, MD;  Location: [facility];  Service: Gynecology;  Laterality: N/A;   WISDOM TOOTH EXTRACTION      Medical History: Past Medical History:  Diagnosis Date   ADHD    Bell palsy    Insomnia    Postpartum care following repeat cesarean delivery (6/29) 08/12/2015   Seasonal allergies    Sinusitis     Family History: Family History  Problem Relation Age of Onset  Uterine cancer Mother 49   Cancer - Ovarian Maternal Grandmother    Cancer - Lung Maternal Grandmother    Cancer - Lung Maternal Grandfather     Social History   Socioeconomic History   Marital status: Married    Spouse name: Not on file   Number of children: Not on file   Years of education: Not on file   Highest education level: Not on file  Occupational History   Not on file  Tobacco Use   Smoking status: Former    Packs/day: 1.00    Years: 13.00    Total pack years: 13.00    Types:  Cigarettes    Quit date: 08/12/2013    Years since quitting: 8.1   Smokeless tobacco: Never  Vaping Use   Vaping Use: Never used  Substance and Sexual Activity   Alcohol use: Yes    Alcohol/week: 5.0 standard drinks of alcohol    Types: 5 Glasses of wine per week    Comment: ocassionally   Drug use: No   Sexual activity: Yes    Partners: Male    Birth control/protection: None    Comment: approx 7-[redacted] wks gestation?? per patient  Other Topics Concern   Not on file  Social History Narrative   Not on file   Social Determinants of Health   Financial Resource Strain: Not on file  Food Insecurity: Not on file  Transportation Needs: Not on file  Physical Activity: Not on file  Stress: Not on file  Social Connections: Not on file  Intimate Partner Violence: Not on file      Review of Systems  Constitutional:  Negative for chills, fatigue and unexpected weight change.  HENT:  Negative for congestion, postnasal drip, rhinorrhea, sneezing and sore throat.   Eyes:  Negative for redness.  Respiratory:  Negative for cough, chest tightness and shortness of breath.   Cardiovascular:  Negative for chest pain and palpitations.  Gastrointestinal:  Negative for abdominal pain, constipation, diarrhea, nausea and vomiting.  Genitourinary:  Negative for dysuria and frequency.  Musculoskeletal:  Negative for arthralgias, back pain, joint swelling and neck pain.  Skin:  Negative for rash.  Neurological: Negative.  Negative for tremors and numbness.  Hematological:  Negative for adenopathy. Does not bruise/bleed easily.  Psychiatric/Behavioral:  Negative for behavioral problems (Depression), sleep disturbance and suicidal ideas. The patient is not nervous/anxious.     Vital Signs: BP 108/80   Pulse 81   Temp 98.3 F (36.8 C)   Resp 16   Ht '5\' 11"'$  (1.803 m)   Wt 231 lb 9.6 oz (105.1 kg)   SpO2 98%   BMI 32.30 kg/m    Physical Exam Constitutional:      Appearance: Normal appearance.   HENT:     Head: Normocephalic and atraumatic.     Nose: Nose normal.     Mouth/Throat:     Mouth: Mucous membranes are moist.     Pharynx: No posterior oropharyngeal erythema.  Eyes:     Extraocular Movements: Extraocular movements intact.     Pupils: Pupils are equal, round, and reactive to light.  Cardiovascular:     Pulses: Normal pulses.     Heart sounds: Normal heart sounds.  Pulmonary:     Effort: Pulmonary effort is normal.     Breath sounds: Normal breath sounds.  Neurological:     General: No focal deficit present.     Mental Status: She is alert.  Psychiatric:  Mood and Affect: Mood normal.        Behavior: Behavior normal.     Comments:  The following symptoms are common; at least 3 must be present to make the diagnosis: 2  Eating too fast  Feeling uncomfortably full  Eating large amounts when not hungry  Eating alone owing to embarrassment about binge behavior  Feelings of shame, guilt, or depression  Often presents with 1 or more comorbid psychiatric diagnoses 5 6  About 74% of people with binge-eating disorder report at least 1 lifetime psychiatric disorder 6  Mood disorder (most commonly major depression)  Anxiety disorder  Substance use disorder  Personality disorders, including avoidant, obsessive-compulsive, and borderline  Attention-deficit/hyperactivity disorder      Assessment/Plan: 1. Class 1 obesity due to excess calories without serious comorbidity with body mass index (BMI) of 33.0 to 33.9 in adult Pt has done well with Wagovey in the past, felt in control of her eating habits especially portion control, discussed keeping a food log, meal prep, buddy system like my plate app for calorie intake, apple watch or , pedometer for step count etc    2. Binge-eating disorder, in partial remission, moderate Will DC Adderall meets the criteria for BED, Start Low dose Vyvanse for now and see her response   3. Drug-induced insomnia  ([place]) Will continue Ambien as before    General Counseling: Heyli verbalizes understanding of the findings of todays visit and agrees with plan of treatment. I have discussed any further diagnostic evaluation that may be needed or ordered today. We also reviewed her medications today. she has been encouraged to call the office with any questions or concerns that should arise related to todays visit.    No orders of the defined types were placed in this encounter.   Meds ordered this encounter  Medications   lisdexamfetamine (VYVANSE) 20 MG capsule    Sig: Take 1 capsule (20 mg total) by mouth daily.    Dispense:  30 capsule    Refill:  0    Total time spent:35 Minutes Time spent includes review of chart, medications, test results, and follow up plan with the patient.   [place] Controlled Substance Database was reviewed by me.   Dr Lavera Guise Internal medicine

## 2021-10-06 ENCOUNTER — Telehealth: Payer: Self-pay

## 2021-10-06 NOTE — Telephone Encounter (Signed)
PA sent for VYVANSE 20 mg capsules 10/06/21 @ 4:46 pm and came back approved from 10/06/21 to 10/05/22  pt informed

## 2021-10-24 ENCOUNTER — Ambulatory Visit: Payer: BC Managed Care – PPO | Admitting: Internal Medicine

## 2021-10-24 ENCOUNTER — Telehealth: Payer: Self-pay | Admitting: Internal Medicine

## 2021-10-24 NOTE — Telephone Encounter (Signed)
Lvm to reschedule this morning's appointment-Toni

## 2021-12-18 DIAGNOSIS — L658 Other specified nonscarring hair loss: Secondary | ICD-10-CM | POA: Diagnosis not present

## 2021-12-19 ENCOUNTER — Ambulatory Visit: Payer: BC Managed Care – PPO | Admitting: Nurse Practitioner

## 2021-12-26 ENCOUNTER — Telehealth: Payer: Self-pay | Admitting: Nurse Practitioner

## 2021-12-26 NOTE — Telephone Encounter (Signed)
Lab report faxed to [facility]; [phone]

## 2022-01-01 ENCOUNTER — Ambulatory Visit: Payer: BC Managed Care – PPO | Admitting: Nurse Practitioner

## 2022-01-01 ENCOUNTER — Encounter: Payer: Self-pay | Admitting: Nurse Practitioner

## 2022-01-01 VITALS — BP 120/50 | HR 79 | Temp 97.8°F | Resp 16 | Ht 71.0 in | Wt 244.6 lb

## 2022-01-01 DIAGNOSIS — J0110 Acute frontal sinusitis, unspecified: Secondary | ICD-10-CM | POA: Diagnosis not present

## 2022-01-01 DIAGNOSIS — R41840 Attention and concentration deficit: Secondary | ICD-10-CM | POA: Diagnosis not present

## 2022-01-01 DIAGNOSIS — Z760 Encounter for issue of repeat prescription: Secondary | ICD-10-CM

## 2022-01-01 DIAGNOSIS — R635 Abnormal weight gain: Secondary | ICD-10-CM

## 2022-01-01 MED ORDER — ZOLPIDEM TARTRATE 10 MG PO TABS: ORAL_TABLET | ORAL | 0 refills | Status: DC

## 2022-01-01 MED ORDER — AMPHETAMINE-DEXTROAMPHETAMINE 5 MG PO TABS: ORAL_TABLET | ORAL | 0 refills | Status: DC

## 2022-01-01 MED ORDER — SERTRALINE HCL 50 MG PO TABS: 50.0000 mg | ORAL_TABLET | Freq: Every day | ORAL | 2 refills | Status: DC

## 2022-01-01 MED ORDER — DOXYCYCLINE HYCLATE 100 MG PO TABS: 100.0000 mg | ORAL_TABLET | Freq: Two times a day (BID) | ORAL | 0 refills | Status: DC

## 2022-01-01 MED ORDER — IBUPROFEN 600 MG PO TABS: 600.0000 mg | ORAL_TABLET | Freq: Four times a day (QID) | ORAL | 5 refills | Status: DC | PRN

## 2022-01-01 MED ORDER — TIRZEPATIDE 5 MG/0.5ML ~~LOC~~ SOAJ: 5.0000 mg | SUBCUTANEOUS | 1 refills | Status: DC

## 2022-01-01 NOTE — Progress Notes (Unsigned)
[facility] [place], [place] [address]  Internal MEDICINE  Office Visit Note  Patient Name: Deanna Sims  [id number]  [id number]  Date of Service: 01/01/2022  Chief Complaint  Patient presents with   Follow-up    Adhd med check     HPI Deanna Sims presents for a follow up visit for ADHD, medication refills and weight loss.  Weight loss management -- gained another 13 lbs, trying everything she can diet and lifestyle changes but nothing is helping. Interested in mounjaro/zepbound.  ADHD + refills -- stable with current dose, needs refills, denies palpitations or other side effects. Heart rate and BP are normal. Does not want to switch to vyvanse which was not covered by insurance anyway.  Anxiety/depression -- doing well on sertraline  Sinus infection sx -- sinus pressure, headache, nasal congestion, ear pressure, sore scratchy throat, cough, postnasal drip  Current Medication: Outpatient Encounter Medications as of 01/01/2022  Medication Sig   cetirizine (ZYRTEC) 10 MG tablet Take 1 tablet (10 mg total) by mouth daily.   doxycycline (VIBRA-TABS) 100 MG tablet Take 1 tablet (100 mg total) by mouth 2 (two) times daily.   fluticasone (FLONASE) 50 MCG/ACT nasal spray Place 1 spray into both nostrils daily as needed for allergies or rhinitis.   ondansetron (ZOFRAN) 4 MG tablet Take 1 tablet (4 mg total) by mouth every 8 (eight) hours as needed for nausea or vomiting.   SPRINTEC 28 0.25-35 MG-MCG tablet Take 1 tablet by mouth daily.   tirzepatide [facility]) 5 MG/0.5ML Pen Inject 5 mg into the skin once a week.   [DISCONTINUED] amphetamine-dextroamphetamine (ADDERALL) 5 MG tablet Take 2 tablets by mouth in the morning and take 1 tablet by mouth between noon and 2pm.   [DISCONTINUED] amphetamine-dextroamphetamine (ADDERALL) 5 MG tablet Take 2 tablets by mouth in the morning and take 1 tablet by mouth between noon and 2pm.   [DISCONTINUED] amphetamine-dextroamphetamine  (ADDERALL) 5 MG tablet Take 2 tablets by mouth in the morning and take 1 tablet by mouth between noon and 2pm.   [DISCONTINUED] ibuprofen (ADVIL) 600 MG tablet Take 1 tablet (600 mg total) by mouth every 6 (six) hours.   [DISCONTINUED] lisdexamfetamine (VYVANSE) 20 MG capsule Take 1 capsule (20 mg total) by mouth daily.   [DISCONTINUED] sertraline (ZOLOFT) 50 MG tablet Take 1 tablet (50 mg total) by mouth daily.   [DISCONTINUED] zolpidem (AMBIEN) 10 MG tablet Take one tab po qhs as needed, may take one extra if needed   [START ON 02/19/2022] amphetamine-dextroamphetamine (ADDERALL) 5 MG tablet Take 2 tablets by mouth in the morning and take 1 tablet by mouth between noon and 2pm.   [START ON 01/22/2022] amphetamine-dextroamphetamine (ADDERALL) 5 MG tablet Take 2 tablets by mouth in the morning and take 1 tablet by mouth between noon and 2pm.   amphetamine-dextroamphetamine (ADDERALL) 5 MG tablet Take 2 tablets by mouth in the morning and take 1 tablet by mouth between noon and 2pm.   ibuprofen (ADVIL) 600 MG tablet Take 1 tablet (600 mg total) by mouth every 6 (six) hours as needed for cramping.   sertraline (ZOLOFT) 50 MG tablet Take 1 tablet (50 mg total) by mouth daily.   zolpidem (AMBIEN) 10 MG tablet Take one tab po qhs as needed, may take one extra if needed   No facility-administered encounter medications on file as of 01/01/2022.    Surgical History: Past Surgical History:  Procedure Laterality Date   CESAREAN SECTION N/A 08/11/2015   Procedure:  Primary CESAREAN SECTION;  Surgeon: Brien Few, MD;  Location: [facility];  Service: Obstetrics;  Laterality: N/A;  EDD: 08/17/15 Allergy: PCN   CESAREAN SECTION N/A 08/14/2017   Procedure: Repeat CESAREAN SECTION;  Surgeon: Brien Few, MD;  Location: [facility];  Service: Obstetrics;  Laterality: N/A;  EDD: 08/20/17 Allergy: Penicillin   DILATION AND EVACUATION N/A 03/15/2014   Procedure: DILATATION AND EVACUATION;  Surgeon:  Lyman Speller, MD;  Location: [facility];  Service: Gynecology;  Laterality: N/A;   LAPAROSCOPY N/A 03/15/2014   Procedure: LAPAROSCOPY OPERATIVE;  Surgeon: Lyman Speller, MD;  Location: [facility];  Service: Gynecology;  Laterality: N/A;   WISDOM TOOTH EXTRACTION      Medical History: Past Medical History:  Diagnosis Date   ADHD    Bell palsy    Insomnia    Postpartum care following repeat cesarean delivery (6/29) 08/12/2015   Seasonal allergies    Sinusitis     Family History: Family History  Problem Relation Age of Onset   Uterine cancer Mother 31   Cancer - Ovarian Maternal Grandmother    Cancer - Lung Maternal Grandmother    Cancer - Lung Maternal Grandfather     Social History   Socioeconomic History   Marital status: Married    Spouse name: Not on file   Number of children: Not on file   Years of education: Not on file   Highest education level: Not on file  Occupational History   Not on file  Tobacco Use   Smoking status: Former    Packs/day: 1.00    Years: 13.00    Total pack years: 13.00    Types: Cigarettes    Quit date: 08/12/2013    Years since quitting: 8.3   Smokeless tobacco: Never  Vaping Use   Vaping Use: Never used  Substance and Sexual Activity   Alcohol use: Yes    Alcohol/week: 5.0 standard drinks of alcohol    Types: 5 Glasses of wine per week    Comment: ocassionally   Drug use: No   Sexual activity: Yes    Partners: Male    Birth control/protection: None    Comment: approx 7-[redacted] wks gestation?? per patient  Other Topics Concern   Not on file  Social History Narrative   Not on file   Social Determinants of Health   Financial Resource Strain: Not on file  Food Insecurity: Not on file  Transportation Needs: Not on file  Physical Activity: Not on file  Stress: Not on file  Social Connections: Not on file  Intimate Partner Violence: Not on file      Review of Systems  Constitutional:  Positive for fatigue and unexpected  weight change. Negative for chills.  HENT:  Positive for congestion, ear pain, postnasal drip, rhinorrhea, sinus pressure, sinus pain, sneezing and sore throat. Negative for mouth sores and trouble swallowing.   Eyes:  Negative for redness.  Respiratory:  Positive for cough. Negative for chest tightness, shortness of breath and wheezing.   Cardiovascular: Negative.  Negative for chest pain and palpitations.  Gastrointestinal:  Negative for abdominal pain, constipation, diarrhea, nausea and vomiting.  Genitourinary:  Negative for dysuria and frequency.  Musculoskeletal:  Negative for arthralgias, back pain, joint swelling and neck pain.  Neurological:  Positive for dizziness and headaches. Negative for tremors and numbness.  Hematological:  Negative for adenopathy. Does not bruise/bleed easily.  Psychiatric/Behavioral:  Positive for behavioral problems (Depression) and sleep disturbance. Negative for  self-injury and suicidal ideas. The patient is nervous/anxious.     Vital Signs: BP (!) 120/50   Pulse 79   Temp 97.8 F (36.6 C)   Resp 16   Ht '5\' 11"'$  (1.803 m)   Wt 244 lb 9.6 oz (110.9 kg)   SpO2 95%   BMI 34.11 kg/m    Physical Exam Vitals reviewed.  Constitutional:      General: She is awake. She is not in acute distress.    Appearance: Normal appearance. She is well-developed and well-groomed. She is obese. She is ill-appearing.  HENT:     Head: Normocephalic and atraumatic.     Right Ear: Tympanic membrane, ear canal and external ear normal.     Left Ear: Tympanic membrane, ear canal and external ear normal.     Nose: Mucosal edema, congestion and rhinorrhea present.     Right Turbinates: Swollen and pale.     Left Turbinates: Swollen and pale.     Right Sinus: Frontal sinus tenderness present. No maxillary sinus tenderness.     Left Sinus: Frontal sinus tenderness present. No maxillary sinus tenderness.     Mouth/Throat:     Lips: Pink.     Mouth: Mucous membranes are  moist.     Pharynx: Uvula midline. Posterior oropharyngeal erythema and uvula swelling present. No oropharyngeal exudate.     Tonsils: No tonsillar exudate or tonsillar abscesses.  Eyes:     Pupils: Pupils are equal, round, and reactive to light.  Cardiovascular:     Rate and Rhythm: Normal rate and regular rhythm.     Heart sounds: Normal heart sounds. No murmur heard. Pulmonary:     Effort: Pulmonary effort is normal. No respiratory distress.     Breath sounds: Normal breath sounds. No wheezing.  Lymphadenopathy:     Cervical: Cervical adenopathy present.  Neurological:     Mental Status: She is alert and oriented to person, place, and time.  Psychiatric:        Mood and Affect: Mood normal.        Behavior: Behavior normal. Behavior is cooperative.        Assessment/Plan: 1. Acute non-recurrent frontal sinusitis Empiric antibiotic treatment prescribed.  - doxycycline (VIBRA-TABS) 100 MG tablet; Take 1 tablet (100 mg total) by mouth 2 (two) times daily.  Dispense: 20 tablet; Refill: 0  2. Attention and concentration deficit Refills x3 months prescribed, follow up in 3 months for additional refills.  - amphetamine-dextroamphetamine (ADDERALL) 5 MG tablet; Take 2 tablets by mouth in the morning and take 1 tablet by mouth between noon and 2pm.  Dispense: 90 tablet; Refill: 0 - amphetamine-dextroamphetamine (ADDERALL) 5 MG tablet; Take 2 tablets by mouth in the morning and take 1 tablet by mouth between noon and 2pm.  Dispense: 90 tablet; Refill: 0 - amphetamine-dextroamphetamine (ADDERALL) 5 MG tablet; Take 2 tablets by mouth in the morning and take 1 tablet by mouth between noon and 2pm.  Dispense: 90 tablet; Refill: 0  3. Abnormal weight gain A1c normal, will try to get mounjaro in anticipation of switching to Zepbound when available.  - tirzepatide [facility]) 5 MG/0.5ML Pen; Inject 5 mg into the skin once a week.  Dispense: 6 mL; Refill: 1 - POCT glycosylated hemoglobin (Hb  A1C)  4. Medication refill - sertraline (ZOLOFT) 50 MG tablet; Take 1 tablet (50 mg total) by mouth daily.  Dispense: 90 tablet; Refill: 2 - zolpidem (AMBIEN) 10 MG tablet; Take one tab po qhs as needed,  may take one extra if needed  Dispense: 30 tablet; Refill: 0 - ibuprofen (ADVIL) 600 MG tablet; Take 1 tablet (600 mg total) by mouth every 6 (six) hours as needed for cramping.  Dispense: 30 tablet; Refill: 5   General Counseling: Brentney verbalizes understanding of the findings of todays visit and agrees with plan of treatment. I have discussed any further diagnostic evaluation that may be needed or ordered today. We also reviewed her medications today. she has been encouraged to call the office with any questions or concerns that should arise related to todays visit.    Orders Placed This Encounter  Procedures   POCT glycosylated hemoglobin (Hb A1C)    Meds ordered this encounter  Medications   sertraline (ZOLOFT) 50 MG tablet    Sig: Take 1 tablet (50 mg total) by mouth daily.    Dispense:  90 tablet    Refill:  2   amphetamine-dextroamphetamine (ADDERALL) 5 MG tablet    Sig: Take 2 tablets by mouth in the morning and take 1 tablet by mouth between noon and 2pm.    Dispense:  90 tablet    Refill:  0    Fill for january   amphetamine-dextroamphetamine (ADDERALL) 5 MG tablet    Sig: Take 2 tablets by mouth in the morning and take 1 tablet by mouth between noon and 2pm.    Dispense:  90 tablet    Refill:  0    Fill for december   amphetamine-dextroamphetamine (ADDERALL) 5 MG tablet    Sig: Take 2 tablets by mouth in the morning and take 1 tablet by mouth between noon and 2pm.    Dispense:  90 tablet    Refill:  0    Fill for november   zolpidem (AMBIEN) 10 MG tablet    Sig: Take one tab po qhs as needed, may take one extra if needed    Dispense:  30 tablet    Refill:  0   ibuprofen (ADVIL) 600 MG tablet    Sig: Take 1 tablet (600 mg total) by mouth every 6 (six) hours as  needed for cramping.    Dispense:  30 tablet    Refill:  5   tirzepatide (MOUNJARO) 5 MG/0.5ML Pen    Sig: Inject 5 mg into the skin once a week.    Dispense:  6 mL    Refill:  1    New script try through insurance, if not covered, will have coupon, thanks. 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    Dispense:  20 tablet    Refill:  0    Return in about 1 month (around 01/31/2022) for F/U, Weight loss, Larrissa Stivers PCP.   Total time spent:30 Minutes Time spent includes review of chart, medications, test results, and follow up plan with the patient.   [place] Controlled Substance Database was reviewed by me.  This patient was seen by Jonetta Osgood, FNP-C in collaboration with Dr. Clayborn Bigness as a part of collaborative care agreement.   Madesyn Ast R. Valetta Fuller, MSN, FNP-C Internal medicine

## 2022-01-02 ENCOUNTER — Telehealth: Payer: Self-pay

## 2022-01-02 LAB — POCT GLYCOSYLATED HEMOGLOBIN (HGB A1C): Hemoglobin A1C: 5.6 % (ref 4.0–5.6)

## 2022-01-03 ENCOUNTER — Telehealth: Payer: Self-pay

## 2022-01-09 NOTE — Telephone Encounter (Signed)
done

## 2022-01-18 IMAGING — US US BREAST*R* LIMITED INC AXILLA
1 series · 6 of 6 positions shown · non-contrast
Comparison: Previous exam(s).

CLINICAL DATA: Recall from outside screening mammography with
tomosynthesis, possible asymmetry involving the INNER RIGHT breast
at POSTERIOR depth visible only on the CC images.

EXAM:
DIGITAL DIAGNOSTIC RIGHT MAMMOGRAM WITH CAD AND TOMO
ULTRASOUND RIGHT BREAST

[Series 2: us breast*right* limited inc axilla · 0.06mm/px · 6 of 6 slices shown]
[im 1/6]
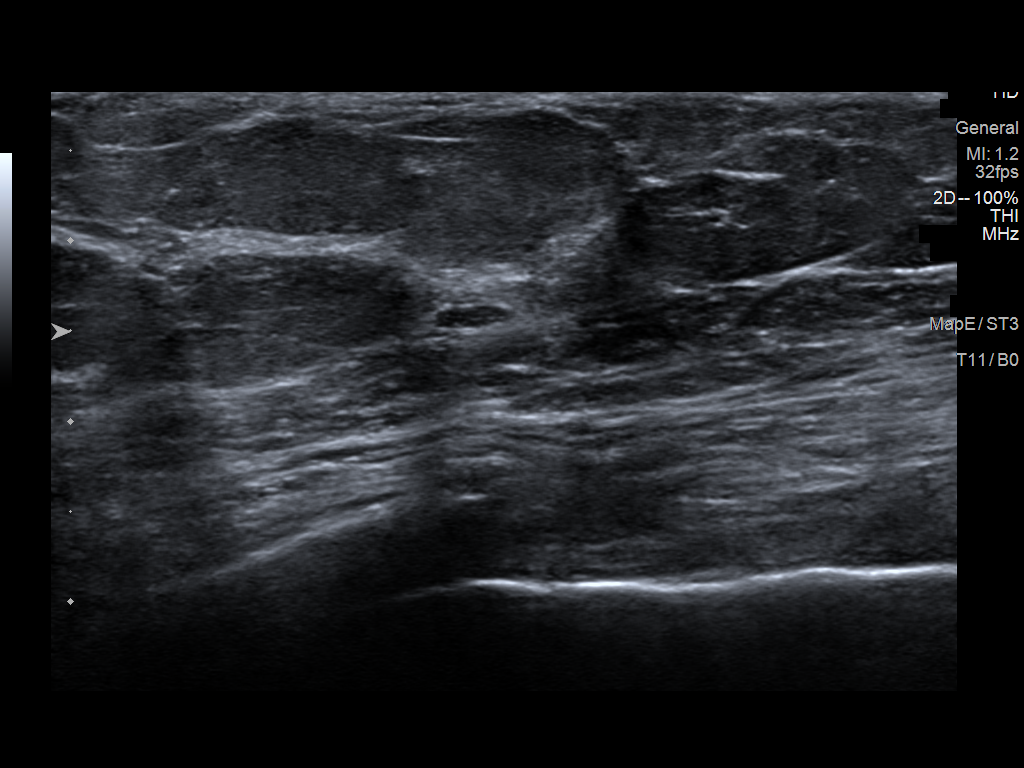
[im 2/6]
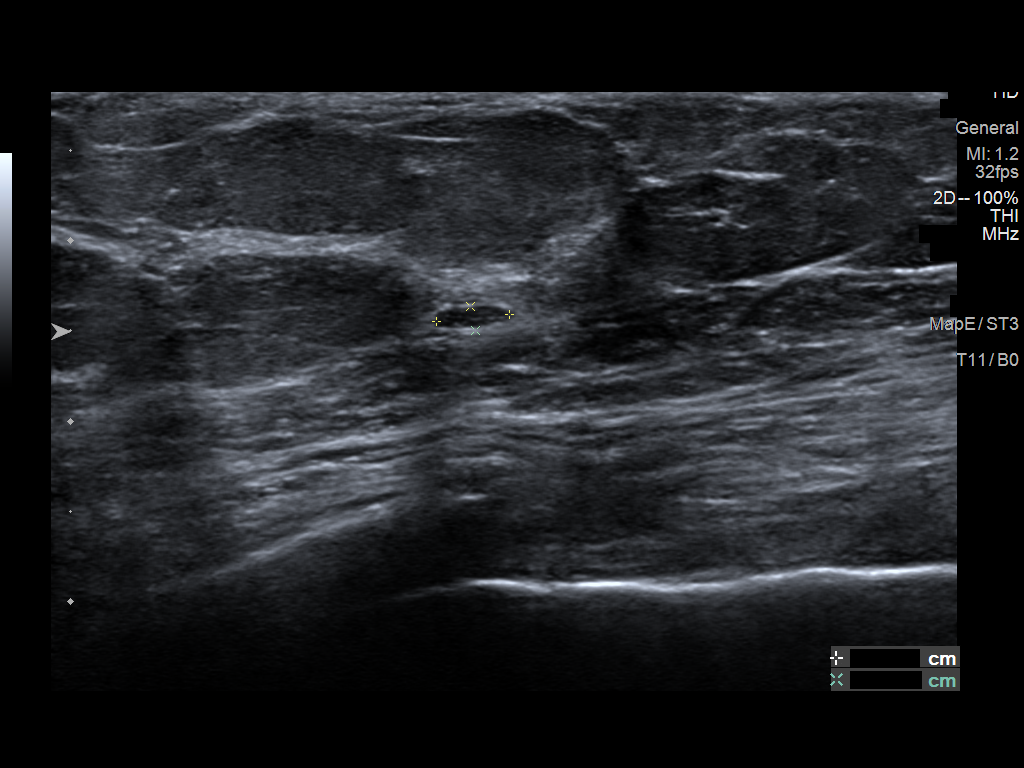
[im 3/6]
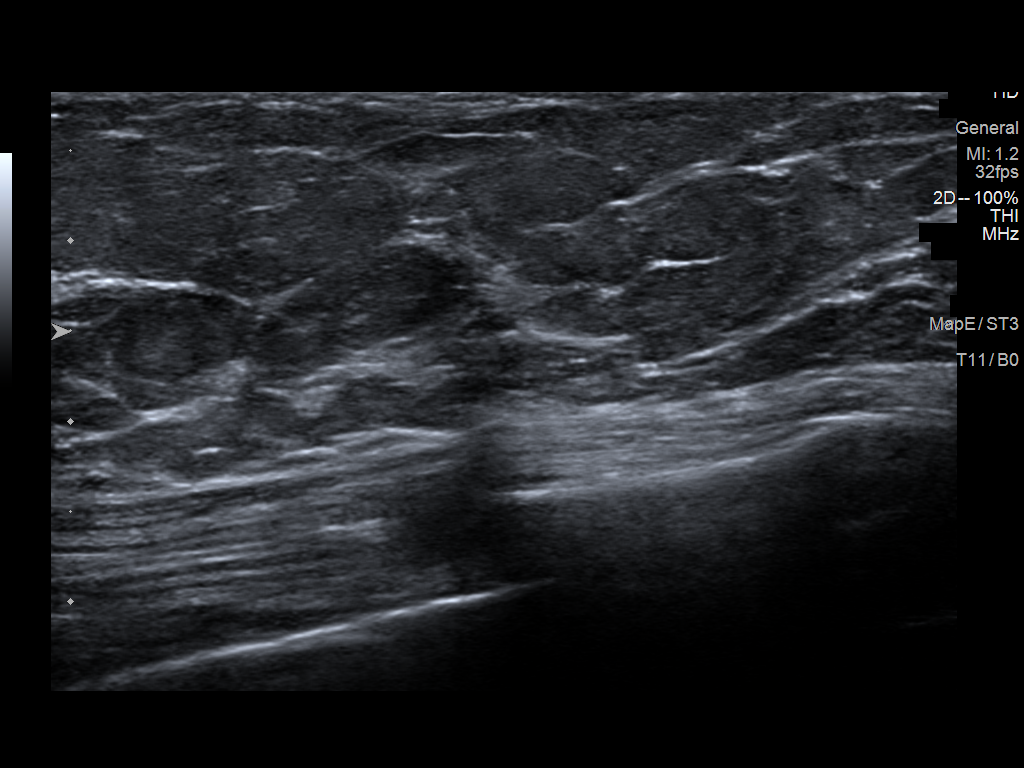
[im 4/6]
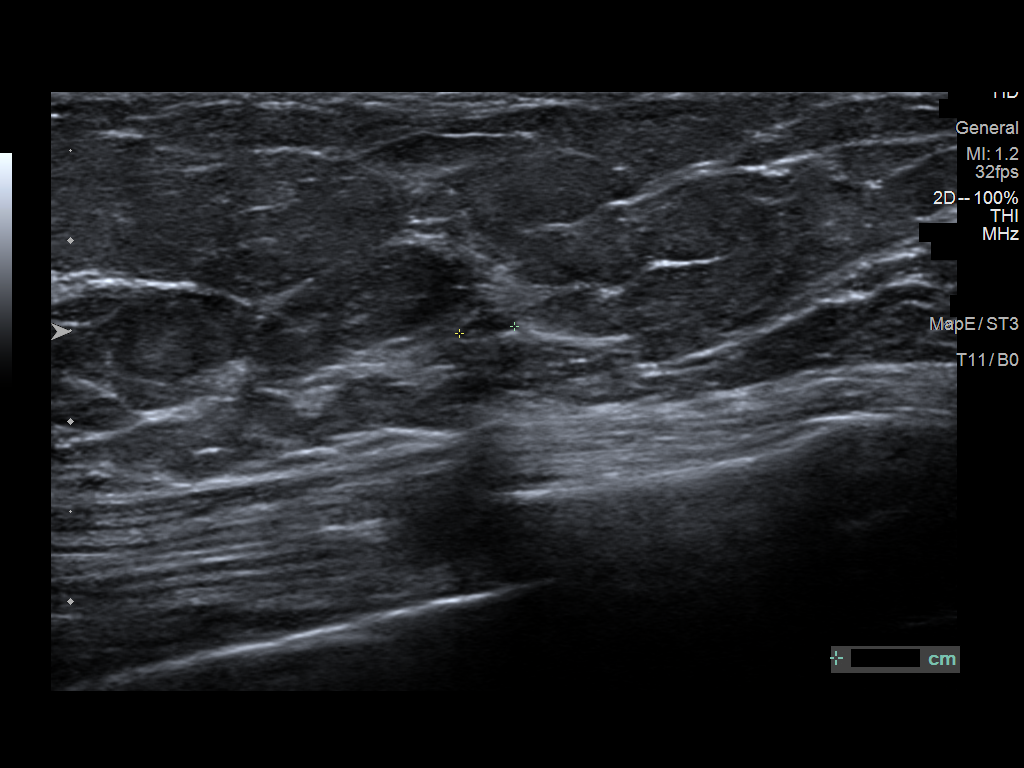
[im 5/6]
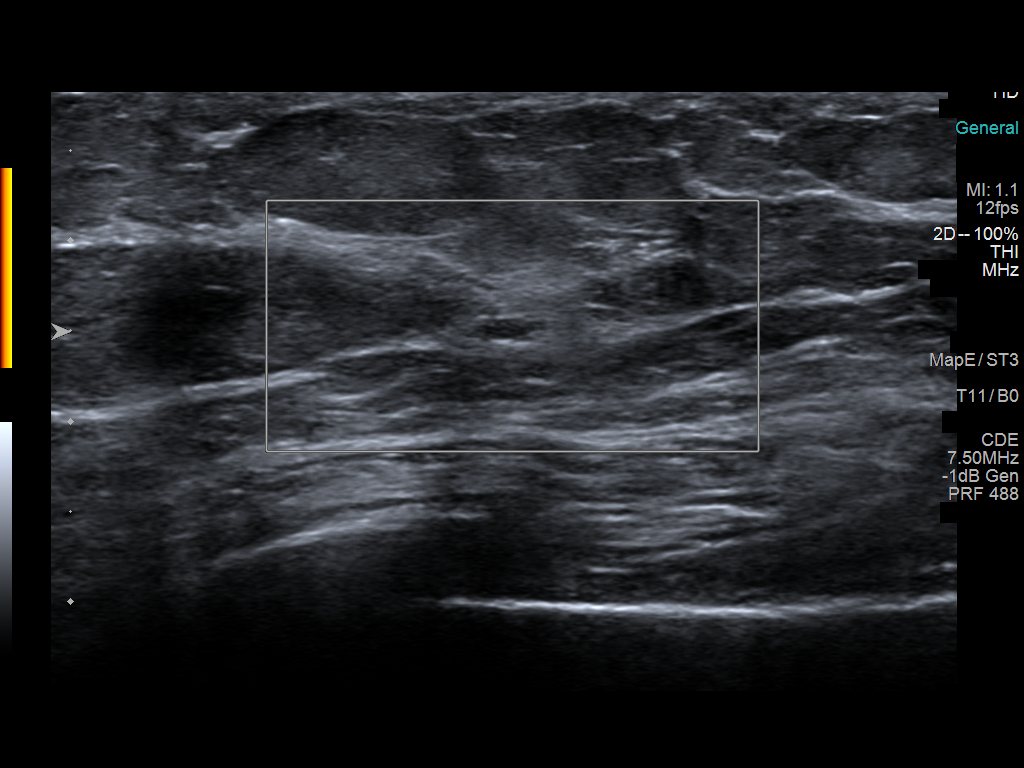
[im 6/6]
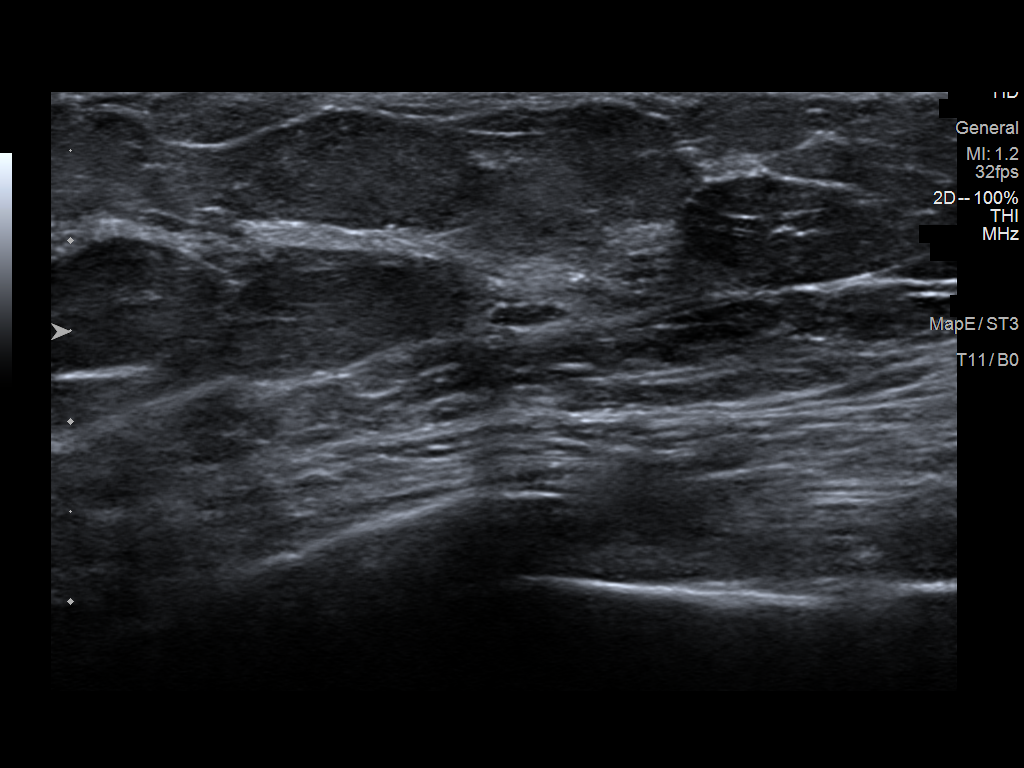

[6 of 6 positions shown; findings below may reference images not displayed]

ACR Breast Density Category c: The breast tissue is heterogeneously
dense, which may obscure small masses.
FINDINGS: Tomosynthesis and synthesized spot-compression CC view of the area
of concern in the RIGHT breast and a tomosynthesis and synthesized
full field mediolateral view of the RIGHT breast were obtained.

The asymmetry persists on the spot compression images and measures
approximately 5 mm. There is fat centrally within the asymmetry
which is located immediately adjacent to a vein in the INNER breast.
There is no associated architectural distortion or suspicious
calcifications.

The mass localizes to the retroareolar location on the full field
mediolateral images, indicating its location at or near 3 o'clock.
No suspicious findings elsewhere in the RIGHT breast on the full
field mediolateral images.

The full field mediolateral image was processed with CAD.

Targeted RIGHT breast ultrasound is performed, showing an oval
parallel anechoic mass with scattered internal echoes at the 2:30
o'clock position approximately 6 cm at POSTERIOR depth, adjacent to
the vein identified on mammography, measuring approximately 4 x 2 x
3 mm, demonstrating posterior acoustic enhancement and no internal
power Doppler flow, corresponding to the screening mammographic
finding. No suspicious solid mass or abnormal acoustic shadowing is
identified in INNER breast with imaging from [DATE] to [DATE].
IMPRESSION: 1. No mammographic or sonographic evidence of malignancy involving
the RIGHT breast.
2. Benign complicated cyst involving slight UPPER INNER QUADRANT of
the RIGHT breast at POSTERIOR depth which accounts for the screening
mammographic finding.

RECOMMENDATION:
Screening mammogram in one year.(Code:IV-S-QLZ)

I have discussed the findings and recommendations with the patient.
If applicable, a reminder letter will be sent to the patient
regarding the next appointment.

BI-RADS CATEGORY  2: Benign.

## 2022-01-19 ENCOUNTER — Telehealth: Payer: Self-pay | Admitting: Nurse Practitioner

## 2022-01-22 ENCOUNTER — Telehealth: Payer: Self-pay

## 2022-01-22 NOTE — Telephone Encounter (Signed)
Spoke with pt that Deanna Sims waiting for zepbound we will send in jan due to [place] is denied

## 2022-01-23 NOTE — Telephone Encounter (Signed)
Error

## 2022-01-30 ENCOUNTER — Ambulatory Visit: Payer: BC Managed Care – PPO | Admitting: Nurse Practitioner

## 2022-01-30 ENCOUNTER — Encounter: Payer: Self-pay | Admitting: Nurse Practitioner

## 2022-01-30 ENCOUNTER — Other Ambulatory Visit: Payer: Self-pay | Admitting: Nurse Practitioner

## 2022-01-30 VITALS — BP 148/77 | HR 82 | Temp 98.3°F | Resp 16 | Ht 71.0 in | Wt 236.6 lb

## 2022-01-30 DIAGNOSIS — E6609 Other obesity due to excess calories: Secondary | ICD-10-CM | POA: Diagnosis not present

## 2022-01-30 DIAGNOSIS — R635 Abnormal weight gain: Secondary | ICD-10-CM | POA: Diagnosis not present

## 2022-01-30 DIAGNOSIS — Z6833 Body mass index (BMI) 33.0-33.9, adult: Secondary | ICD-10-CM | POA: Diagnosis not present

## 2022-01-30 MED ORDER — ZEPBOUND 5 MG/0.5ML ~~LOC~~ SOAJ: 5.0000 mg | SUBCUTANEOUS | 1 refills | Status: DC

## 2022-01-30 NOTE — Telephone Encounter (Signed)
Please fax paper [place] for [place]

## 2022-01-30 NOTE — Progress Notes (Signed)
[facility] [place], [place] [address]  Internal MEDICINE  Office Visit Note  Patient Name: Deanna Sims  [id number]  [id number]  Date of Service: 01/30/2022  Chief Complaint  Patient presents with   Follow-up    Weight loss    HPI Venna presents for a follow up visit for weight loss management. --lost 8 lbs with first 4 weeks of mounjaro, med was denied by insurance which was expected and the plan is to try zepbound  Patient wants to continue and is interested in trying zepbound, copay assistance information provided, will complete prior auth for the med if needed.    Current Medication: Outpatient Encounter Medications as of 01/30/2022  Medication Sig   [START ON 02/19/2022] amphetamine-dextroamphetamine (ADDERALL) 5 MG tablet Take 2 tablets by mouth in the morning and take 1 tablet by mouth between noon and 2pm.   amphetamine-dextroamphetamine (ADDERALL) 5 MG tablet Take 2 tablets by mouth in the morning and take 1 tablet by mouth between noon and 2pm.   amphetamine-dextroamphetamine (ADDERALL) 5 MG tablet Take 2 tablets by mouth in the morning and take 1 tablet by mouth between noon and 2pm.   cetirizine (ZYRTEC) 10 MG tablet Take 1 tablet (10 mg total) by mouth daily.   doxycycline (VIBRA-TABS) 100 MG tablet Take 1 tablet (100 mg total) by mouth 2 (two) times daily.   fluticasone (FLONASE) 50 MCG/ACT nasal spray Place 1 spray into both nostrils daily as needed for allergies or rhinitis.   ibuprofen (ADVIL) 600 MG tablet Take 1 tablet (600 mg total) by mouth every 6 (six) hours as needed for cramping.   ondansetron (ZOFRAN) 4 MG tablet Take 1 tablet (4 mg total) by mouth every 8 (eight) hours as needed for nausea or vomiting.   sertraline (ZOLOFT) 50 MG tablet Take 1 tablet (50 mg total) by mouth daily.   SPRINTEC 28 0.25-35 MG-MCG tablet Take 1 tablet by mouth daily.   tirzepatide [facility]) 5 MG/0.5ML Pen Inject 5 mg into the skin once a week.    Tirzepatide-Weight Management (ZEPBOUND) 5 MG/0.5ML SOAJ Inject 5 mg into the skin once a week.   zolpidem (AMBIEN) 10 MG tablet Take one tab po qhs as needed, may take one extra if needed   No facility-administered encounter medications on file as of 01/30/2022.    Surgical History: Past Surgical History:  Procedure Laterality Date   CESAREAN SECTION N/A 08/11/2015   Procedure: Primary CESAREAN SECTION;  Surgeon: Brien Few, MD;  Location: [place];  Service: Obstetrics;  Laterality: N/A;  EDD: 08/17/15 Allergy: PCN   CESAREAN SECTION N/A 08/14/2017   Procedure: Repeat CESAREAN SECTION;  Surgeon: Brien Few, MD;  Location: [place];  Service: Obstetrics;  Laterality: N/A;  EDD: 08/20/17 Allergy: Penicillin   DILATION AND EVACUATION N/A 03/15/2014   Procedure: DILATATION AND EVACUATION;  Surgeon: Lyman Speller, MD;  Location: [facility];  Service: Gynecology;  Laterality: N/A;   LAPAROSCOPY N/A 03/15/2014   Procedure: LAPAROSCOPY OPERATIVE;  Surgeon: Lyman Speller, MD;  Location: [facility];  Service: Gynecology;  Laterality: N/A;   WISDOM TOOTH EXTRACTION      Medical History: Past Medical History:  Diagnosis Date   ADHD    Bell palsy    Insomnia    Postpartum care following repeat cesarean delivery (6/29) 08/12/2015   Seasonal allergies    Sinusitis     Family History: Family History  Problem Relation Age of Onset   Uterine cancer Mother  69   Cancer - Ovarian Maternal Grandmother    Cancer - Lung Maternal Grandmother    Cancer - Lung Maternal Grandfather     Social History   Socioeconomic History   Marital status: Married    Spouse name: Not on file   Number of children: Not on file   Years of education: Not on file   Highest education level: Not on file  Occupational History   Not on file  Tobacco Use   Smoking status: Former    Packs/day: 1.00    Years: 13.00    Total pack years: 13.00    Types: Cigarettes    Quit date: 08/12/2013     Years since quitting: 8.4   Smokeless tobacco: Never  Vaping Use   Vaping Use: Never used  Substance and Sexual Activity   Alcohol use: Yes    Alcohol/week: 5.0 standard drinks of alcohol    Types: 5 Glasses of wine per week    Comment: ocassionally   Drug use: No   Sexual activity: Yes    Partners: Male    Birth control/protection: None    Comment: approx 7-[redacted] wks gestation?? per patient  Other Topics Concern   Not on file  Social History Narrative   Not on file   Social Determinants of Health   Financial Resource Strain: Not on file  Food Insecurity: Not on file  Transportation Needs: Not on file  Physical Activity: Not on file  Stress: Not on file  Social Connections: Not on file  Intimate Partner Violence: Not on file      Review of Systems  Constitutional:  Negative for chills, fatigue and unexpected weight change.  HENT:  Negative for congestion, postnasal drip, rhinorrhea, sneezing and sore throat.   Eyes:  Negative for redness.  Respiratory:  Negative for cough, chest tightness and shortness of breath.   Cardiovascular:  Negative for chest pain and palpitations.  Gastrointestinal:  Negative for abdominal pain, constipation, diarrhea, nausea and vomiting.  Genitourinary:  Negative for dysuria and frequency.  Musculoskeletal:  Negative for arthralgias, back pain, joint swelling and neck pain.  Skin:  Negative for rash.  Neurological: Negative.  Negative for tremors and numbness.  Hematological:  Negative for adenopathy. Does not bruise/bleed easily.  Psychiatric/Behavioral:  Negative for behavioral problems (Depression), sleep disturbance and suicidal ideas. The patient is not nervous/anxious.     Vital Signs: BP (!) 148/77   Pulse 82   Temp 98.3 F (36.8 C)   Resp 16   Ht '5\' 11"'$  (1.803 m)   Wt 236 lb 9.6 oz (107.3 kg)   SpO2 98%   BMI 33.00 kg/m    Physical Exam Vitals reviewed.  Constitutional:      Appearance: Normal appearance.  HENT:      Head: Normocephalic and atraumatic.     Mouth/Throat:     Pharynx: No posterior oropharyngeal erythema.  Eyes:     Extraocular Movements: Extraocular movements intact.     Pupils: Pupils are equal, round, and reactive to light.  Cardiovascular:     Rate and Rhythm: Normal rate and regular rhythm.     Pulses: Normal pulses.  Pulmonary:     Effort: Pulmonary effort is normal.  Neurological:     Mental Status: She is alert and oriented to person, place, and time.  Psychiatric:        Mood and Affect: Mood normal.        Behavior: Behavior normal.  Comments:          Assessment/Plan: 1. Abnormal weight gain Zepbound prescribed, will wait for prior authorization. Follow up in 2 months - Tirzepatide-Weight Management (ZEPBOUND) 5 MG/0.5ML SOAJ; Inject 5 mg into the skin once a week.  Dispense: 6 mL; Refill: 1  2. Class 1 obesity due to excess calories without serious comorbidity with body mass index (BMI) of 33.0 to 33.9 in adult See problem #1 - Tirzepatide-Weight Management (ZEPBOUND) 5 MG/0.5ML SOAJ; Inject 5 mg into the skin once a week.  Dispense: 6 mL; Refill: 1   General Counseling: Shelvia verbalizes understanding of the findings of todays visit and agrees with plan of treatment. I have discussed any further diagnostic evaluation that may be needed or ordered today. We also reviewed her medications today. she has been encouraged to call the office with any questions or concerns that should arise related to todays visit.    No orders of the defined types were placed in this encounter.   Meds ordered this encounter  Medications   Tirzepatide-Weight Management (ZEPBOUND) 5 MG/0.5ML SOAJ    Sig: Inject 5 mg into the skin once a week.    Dispense:  6 mL    Refill:  1    Dx code E66.01 BMI 33.00    Return in about 2 months (around 04/02/2022) for F/U, Weight loss, Takhia Spoon PCP.   Total time spent:30 Minutes Time spent includes review of chart, medications, test  results, and follow up plan with the patient.   [place] Controlled Substance Database was reviewed by me.  This patient was seen by Jonetta Osgood, FNP-C in collaboration with Dr. Clayborn Bigness as a part of collaborative care agreement.   Shanele Nissan R. Valetta Fuller, MSN, FNP-C Internal medicine

## 2022-01-31 ENCOUNTER — Encounter: Payer: Self-pay | Admitting: Nurse Practitioner

## 2022-02-01 NOTE — Telephone Encounter (Signed)
done

## 2022-02-27 ENCOUNTER — Encounter: Payer: BC Managed Care – PPO | Admitting: Nurse Practitioner

## 2022-03-16 ENCOUNTER — Encounter: Payer: Self-pay | Admitting: Nurse Practitioner

## 2022-03-16 ENCOUNTER — Ambulatory Visit (INDEPENDENT_AMBULATORY_CARE_PROVIDER_SITE_OTHER): Payer: BC Managed Care – PPO | Admitting: Nurse Practitioner

## 2022-03-16 VITALS — BP 124/64 | HR 75 | Temp 97.6°F | Resp 16 | Ht 71.0 in | Wt 236.4 lb

## 2022-03-16 DIAGNOSIS — R300 Dysuria: Secondary | ICD-10-CM

## 2022-03-16 DIAGNOSIS — E559 Vitamin D deficiency, unspecified: Secondary | ICD-10-CM | POA: Diagnosis not present

## 2022-03-16 DIAGNOSIS — E538 Deficiency of other specified B group vitamins: Secondary | ICD-10-CM

## 2022-03-16 DIAGNOSIS — E782 Mixed hyperlipidemia: Secondary | ICD-10-CM | POA: Diagnosis not present

## 2022-03-16 DIAGNOSIS — Z0001 Encounter for general adult medical examination with abnormal findings: Secondary | ICD-10-CM

## 2022-03-16 DIAGNOSIS — R41840 Attention and concentration deficit: Secondary | ICD-10-CM

## 2022-03-16 MED ORDER — AMPHETAMINE-DEXTROAMPHETAMINE 10 MG PO TABS: ORAL_TABLET | ORAL | 0 refills | Status: DC

## 2022-03-16 NOTE — Progress Notes (Signed)
[facility] [place], [place] [address]  Internal MEDICINE  Office Visit Note  Patient Name: Deanna Sims  [id number]  [id number]  Date of Service: 03/16/2022  Chief Complaint  Patient presents with   Annual Exam    HPI Deanna Sims presents for an annual well visit and physical exam.  Well-appearing 47 y.o. female with depression, insomnia, fatigue, ADD, and varicose veins.  Routine CRC screening: has cologuard test at home Routine mammogram: gets done at [facility] in march  Pap smear: goes to OBGYN in march Labs: due for routine labs New or worsening pain: none  Other concerns: none  [facility] and getting [facility]epatide injections.  --wants to increase adderall dose. Denies any palpitations or other adverse side effects. HR and BP is normal.      03/16/2022   10:03 AM  Depression screen PHQ 2/9  Decreased Interest 0  Down, Depressed, Hopeless 0  PHQ - 2 Score 0    Functional Status Survey: Is the patient deaf or have difficulty hearing?: No Does the patient have difficulty seeing, even when wearing glasses/contacts?: No Does the patient have difficulty concentrating, remembering, or making decisions?: No Does the patient have difficulty walking or climbing stairs?: No Does the patient have difficulty dressing or bathing?: No Does the patient have difficulty doing errands alone such as visiting a doctor's office or shopping?: No      06/13/2021    8:58 AM 09/12/2021    9:24 AM 09/25/2021   10:33 AM 01/01/2022    8:23 AM 03/16/2022   10:03 AM  Fall Risk  Falls in the past year? 1 0 0 0 0  Was there an injury with Fall? 0   0 0  Fall Risk Category Calculator 1   0 0  Fall Risk Category (Retired) Low   Low   (RETIRED) Patient Fall Risk Level Low fall risk   Low fall risk   Patient at Risk for Falls Due to    No Fall Risks No Fall Risks  Fall risk Follow up Falls evaluation completed   Falls evaluation completed Falls evaluation completed     Current  Medication: Outpatient Encounter Medications as of 03/16/2022  Medication Sig   amphetamine-dextroamphetamine (ADDERALL) 10 MG tablet Take 1 tablet by mouth in the morning and take 1 tablet by mouth between noon and 2pm.   [START ON 04/13/2022] amphetamine-dextroamphetamine (ADDERALL) 10 MG tablet Take 1 tablet by mouth in the morning and take 1 tablet by mouth between noon and 2pm.   [START ON 05/11/2022] amphetamine-dextroamphetamine (ADDERALL) 10 MG tablet Take 1 tablet by mouth in the morning and take 1 tablet by mouth between noon and 2pm.   cetirizine (ZYRTEC) 10 MG tablet Take 1 tablet (10 mg total) by mouth daily.   fluticasone (FLONASE) 50 MCG/ACT nasal spray Place 1 spray into both nostrils daily as needed for allergies or rhinitis.   ibuprofen (ADVIL) 600 MG tablet Take 1 tablet (600 mg total) by mouth every 6 (six) hours as needed for cramping.   ondansetron (ZOFRAN) 4 MG tablet Take 1 tablet (4 mg total) by mouth every 8 (eight) hours as needed for nausea or vomiting.   sertraline (ZOLOFT) 50 MG tablet Take 1 tablet (50 mg total) by mouth daily.   SPRINTEC 28 0.25-35 MG-MCG tablet Take 1 tablet by mouth daily.   Tirzepatide-Weight Management (ZEPBOUND) 5 MG/0.5ML SOAJ Inject 5 mg into the skin once a week.   zolpidem (AMBIEN) 10 MG tablet  Take one tab po qhs as needed, may take one extra if needed   [DISCONTINUED] amphetamine-dextroamphetamine (ADDERALL) 5 MG tablet Take 2 tablets by mouth in the morning and take 1 tablet by mouth between noon and 2pm.   [DISCONTINUED] amphetamine-dextroamphetamine (ADDERALL) 5 MG tablet Take 2 tablets by mouth in the morning and take 1 tablet by mouth between noon and 2pm.   [DISCONTINUED] amphetamine-dextroamphetamine (ADDERALL) 5 MG tablet Take 2 tablets by mouth in the morning and take 1 tablet by mouth between noon and 2pm.   [DISCONTINUED] doxycycline (VIBRA-TABS) 100 MG tablet Take 1 tablet (100 mg total) by mouth 2 (two) times daily.    [DISCONTINUED] tirzepatide [facility]) 5 MG/0.5ML Pen Inject 5 mg into the skin once a week.   No facility-administered encounter medications on file as of 03/16/2022.    Surgical History: Past Surgical History:  Procedure Laterality Date   CESAREAN SECTION N/A 08/11/2015   Procedure: Primary CESAREAN SECTION;  Surgeon: Brien Few, MD;  Location: [place];  Service: Obstetrics;  Laterality: N/A;  EDD: 08/17/15 Allergy: PCN   CESAREAN SECTION N/A 08/14/2017   Procedure: Repeat CESAREAN SECTION;  Surgeon: Brien Few, MD;  Location: [facility];  Service: Obstetrics;  Laterality: N/A;  EDD: 08/20/17 Allergy: Penicillin   DILATION AND EVACUATION N/A 03/15/2014   Procedure: DILATATION AND EVACUATION;  Surgeon: Lyman Speller, MD;  Location: [facility];  Service: Gynecology;  Laterality: N/A;   LAPAROSCOPY N/A 03/15/2014   Procedure: LAPAROSCOPY OPERATIVE;  Surgeon: Lyman Speller, MD;  Location: [facility];  Service: Gynecology;  Laterality: N/A;   WISDOM TOOTH EXTRACTION      Medical History: Past Medical History:  Diagnosis Date   ADHD    Bell palsy    Insomnia    Postpartum care following repeat cesarean delivery (6/29) 08/12/2015   Seasonal allergies    Sinusitis     Family History: Family History  Problem Relation Age of Onset   Uterine cancer Mother 20   Cancer - Ovarian Maternal Grandmother    Cancer - Lung Maternal Grandmother    Cancer - Lung Maternal Grandfather     Social History   Socioeconomic History   Marital status: Married    Spouse name: Not on file   Number of children: Not on file   Years of education: Not on file   Highest education level: Not on file  Occupational History   Not on file  Tobacco Use   Smoking status: Former    Packs/day: 1.00    Years: 13.00    Total pack years: 13.00    Types: Cigarettes    Quit date: 08/12/2013    Years since quitting: 8.5   Smokeless tobacco: Never  Vaping Use   Vaping Use: Never used   Substance and Sexual Activity   Alcohol use: Yes    Alcohol/week: 5.0 standard drinks of alcohol    Types: 5 Glasses of wine per week    Comment: ocassionally   Drug use: No   Sexual activity: Yes    Partners: Male    Birth control/protection: None    Comment: approx 7-[redacted] wks gestation?? per patient  Other Topics Concern   Not on file  Social History Narrative   Not on file   Social Determinants of Health   Financial Resource Strain: Not on file  Food Insecurity: Not on file  Transportation Needs: Not on file  Physical Activity: Not on file  Stress: Not on file  Social  Connections: Not on file  Intimate Partner Violence: Not on file      Review of Systems  Constitutional:  Negative for activity change, appetite change, chills, fatigue, fever and unexpected weight change.  HENT: Negative.  Negative for congestion, ear pain, rhinorrhea, sore throat and trouble swallowing.   Eyes: Negative.   Respiratory: Negative.  Negative for cough, chest tightness, shortness of breath and wheezing.   Cardiovascular: Negative.  Negative for chest pain and palpitations.  Gastrointestinal: Negative.  Negative for abdominal pain, blood in stool, constipation, diarrhea, nausea and vomiting.  Endocrine: Negative.   Genitourinary: Negative.  Negative for difficulty urinating, dysuria, frequency, hematuria and urgency.  Musculoskeletal: Negative.  Negative for arthralgias, back pain, joint swelling, myalgias and neck pain.  Skin: Negative.  Negative for rash and wound.  Allergic/Immunologic: Negative.  Negative for immunocompromised state.  Neurological: Negative.  Negative for dizziness, seizures, numbness and headaches.  Hematological: Negative.   Psychiatric/Behavioral: Negative.  Negative for behavioral problems, self-injury and suicidal ideas. The patient is not nervous/anxious.     Vital Signs: BP 124/64   Pulse 75   Temp 97.6 F (36.4 C)   Resp 16   Ht '5\' 11"'$  (1.803 m)   Wt 236 lb  6.4 oz (107.2 kg)   SpO2 99%   BMI 32.97 kg/m    Physical Exam Vitals reviewed.  Constitutional:      General: She is not in acute distress.    Appearance: Normal appearance. She is well-developed and well-groomed. She is obese. She is not ill-appearing or diaphoretic.  HENT:     Head: Normocephalic and atraumatic.     Right Ear: Tympanic membrane, ear canal and external ear normal.     Left Ear: Tympanic membrane, ear canal and external ear normal.     Nose: Nose normal. No congestion or rhinorrhea.     Mouth/Throat:     Lips: Pink.     Mouth: Mucous membranes are moist.     Pharynx: Oropharynx is clear. Uvula midline. No oropharyngeal exudate or posterior oropharyngeal erythema.  Eyes:     General: Lids are normal. Vision grossly intact. Gaze aligned appropriately. No scleral icterus.       Right eye: No discharge.        Left eye: No discharge.     Extraocular Movements: Extraocular movements intact.     Conjunctiva/sclera: Conjunctivae normal.     Pupils: Pupils are equal, round, and reactive to light.     Funduscopic exam:    Right eye: Red reflex present.        Left eye: Red reflex present. Neck:     Thyroid: No thyromegaly.     Vascular: No JVD.     Trachea: Trachea and phonation normal. No tracheal deviation.  Cardiovascular:     Rate and Rhythm: Normal rate and regular rhythm.     Pulses: Normal pulses.     Heart sounds: Normal heart sounds, S1 normal and S2 normal. No murmur heard.    No friction rub. No gallop.  Pulmonary:     Effort: Pulmonary effort is normal. No accessory muscle usage or respiratory distress.     Breath sounds: Normal breath sounds and air entry. No stridor. No wheezing or rales.  Chest:     Chest wall: No tenderness.     Comments: Declined breast exam Abdominal:     General: Bowel sounds are normal. There is no distension.     Palpations: Abdomen is soft. There is no  shifting dullness, fluid wave, mass or pulsatile mass.     Tenderness:  There is no abdominal tenderness. There is no guarding or rebound.  Musculoskeletal:        General: No tenderness or deformity. Normal range of motion.     Cervical back: Normal range of motion and neck supple.     Right lower leg: No edema.     Left lower leg: No edema.  Lymphadenopathy:     Cervical: No cervical adenopathy.  Skin:    General: Skin is warm and dry.     Capillary Refill: Capillary refill takes less than 2 seconds.     Coloration: Skin is not pale.     Findings: No erythema or rash.  Neurological:     Mental Status: She is alert and oriented to person, place, and time.     Cranial Nerves: No cranial nerve deficit.     Motor: No abnormal muscle tone.     Coordination: Coordination normal.     Gait: Gait normal.     Deep Tendon Reflexes: Reflexes are normal and symmetric.  Psychiatric:        Mood and Affect: Mood normal.        Behavior: Behavior normal. Behavior is cooperative.        Thought Content: Thought content normal.        Judgment: Judgment normal.        Assessment/Plan: 1. Encounter for routine adult health examination with abnormal findings Age-appropriate preventive screenings and vaccinations discussed, annual physical exam completed. Routine labs for health maintenance ordered, see below. PHM updated.  - CBC with Differential/Platelet - CMP14+EGFR - Lipid Profile - Vitamin D (25 hydroxy) - B12 and Folate Panel  2. Attention and concentration deficit Refills x3 months, follow up in 3 months for additional refills - amphetamine-dextroamphetamine (ADDERALL) 10 MG tablet; Take 1 tablet by mouth in the morning and take 1 tablet by mouth between noon and 2pm.  Dispense: 60 tablet; Refill: 0 - amphetamine-dextroamphetamine (ADDERALL) 10 MG tablet; Take 1 tablet by mouth in the morning and take 1 tablet by mouth between noon and 2pm.  Dispense: 60 tablet; Refill: 0 - amphetamine-dextroamphetamine (ADDERALL) 10 MG tablet; Take 1 tablet by mouth in  the morning and take 1 tablet by mouth between noon and 2pm.  Dispense: 60 tablet; Refill: 0  3. Mixed hyperlipidemia Routine labs ordered - CBC with Differential/Platelet - CMP14+EGFR - Lipid Profile  4. Vitamin D deficiency Routine lab ordered - Vitamin D (25 hydroxy)  5. B12 deficiency Routine labs ordered - CBC with Differential/Platelet - B12 and Folate Panel  6. Dysuria Routine urinalysis done - UA/M w/rflx Culture, Routine     General Counseling: Deanna Sims verbalizes understanding of the findings of todays visit and agrees with plan of treatment. I have discussed any further diagnostic evaluation that may be needed or ordered today. We also reviewed her medications today. she has been encouraged to call the office with any questions or concerns that should arise related to todays visit.    Orders Placed This Encounter  Procedures   CBC with Differential/Platelet   CMP14+EGFR   Lipid Profile   Vitamin D (25 hydroxy)   B12 and Folate Panel   UA/M w/rflx Culture, Routine    Meds ordered this encounter  Medications   amphetamine-dextroamphetamine (ADDERALL) 10 MG tablet    Sig: Take 1 tablet by mouth in the morning and take 1 tablet by mouth between noon and 2pm.  Dispense:  60 tablet    Refill:  0    Fill for 03/16/22; note increased dose and change in number of tablets   amphetamine-dextroamphetamine (ADDERALL) 10 MG tablet    Sig: Take 1 tablet by mouth in the morning and take 1 tablet by mouth between noon and 2pm.    Dispense:  60 tablet    Refill:  0    Fill for 04/13/22   amphetamine-dextroamphetamine (ADDERALL) 10 MG tablet    Sig: Take 1 tablet by mouth in the morning and take 1 tablet by mouth between noon and 2pm.    Dispense:  60 tablet    Refill:  0    Fill for 05/11/22    Return in about 6 months (around 09/14/2022) for F/U, Deanna Sims PCP.   Total time spent:30 Minutes Time spent includes review of chart, medications, test results, and follow up plan  with the patient.   [place] Controlled Substance Database was reviewed by me.  This patient was seen by Jonetta Osgood, FNP-C in collaboration with Dr. Clayborn Bigness as a part of collaborative care agreement.  Amen Dargis R. Valetta Fuller, MSN, FNP-C Internal medicine

## 2022-03-22 LAB — UA/M W/RFLX CULTURE, ROUTINE
Bilirubin, UA: NEGATIVE
Glucose, UA: NEGATIVE
Ketones, UA: NEGATIVE
Nitrite, UA: NEGATIVE
Protein,UA: NEGATIVE
RBC, UA: NEGATIVE
Specific Gravity, UA: 1.022 (ref 1.005–1.030)
Urobilinogen, Ur: 0.2 mg/dL (ref 0.2–1.0)
pH, UA: 7 (ref 5.0–7.5)

## 2022-03-22 LAB — MICROSCOPIC EXAMINATION: Epithelial Cells (non renal): >10 /hpf — AB (ref 0–10)

## 2022-03-22 LAB — URINE CULTURE, REFLEX

## 2022-04-04 DIAGNOSIS — J029 Acute pharyngitis, unspecified: Secondary | ICD-10-CM | POA: Diagnosis not present

## 2022-04-04 DIAGNOSIS — Z6832 Body mass index (BMI) 32.0-32.9, adult: Secondary | ICD-10-CM | POA: Diagnosis not present

## 2022-04-17 ENCOUNTER — Other Ambulatory Visit: Payer: Self-pay

## 2022-04-17 ENCOUNTER — Telehealth: Payer: Self-pay

## 2022-04-17 MED ORDER — AZITHROMYCIN 250 MG PO TABS: ORAL_TABLET | ORAL | 0 refills | Status: AC

## 2022-04-17 NOTE — Telephone Encounter (Signed)
Sent Z-pack for patient per Alyssa. Patient is having sinus infection symptoms for 2 weeks. Advised patient to call back if symptoms do not improve.

## 2022-04-18 DIAGNOSIS — Z6833 Body mass index (BMI) 33.0-33.9, adult: Secondary | ICD-10-CM | POA: Diagnosis not present

## 2022-04-18 DIAGNOSIS — J111 Influenza due to unidentified influenza virus with other respiratory manifestations: Secondary | ICD-10-CM | POA: Diagnosis not present

## 2022-04-18 DIAGNOSIS — M791 Myalgia, unspecified site: Secondary | ICD-10-CM | POA: Diagnosis not present

## 2022-04-18 DIAGNOSIS — R030 Elevated blood-pressure reading, without diagnosis of hypertension: Secondary | ICD-10-CM | POA: Diagnosis not present

## 2022-06-22 DIAGNOSIS — Z1231 Encounter for screening mammogram for malignant neoplasm of breast: Secondary | ICD-10-CM | POA: Diagnosis not present

## 2022-06-22 DIAGNOSIS — R87810 Cervical high risk human papillomavirus (HPV) DNA test positive: Secondary | ICD-10-CM | POA: Diagnosis not present

## 2022-06-22 DIAGNOSIS — Z01419 Encounter for gynecological examination (general) (routine) without abnormal findings: Secondary | ICD-10-CM | POA: Diagnosis not present

## 2022-06-22 DIAGNOSIS — Z124 Encounter for screening for malignant neoplasm of cervix: Secondary | ICD-10-CM | POA: Diagnosis not present

## 2022-07-16 DIAGNOSIS — R87810 Cervical high risk human papillomavirus (HPV) DNA test positive: Secondary | ICD-10-CM | POA: Diagnosis not present

## 2022-07-16 DIAGNOSIS — R87611 Atypical squamous cells cannot exclude high grade squamous intraepithelial lesion on cytologic smear of cervix (ASC-H): Secondary | ICD-10-CM | POA: Diagnosis not present

## 2022-07-23 ENCOUNTER — Ambulatory Visit (INDEPENDENT_AMBULATORY_CARE_PROVIDER_SITE_OTHER): Payer: BC Managed Care – PPO | Admitting: Nurse Practitioner

## 2022-07-23 ENCOUNTER — Encounter: Payer: Self-pay | Admitting: Nurse Practitioner

## 2022-07-23 VITALS — BP 130/70 | HR 78 | Temp 98.5°F | Resp 16 | Ht 71.0 in | Wt 206.4 lb

## 2022-07-23 DIAGNOSIS — G4709 Other insomnia: Secondary | ICD-10-CM | POA: Diagnosis not present

## 2022-07-23 DIAGNOSIS — Z6828 Body mass index (BMI) 28.0-28.9, adult: Secondary | ICD-10-CM

## 2022-07-23 DIAGNOSIS — R41840 Attention and concentration deficit: Secondary | ICD-10-CM | POA: Diagnosis not present

## 2022-07-23 DIAGNOSIS — F321 Major depressive disorder, single episode, moderate: Secondary | ICD-10-CM

## 2022-07-23 DIAGNOSIS — E663 Overweight: Secondary | ICD-10-CM

## 2022-07-23 DIAGNOSIS — Z760 Encounter for issue of repeat prescription: Secondary | ICD-10-CM

## 2022-07-23 MED ORDER — AMPHETAMINE-DEXTROAMPHETAMINE 10 MG PO TABS: ORAL_TABLET | ORAL | 0 refills | Status: DC

## 2022-07-23 MED ORDER — ZOLPIDEM TARTRATE 10 MG PO TABS
ORAL_TABLET | ORAL | 0 refills | Status: DC
Start: 2022-07-23 — End: 2022-10-23

## 2022-07-23 MED ORDER — SERTRALINE HCL 50 MG PO TABS: 50.0000 mg | ORAL_TABLET | Freq: Every day | ORAL | 2 refills | Status: DC

## 2022-07-23 MED ORDER — AMPHETAMINE-DEXTROAMPHETAMINE 10 MG PO TABS
ORAL_TABLET | ORAL | 0 refills | Status: DC
Start: 2022-09-17 — End: 2022-10-23

## 2022-07-23 NOTE — Progress Notes (Signed)
[facility] [address] [place], [place] [address]  Internal MEDICINE  Office Visit Note  Patient Name: Deanna Sims  [id number]  [id number]  Date of Service: 07/23/2022  Chief Complaint  Patient presents with   Follow-up    Med refills.    HPI Deanna Sims presents for a follow-up visit for ADHD, insomnia, and weight loss.  ADHD -- current dose is effective. Heart rate and BP are normal. Denies any palpitations or other adverse side effects of the medication Insomnia -- takes ambien, current dose is effective, no issues.  Weight loss -- getting tirzepatide from a compound pharmacy. Has lost 30 lbs since February.     Current Medication: Outpatient Encounter Medications as of 07/23/2022  Medication Sig   cetirizine (ZYRTEC) 10 MG tablet Take 1 tablet (10 mg total) by mouth daily.   fluticasone (FLONASE) 50 MCG/ACT nasal spray Place 1 spray into both nostrils daily as needed for allergies or rhinitis.   ibuprofen (ADVIL) 600 MG tablet Take 1 tablet (600 mg total) by mouth every 6 (six) hours as needed for cramping.   ondansetron (ZOFRAN) 4 MG tablet Take 1 tablet (4 mg total) by mouth every 8 (eight) hours as needed for nausea or vomiting.   SPRINTEC 28 0.25-35 MG-MCG tablet Take 1 tablet by mouth daily.   [DISCONTINUED] amphetamine-dextroamphetamine (ADDERALL) 10 MG tablet Take 1 tablet by mouth in the morning and take 1 tablet by mouth between noon and 2pm.   [DISCONTINUED] amphetamine-dextroamphetamine (ADDERALL) 10 MG tablet Take 1 tablet by mouth in the morning and take 1 tablet by mouth between noon and 2pm.   [DISCONTINUED] amphetamine-dextroamphetamine (ADDERALL) 10 MG tablet Take 1 tablet by mouth in the morning and take 1 tablet by mouth between noon and 2pm.   [DISCONTINUED] sertraline (ZOLOFT) 50 MG tablet Take 1 tablet (50 mg total) by mouth daily.   [DISCONTINUED] Tirzepatide-Weight Management (ZEPBOUND) 5 MG/0.5ML SOAJ Inject 5 mg into the skin once a week.    [DISCONTINUED] zolpidem (AMBIEN) 10 MG tablet Take one tab po qhs as needed, may take one extra if needed   amphetamine-dextroamphetamine (ADDERALL) 10 MG tablet Take 1 tablet by mouth in the morning and take 1 tablet by mouth between noon and 2pm.   [START ON 08/20/2022] amphetamine-dextroamphetamine (ADDERALL) 10 MG tablet Take 1 tablet by mouth in the morning and take 1 tablet by mouth between noon and 2pm.   [START ON 09/17/2022] amphetamine-dextroamphetamine (ADDERALL) 10 MG tablet Take 1 tablet by mouth in the morning and take 1 tablet by mouth between noon and 2pm.   sertraline (ZOLOFT) 50 MG tablet Take 1 tablet (50 mg total) by mouth daily.   zolpidem (AMBIEN) 10 MG tablet Take one tab po qhs as needed, may take one extra if needed   No facility-administered encounter medications on file as of 07/23/2022.    Surgical History: Past Surgical History:  Procedure Laterality Date   CESAREAN SECTION N/A 08/11/2015   Procedure: Primary CESAREAN SECTION;  Surgeon: Olivia Mackie, MD;  Location: [facility] BIRTHING SUITES;  Service: Obstetrics;  Laterality: N/A;  EDD: 08/17/15 Allergy: PCN   CESAREAN SECTION N/A 08/14/2017   Procedure: Repeat CESAREAN SECTION;  Surgeon: Olivia Mackie, MD;  Location: [facility] BIRTHING SUITES;  Service: Obstetrics;  Laterality: N/A;  EDD: 08/20/17 Allergy: Penicillin   DILATION AND EVACUATION N/A 03/15/2014   Procedure: DILATATION AND EVACUATION;  Surgeon: Annamaria Boots, MD;  Location: [facility];  Service: Gynecology;  Laterality: N/A;   LAPAROSCOPY N/A 03/15/2014  Procedure: LAPAROSCOPY OPERATIVE;  Surgeon: Annamaria Boots, MD;  Location: [facility];  Service: Gynecology;  Laterality: N/A;   WISDOM TOOTH EXTRACTION      Medical History: Past Medical History:  Diagnosis Date   ADHD    Bell palsy    Insomnia    Postpartum care following repeat cesarean delivery (6/29) 08/12/2015   Seasonal allergies    Sinusitis     Family History: Family History  Problem Relation Age of  Onset   Uterine cancer Mother 34   Cancer - Ovarian Maternal Grandmother    Cancer - Lung Maternal Grandmother    Cancer - Lung Maternal Grandfather     Social History   Socioeconomic History   Marital status: Married    Spouse name: Not on file   Number of children: Not on file   Years of education: Not on file   Highest education level: Not on file  Occupational History   Not on file  Tobacco Use   Smoking status: Former    Packs/day: 1.00    Years: 13.00    Additional pack years: 0.00    Total pack years: 13.00    Types: Cigarettes    Quit date: 08/12/2013    Years since quitting: 8.9   Smokeless tobacco: Never  Vaping Use   Vaping Use: Never used  Substance and Sexual Activity   Alcohol use: Yes    Alcohol/week: 5.0 standard drinks of alcohol    Types: 5 Glasses of wine per week    Comment: ocassionally   Drug use: No   Sexual activity: Yes    Partners: Male    Birth control/protection: None    Comment: approx 7-[redacted] wks gestation?? per patient  Other Topics Concern   Not on file  Social History Narrative   Not on file   Social Determinants of Health   Financial Resource Strain: Not on file  Food Insecurity: Not on file  Transportation Needs: Not on file  Physical Activity: Not on file  Stress: Not on file  Social Connections: Not on file  Intimate Partner Violence: Not on file      Review of Systems  Constitutional:  Positive for fatigue. Negative for chills and unexpected weight change.  HENT:  Negative for congestion, postnasal drip, rhinorrhea, sneezing and sore throat.   Eyes:  Negative for redness.  Respiratory: Negative.  Negative for cough, chest tightness, shortness of breath and wheezing.   Cardiovascular: Negative.  Negative for chest pain and palpitations.  Gastrointestinal:  Negative for abdominal pain, constipation, diarrhea, nausea and vomiting.  Genitourinary:  Negative for dysuria and frequency.  Musculoskeletal:  Negative for  arthralgias, back pain, joint swelling and neck pain.  Skin:  Negative for rash.  Neurological: Negative.  Negative for tremors and numbness.  Hematological:  Negative for adenopathy. Does not bruise/bleed easily.  Psychiatric/Behavioral:  Positive for behavioral problems (Depression), decreased concentration and sleep disturbance. Negative for self-injury and suicidal ideas. The patient is not nervous/anxious.     Vital Signs: BP 130/70   Pulse 78   Temp 98.5 F (36.9 C)   Resp 16   Ht 5\' 11"  (1.803 m)   Wt 206 lb 6.4 oz (93.6 kg)   SpO2 95%   BMI 28.79 kg/m    Physical Exam Vitals reviewed.  Constitutional:      Appearance: Normal appearance.  HENT:     Head: Normocephalic and atraumatic.     Mouth/Throat:     Pharynx: No posterior  oropharyngeal erythema.  Eyes:     Extraocular Movements: Extraocular movements intact.     Pupils: Pupils are equal, round, and reactive to light.  Cardiovascular:     Rate and Rhythm: Normal rate and regular rhythm.     Pulses: Normal pulses.  Pulmonary:     Effort: Pulmonary effort is normal.  Neurological:     Mental Status: She is alert and oriented to person, place, and time.  Psychiatric:        Mood and Affect: Mood normal.        Behavior: Behavior normal.     Comments:          Assessment/Plan: 1. Other insomnia Continue ambien as prescribed, follow up in 3 months for refills - zolpidem (AMBIEN) 10 MG tablet; Take one tab po qhs as needed, may take one extra if needed  Dispense: 30 tablet; Refill: 0  2. Attention and concentration deficit Continue adderall as prescribed, follow up in 3 months for additional refills  - amphetamine-dextroamphetamine (ADDERALL) 10 MG tablet; Take 1 tablet by mouth in the morning and take 1 tablet by mouth between noon and 2pm.  Dispense: 60 tablet; Refill: 0 - amphetamine-dextroamphetamine (ADDERALL) 10 MG tablet; Take 1 tablet by mouth in the morning and take 1 tablet by mouth between noon  and 2pm.  Dispense: 60 tablet; Refill: 0 - amphetamine-dextroamphetamine (ADDERALL) 10 MG tablet; Take 1 tablet by mouth in the morning and take 1 tablet by mouth between noon and 2pm.  Dispense: 60 tablet; Refill: 0  3. Overweight with body mass index (BMI) of 28 to 28.9 in adult Continue tirzepatide treatment with compound pharmacy.   4. Current moderate episode of major depressive disorder without prior episode (HCC) Continue sertraline as prescribed.  - sertraline (ZOLOFT) 50 MG tablet; Take 1 tablet (50 mg total) by mouth daily.  Dispense: 90 tablet; Refill: 2   General Counseling: Sahory verbalizes understanding of the findings of todays visit and agrees with plan of treatment. I have discussed any further diagnostic evaluation that may be needed or ordered today. We also reviewed her medications today. she has been encouraged to call the office with any questions or concerns that should arise related to todays visit.    No orders of the defined types were placed in this encounter.   Meds ordered this encounter  Medications   amphetamine-dextroamphetamine (ADDERALL) 10 MG tablet    Sig: Take 1 tablet by mouth in the morning and take 1 tablet by mouth between noon and 2pm.    Dispense:  60 tablet    Refill:  0    Fill for 04/13/22   amphetamine-dextroamphetamine (ADDERALL) 10 MG tablet    Sig: Take 1 tablet by mouth in the morning and take 1 tablet by mouth between noon and 2pm.    Dispense:  60 tablet    Refill:  0    Fill for july   amphetamine-dextroamphetamine (ADDERALL) 10 MG tablet    Sig: Take 1 tablet by mouth in the morning and take 1 tablet by mouth between noon and 2pm.    Dispense:  60 tablet    Refill:  0    Fill for august   zolpidem (AMBIEN) 10 MG tablet    Sig: Take one tab po qhs as needed, may take one extra if needed    Dispense:  30 tablet    Refill:  0   sertraline (ZOLOFT) 50 MG tablet    Sig: Take 1 tablet (  50 mg total) by mouth daily.    Dispense:   90 tablet    Refill:  2    Return in about 3 months (around 10/16/2022) for F/U, ADHD med check, Vaani Morren PCP.   Total time spent:30 Minutes Time spent includes review of chart, medications, test results, and follow up plan with the patient.   [place] Controlled Substance Database was reviewed by me.  This patient was seen by Sallyanne Kuster, FNP-C in collaboration with Dr. Beverely Risen as a part of collaborative care agreement.   Jacobe Study R. Tedd Sias, MSN, FNP-C Internal medicine

## 2022-07-27 ENCOUNTER — Encounter: Payer: Self-pay | Admitting: Nurse Practitioner

## 2022-08-08 DIAGNOSIS — R87611 Atypical squamous cells cannot exclude high grade squamous intraepithelial lesion on cytologic smear of cervix (ASC-H): Secondary | ICD-10-CM | POA: Diagnosis not present

## 2022-08-08 DIAGNOSIS — R87613 High grade squamous intraepithelial lesion on cytologic smear of cervix (HGSIL): Secondary | ICD-10-CM | POA: Diagnosis not present

## 2022-08-27 DIAGNOSIS — R87613 High grade squamous intraepithelial lesion on cytologic smear of cervix (HGSIL): Secondary | ICD-10-CM | POA: Diagnosis not present

## 2022-10-23 ENCOUNTER — Encounter: Payer: Self-pay | Admitting: Nurse Practitioner

## 2022-10-23 ENCOUNTER — Ambulatory Visit (INDEPENDENT_AMBULATORY_CARE_PROVIDER_SITE_OTHER): Payer: BC Managed Care – PPO | Admitting: Nurse Practitioner

## 2022-10-23 ENCOUNTER — Ambulatory Visit: Payer: BC Managed Care – PPO | Admitting: Nurse Practitioner

## 2022-10-23 VITALS — BP 120/78 | HR 88 | Temp 98.6°F | Resp 16 | Ht 71.0 in | Wt 193.2 lb

## 2022-10-23 DIAGNOSIS — G4709 Other insomnia: Secondary | ICD-10-CM | POA: Diagnosis not present

## 2022-10-23 DIAGNOSIS — Z79899 Other long term (current) drug therapy: Secondary | ICD-10-CM

## 2022-10-23 DIAGNOSIS — R41840 Attention and concentration deficit: Secondary | ICD-10-CM | POA: Diagnosis not present

## 2022-10-23 DIAGNOSIS — R109 Unspecified abdominal pain: Secondary | ICD-10-CM | POA: Diagnosis not present

## 2022-10-23 LAB — POCT URINE DRUG SCREEN
Methylenedioxyamphetamine: NOT DETECTED
POC Amphetamine UR: POSITIVE — AB
POC BENZODIAZEPINES UR: NOT DETECTED
POC Barbiturate UR: NOT DETECTED
POC Cocaine UR: NOT DETECTED
POC Ecstasy UR: NOT DETECTED
POC Marijuana UR: NOT DETECTED
POC Methadone UR: NOT DETECTED
POC Methamphetamine UR: NOT DETECTED
POC Opiate Ur: NOT DETECTED
POC Oxycodone UR: NOT DETECTED
POC PHENCYCLIDINE UR: NOT DETECTED
POC TRICYCLICS UR: NOT DETECTED

## 2022-10-23 MED ORDER — AMPHETAMINE-DEXTROAMPHETAMINE 10 MG PO TABS
ORAL_TABLET | ORAL | 0 refills | Status: DC
Start: 2022-10-23 — End: 2023-02-12

## 2022-10-23 MED ORDER — ZOLPIDEM TARTRATE 10 MG PO TABS
ORAL_TABLET | ORAL | 2 refills | Status: DC
Start: 2022-10-23 — End: 2023-02-12

## 2022-10-23 MED ORDER — IBUPROFEN 800 MG PO TABS
800.0000 mg | ORAL_TABLET | Freq: Three times a day (TID) | ORAL | 0 refills | Status: DC | PRN
Start: 2022-10-23 — End: 2022-11-29

## 2022-10-23 MED ORDER — AMPHETAMINE-DEXTROAMPHETAMINE 10 MG PO TABS
ORAL_TABLET | ORAL | 0 refills | Status: DC
Start: 2022-11-20 — End: 2023-02-12

## 2022-10-23 MED ORDER — AMPHETAMINE-DEXTROAMPHETAMINE 10 MG PO TABS
ORAL_TABLET | ORAL | 0 refills | Status: DC
Start: 2022-12-18 — End: 2023-02-12

## 2022-10-23 NOTE — Progress Notes (Unsigned)
[facility] [address] [place], [place] [address]  Internal MEDICINE  Office Visit Note  Patient Name: Deanna Sims  [id number]  [id number]  Date of Service: 10/23/2022  Chief Complaint  Patient presents with   Follow-up    Med refill.     HPI Deanna Sims presents for a follow-up visit for  Adhd Insomnia Cramping occasionally    Current Medication: Outpatient Encounter Medications as of 10/23/2022  Medication Sig   cetirizine (ZYRTEC) 10 MG tablet Take 1 tablet (10 mg total) by mouth daily.   fluticasone (FLONASE) 50 MCG/ACT nasal spray Place 1 spray into both nostrils daily as needed for allergies or rhinitis.   ibuprofen (ADVIL) 800 MG tablet Take 1 tablet (800 mg total) by mouth every 8 (eight) hours as needed for mild pain, moderate pain or cramping.   ondansetron (ZOFRAN) 4 MG tablet Take 1 tablet (4 mg total) by mouth every 8 (eight) hours as needed for nausea or vomiting.   sertraline (ZOLOFT) 50 MG tablet Take 1 tablet (50 mg total) by mouth daily.   SPRINTEC 28 0.25-35 MG-MCG tablet Take 1 tablet by mouth daily.   [DISCONTINUED] amphetamine-dextroamphetamine (ADDERALL) 10 MG tablet Take 1 tablet by mouth in the morning and take 1 tablet by mouth between noon and 2pm.   [DISCONTINUED] amphetamine-dextroamphetamine (ADDERALL) 10 MG tablet Take 1 tablet by mouth in the morning and take 1 tablet by mouth between noon and 2pm.   [DISCONTINUED] amphetamine-dextroamphetamine (ADDERALL) 10 MG tablet Take 1 tablet by mouth in the morning and take 1 tablet by mouth between noon and 2pm.   [DISCONTINUED] ibuprofen (ADVIL) 600 MG tablet Take 1 tablet (600 mg total) by mouth every 6 (six) hours as needed for cramping.   [DISCONTINUED] zolpidem (AMBIEN) 10 MG tablet Take one tab po qhs as needed, may take one extra if needed   [START ON 12/18/2022] amphetamine-dextroamphetamine (ADDERALL) 10 MG tablet Take 1 tablet by mouth in the morning and take 1 tablet by mouth between  noon and 2pm.   [START ON 11/20/2022] amphetamine-dextroamphetamine (ADDERALL) 10 MG tablet Take 1 tablet by mouth in the morning and take 1 tablet by mouth between noon and 2pm.   amphetamine-dextroamphetamine (ADDERALL) 10 MG tablet Take 1 tablet by mouth in the morning and take 1 tablet by mouth between noon and 2pm.   zolpidem (AMBIEN) 10 MG tablet Take one tab po qhs as needed, may take one extra if needed   No facility-administered encounter medications on file as of 10/23/2022.    Surgical History: Past Surgical History:  Procedure Laterality Date   CESAREAN SECTION N/A 08/11/2015   Procedure: Primary CESAREAN SECTION;  Surgeon: Olivia Mackie, MD;  Location: [facility] BIRTHING SUITES;  Service: Obstetrics;  Laterality: N/A;  EDD: 08/17/15 Allergy: PCN   CESAREAN SECTION N/A 08/14/2017   Procedure: Repeat CESAREAN SECTION;  Surgeon: Olivia Mackie, MD;  Location: [facility] BIRTHING SUITES;  Service: Obstetrics;  Laterality: N/A;  EDD: 08/20/17 Allergy: Penicillin   DILATION AND EVACUATION N/A 03/15/2014   Procedure: DILATATION AND EVACUATION;  Surgeon: Annamaria Boots, MD;  Location: [facility];  Service: Gynecology;  Laterality: N/A;   LAPAROSCOPY N/A 03/15/2014   Procedure: LAPAROSCOPY OPERATIVE;  Surgeon: Annamaria Boots, MD;  Location: [facility];  Service: Gynecology;  Laterality: N/A;   WISDOM TOOTH EXTRACTION      Medical History: Past Medical History:  Diagnosis Date   ADHD    Bell palsy    Insomnia    Postpartum care  following repeat cesarean delivery (6/29) 08/12/2015   Seasonal allergies    Sinusitis     Family History: Family History  Problem Relation Age of Onset   Uterine cancer Mother 93   Cancer - Ovarian Maternal Grandmother    Cancer - Lung Maternal Grandmother    Cancer - Lung Maternal Grandfather     Social History   Socioeconomic History   Marital status: Married    Spouse name: Not on file   Number of children: Not on file   Years of education: Not on file    Highest education level: Not on file  Occupational History   Not on file  Tobacco Use   Smoking status: Former    Current packs/day: 0.00    Average packs/day: 1 pack/day for 13.0 years (13.0 ttl pk-yrs)    Types: Cigarettes    Start date: 08/12/2000    Quit date: 08/12/2013    Years since quitting: 9.2   Smokeless tobacco: Never  Vaping Use   Vaping status: Never Used  Substance and Sexual Activity   Alcohol use: Yes    Alcohol/week: 5.0 standard drinks of alcohol    Types: 5 Glasses of wine per week    Comment: ocassionally   Drug use: No   Sexual activity: Yes    Partners: Male    Birth control/protection: None    Comment: approx 7-[redacted] wks gestation?? per patient  Other Topics Concern   Not on file  Social History Narrative   Not on file   Social Determinants of Health   Financial Resource Strain: Not on file  Food Insecurity: Not on file  Transportation Needs: Not on file  Physical Activity: Not on file  Stress: Not on file  Social Connections: Not on file  Intimate Partner Violence: Not on file      Review of Systems  Vital Signs: BP 120/78   Pulse 88   Temp 98.6 F (37 C)   Resp 16   Ht 5\' 11"  (1.803 m)   Wt 193 lb 3.2 oz (87.6 kg)   SpO2 99%   BMI 26.95 kg/m    Physical Exam     Assessment/Plan:   General Counseling: Deanna Sims verbalizes understanding of the findings of todays visit and agrees with plan of treatment. I have discussed any further diagnostic evaluation that may be needed or ordered today. We also reviewed her medications today. she has been encouraged to call the office with any questions or concerns that should arise related to todays visit.    Orders Placed This Encounter  Procedures   POCT Urine Drug Screen    Meds ordered this encounter  Medications   zolpidem (AMBIEN) 10 MG tablet    Sig: Take one tab po qhs as needed, may take one extra if needed    Dispense:  30 tablet    Refill:  2   amphetamine-dextroamphetamine  (ADDERALL) 10 MG tablet    Sig: Take 1 tablet by mouth in the morning and take 1 tablet by mouth between noon and 2pm.    Dispense:  60 tablet    Refill:  0    Fill for November   amphetamine-dextroamphetamine (ADDERALL) 10 MG tablet    Sig: Take 1 tablet by mouth in the morning and take 1 tablet by mouth between noon and 2pm.    Dispense:  60 tablet    Refill:  0    Fill for october   amphetamine-dextroamphetamine (ADDERALL) 10 MG tablet  Sig: Take 1 tablet by mouth in the morning and take 1 tablet by mouth between noon and 2pm.    Dispense:  60 tablet    Refill:  0    Fill for September   ibuprofen (ADVIL) 800 MG tablet    Sig: Take 1 tablet (800 mg total) by mouth every 8 (eight) hours as needed for mild pain, moderate pain or cramping.    Dispense:  90 tablet    Refill:  0    Return in about 12 weeks (around 01/15/2023) for F/U, ADHD med check, Teletha Petrea PCP adderall and ambien.   Total time spent:*** Minutes Time spent includes review of chart, medications, test results, and follow up plan with the patient.   [place] Controlled Substance Database was reviewed by me.  This patient was seen by Sallyanne Kuster, FNP-C in collaboration with Dr. Beverely Risen as a part of collaborative care agreement.   Eldredge Veldhuizen R. Tedd Sias, MSN, FNP-C Internal medicine

## 2022-10-24 ENCOUNTER — Encounter: Payer: Self-pay | Admitting: Nurse Practitioner

## 2022-10-29 ENCOUNTER — Telehealth (INDEPENDENT_AMBULATORY_CARE_PROVIDER_SITE_OTHER): Payer: BC Managed Care – PPO | Admitting: Nurse Practitioner

## 2022-10-29 ENCOUNTER — Encounter: Payer: Self-pay | Admitting: Nurse Practitioner

## 2022-10-29 VITALS — Ht 70.0 in | Wt 192.0 lb

## 2022-10-29 DIAGNOSIS — U071 COVID-19: Secondary | ICD-10-CM | POA: Diagnosis not present

## 2022-10-29 DIAGNOSIS — J069 Acute upper respiratory infection, unspecified: Secondary | ICD-10-CM

## 2022-10-29 NOTE — Progress Notes (Signed)
[facility] [address] [place], [place] [address]  Internal MEDICINE  Telephone Visit  Patient Name: Deanna Sims  [id number]  [id number]  Date of Service: 10/29/2022  I connected with the patient at 1315 by telephone and verified the patients identity using two identifiers.   I discussed the limitations, risks, security and privacy concerns of performing an evaluation and management service by telephone and the availability of in person appointments. I also discussed with the patient that there may be a patient responsible charge related to the service.  The patient expressed understanding and agrees to proceed.    Chief Complaint  Patient presents with   Telephone Assessment   Telephone Screen    Covid test positive   Sore Throat   Sinusitis   Ear Pain    Sore Throat  Associated symptoms include ear pain and headaches. Pertinent negatives include no coughing or shortness of breath.  Sinusitis Associated symptoms include ear pain, headaches, sinus pressure and a sore throat. Pertinent negatives include no coughing or shortness of breath.   Deanna Sims presents for a telehealth virtual visit forcovid positive URI.  Positive for covid today Symptoms started over the weekend Reports sore throat, ear pain, sinus pressure and sinus drainage, and fatigue   Current Medication: Outpatient Encounter Medications as of 10/29/2022  Medication Sig   [START ON 12/18/2022] amphetamine-dextroamphetamine (ADDERALL) 10 MG tablet Take 1 tablet by mouth in the morning and take 1 tablet by mouth between noon and 2pm.   [START ON 11/20/2022] amphetamine-dextroamphetamine (ADDERALL) 10 MG tablet Take 1 tablet by mouth in the morning and take 1 tablet by mouth between noon and 2pm.   amphetamine-dextroamphetamine (ADDERALL) 10 MG tablet Take 1 tablet by mouth in the morning and take 1 tablet by mouth between noon and 2pm.   cetirizine (ZYRTEC) 10 MG tablet Take 1 tablet (10 mg total) by mouth  daily.   fluticasone (FLONASE) 50 MCG/ACT nasal spray Place 1 spray into both nostrils daily as needed for allergies or rhinitis.   ibuprofen (ADVIL) 800 MG tablet Take 1 tablet (800 mg total) by mouth every 8 (eight) hours as needed for mild pain, moderate pain or cramping.   ondansetron (ZOFRAN) 4 MG tablet Take 1 tablet (4 mg total) by mouth every 8 (eight) hours as needed for nausea or vomiting.   sertraline (ZOLOFT) 50 MG tablet Take 1 tablet (50 mg total) by mouth daily.   SPRINTEC 28 0.25-35 MG-MCG tablet Take 1 tablet by mouth daily.   zolpidem (AMBIEN) 10 MG tablet Take one tab po qhs as needed, may take one extra if needed   No facility-administered encounter medications on file as of 10/29/2022.    Surgical History: Past Surgical History:  Procedure Laterality Date   CESAREAN SECTION N/A 08/11/2015   Procedure: Primary CESAREAN SECTION;  Surgeon: Olivia Mackie, MD;  Location: [facility] BIRTHING SUITES;  Service: Obstetrics;  Laterality: N/A;  EDD: 08/17/15 Allergy: PCN   CESAREAN SECTION N/A 08/14/2017   Procedure: Repeat CESAREAN SECTION;  Surgeon: Olivia Mackie, MD;  Location: [facility] BIRTHING SUITES;  Service: Obstetrics;  Laterality: N/A;  EDD: 08/20/17 Allergy: Penicillin   DILATION AND EVACUATION N/A 03/15/2014   Procedure: DILATATION AND EVACUATION;  Surgeon: Annamaria Boots, MD;  Location: [facility] ORS;  Service: Gynecology;  Laterality: N/A;   LAPAROSCOPY N/A 03/15/2014   Procedure: LAPAROSCOPY OPERATIVE;  Surgeon: Annamaria Boots, MD;  Location: [facility] ORS;  Service: Gynecology;  Laterality: N/A;   WISDOM TOOTH EXTRACTION  Medical History: Past Medical History:  Diagnosis Date   ADHD    Bell palsy    Insomnia    Postpartum care following repeat cesarean delivery (6/29) 08/12/2015   Seasonal allergies    Sinusitis     Family History: Family History  Problem Relation Age of Onset   Uterine cancer Mother 58   Cancer - Ovarian Maternal Grandmother    Cancer - Lung Maternal  Grandmother    Cancer - Lung Maternal Grandfather     Social History   Socioeconomic History   Marital status: Married    Spouse name: Not on file   Number of children: Not on file   Years of education: Not on file   Highest education level: Not on file  Occupational History   Not on file  Tobacco Use   Smoking status: Former    Current packs/day: 0.00    Average packs/day: 1 pack/day for 13.0 years (13.0 ttl pk-yrs)    Types: Cigarettes    Start date: 08/12/2000    Quit date: 08/12/2013    Years since quitting: 9.2   Smokeless tobacco: Never  Vaping Use   Vaping status: Never Used  Substance and Sexual Activity   Alcohol use: Yes    Alcohol/week: 5.0 standard drinks of alcohol    Types: 5 Glasses of wine per week    Comment: ocassionally   Drug use: No   Sexual activity: Yes    Partners: Male    Birth control/protection: None    Comment: approx 7-[redacted] wks gestation?? per patient  Other Topics Concern   Not on file  Social History Narrative   Not on file   Social Determinants of Health   Financial Resource Strain: Not on file  Food Insecurity: Not on file  Transportation Needs: Not on file  Physical Activity: Not on file  Stress: Not on file  Social Connections: Not on file  Intimate Partner Violence: Not on file      Review of Systems  Constitutional:  Positive for fatigue.  HENT:  Positive for ear pain, postnasal drip, sinus pressure, sinus pain and sore throat.   Respiratory: Negative.  Negative for cough, chest tightness, shortness of breath and wheezing.   Cardiovascular: Negative.  Negative for chest pain and palpitations.  Neurological:  Positive for headaches.    Vital Signs: Ht 5\' 10"  (1.778 m)   Wt 192 lb (87.1 kg)   BMI 27.55 kg/m    Observation/Objective: She is alert and oriented. No acute distress noted.     Assessment/Plan: 1. Upper respiratory tract infection due to COVID-19 virus Discussed OTC symptomatic treatment. Call clinic if  symptoms do not improve after 5 days or worsen.    General Counseling: Beulah verbalizes understanding of the findings of today's phone visit and agrees with plan of treatment. I have discussed any further diagnostic evaluation that may be needed or ordered today. We also reviewed her medications today. she has been encouraged to call the office with any questions or concerns that should arise related to todays visit.  Return if symptoms worsen or fail to improve.   No orders of the defined types were placed in this encounter.   No orders of the defined types were placed in this encounter.   Time spent:10 Minutes Time spent with patient included reviewing progress notes, labs, imaging studies, and discussing plan for follow up.  [place] Controlled Substance Database was reviewed by me for overdose risk score (ORS) if appropriate.  This  patient was seen by Sallyanne Kuster, FNP-C in collaboration with Dr. Beverely Risen as a part of collaborative care agreement.  Ceil Roderick R. Tedd Sias, MSN, FNP-C Internal medicine

## 2022-11-10 IMAGING — MR MR HEAD W/O CM
14 series · 44 of 48 positions shown · non-contrast
Comparison: None.

CLINICAL DATA: Chronic headache. History of Bell's palsy. Visual
disturbance. Tinnitus on the left.

EXAM:
MRI HEAD WITHOUT CONTRAST
TECHNIQUE: Multiplanar, multiecho pulse sequences of the brain and surrounding
structures were obtained without intravenous contrast.

[Series 5: ax dwi_tracew · axial · 3.0mm · 0.60mm/px · z∈[-85,+67]mm · 6 of 96 slices shown]
[im 1/96]
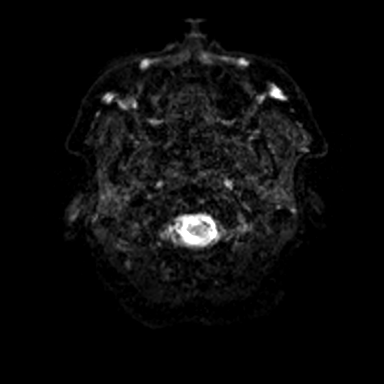
[im 20/96]
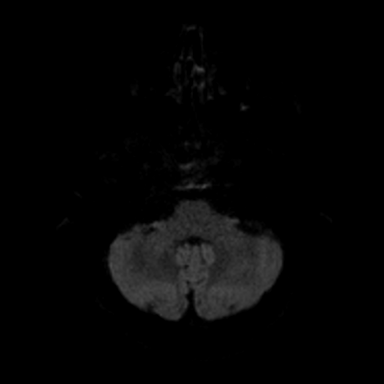
[im 39/96]
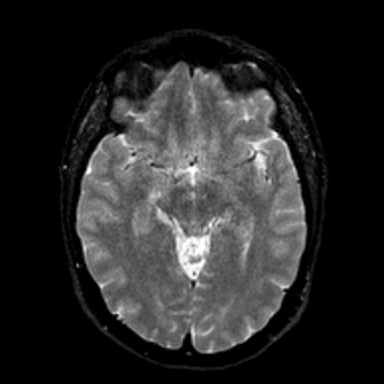
[im 58/96]
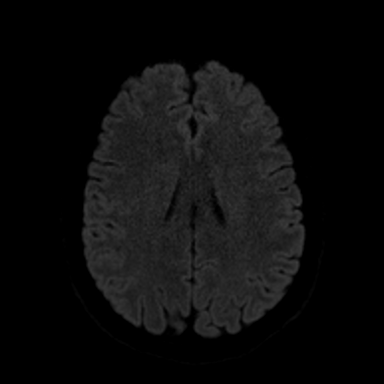
[im 77/96]
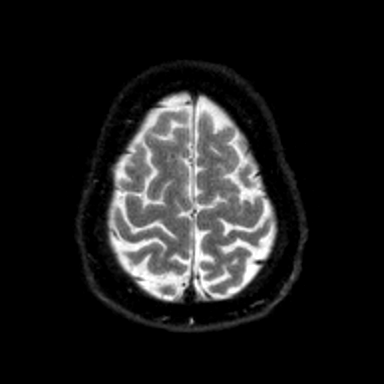
[im 96/96]
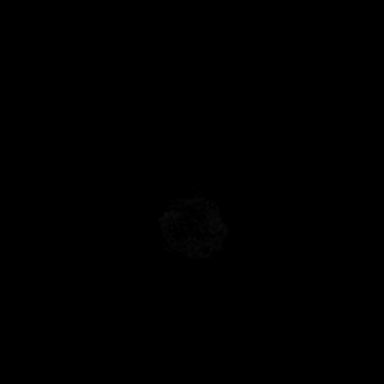

[Series 6: ax dwi_adc · axial · 3.0mm · 0.60mm/px · z∈[-85,+67]mm · 4 of 48 slices shown]
[im 1/48]
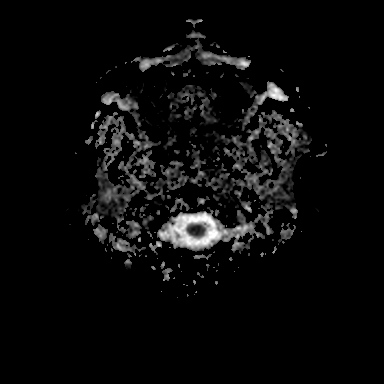
[im 16/48]
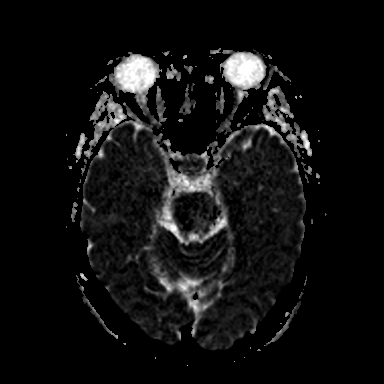
[im 32/48]
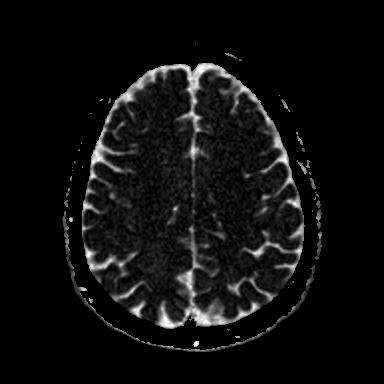
[im 48/48]
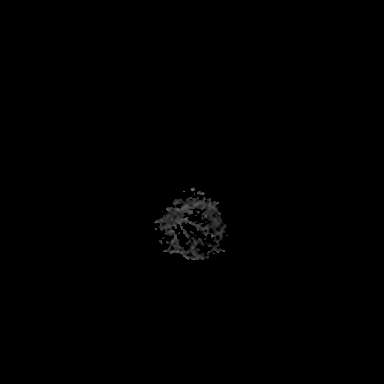

[Series 7: cor dwi_tracew · coronal · 5.0mm · 0.68mm/px · 4 of 80 slices shown]
[im 1/80]
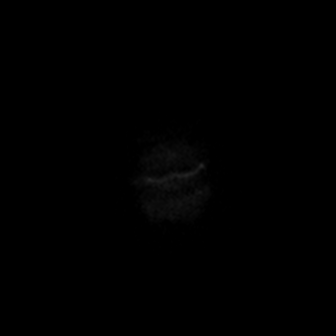
[im 27/80]
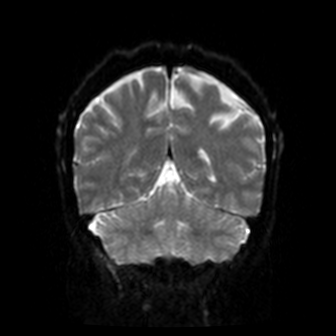
[im 53/80]
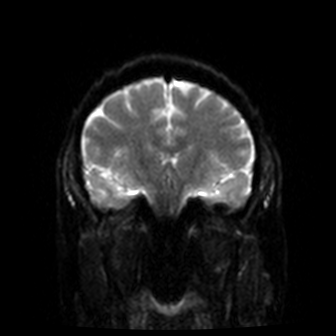
[im 80/80]
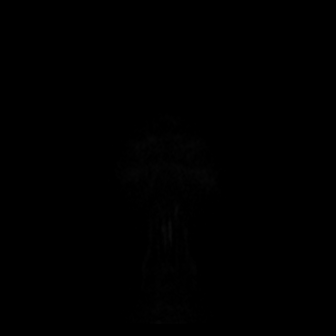

[Series 8: cor dwi_adc · coronal · 5.0mm · 0.68mm/px · 2 of 40 slices shown]
[im 1/40]
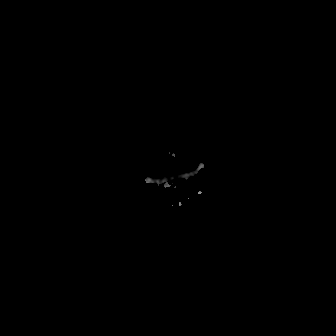
[im 40/40]
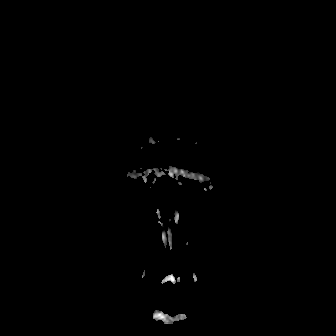

[Series 9: T1 · sagittal · 5.0mm · 0.62mm/px · 1 of 24 slices shown (1 of 2)]
[im 1/24]
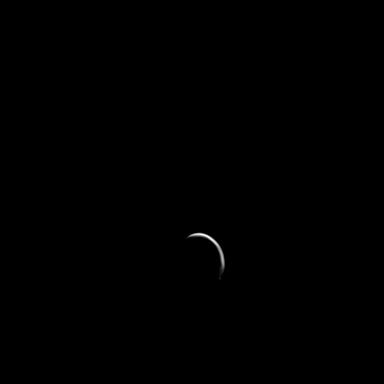

[Series 10: T2 · axial · 5.0mm · 0.53mm/px · 1 of 25 slices shown (1 of 2)]
[im 1/25]
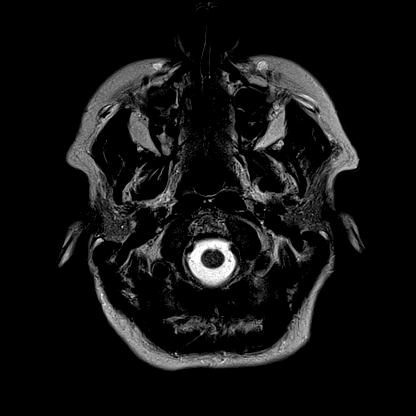

[Series 11: mag_images · axial · 3.0mm · 0.90mm/px · z∈[-94,+79]mm · 3 of 60 slices shown]
[im 1/60]
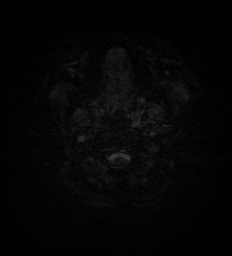
[im 30/60]
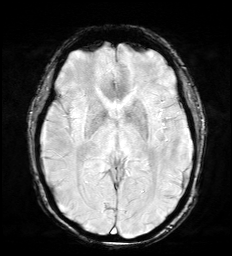
[im 60/60]
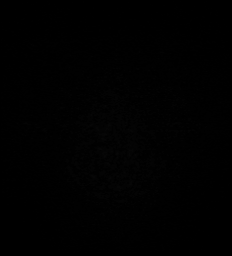

[Series 12: pha_images · axial · 3.0mm · 0.90mm/px · z∈[-94,+79]mm · 3 of 58 slices shown]
[im 1/58]
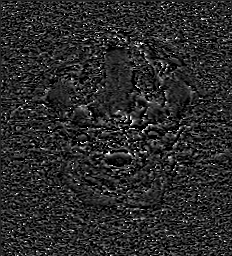
[im 29/58]
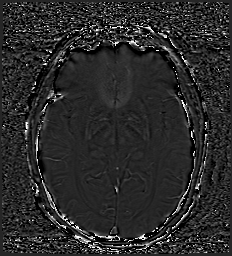
[im 58/58]
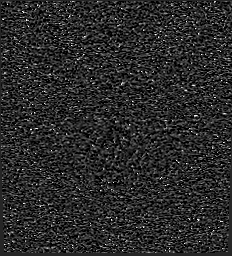

[Series 13: swi_images · axial · 3.0mm · 0.90mm/px · z∈[-94,+79]mm · 3 of 60 slices shown]
[im 1/60]
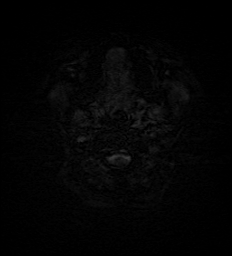
[im 30/60]
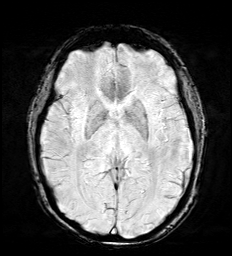
[im 60/60]
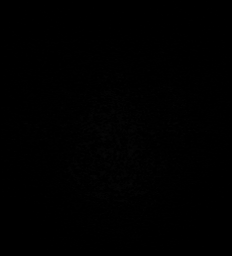

[Series 14: mip_images(sw) · axial · 24.0mm · 0.90mm/px · z∈[-84,+69]mm · 3 of 53 slices shown]
[im 1/53]
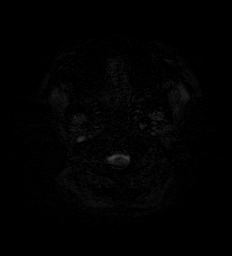
[im 27/53]
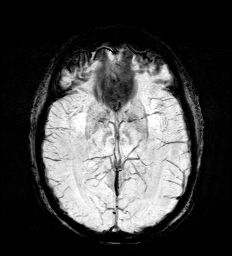
[im 53/53]
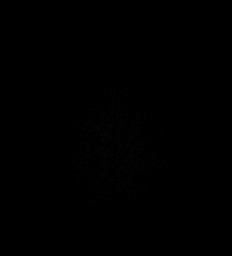

[Series 15: FLAIR · axial · 3.0mm · 0.53mm/px · z∈[-88,+71]mm · 3 of 55 slices shown]
[im 1/55]
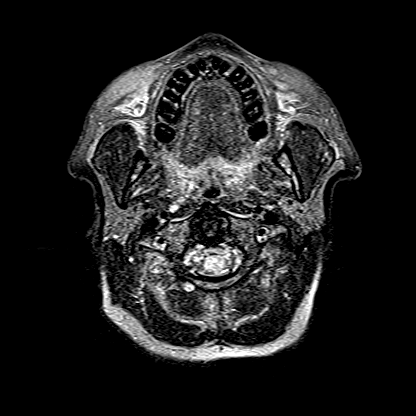
[im 28/55]
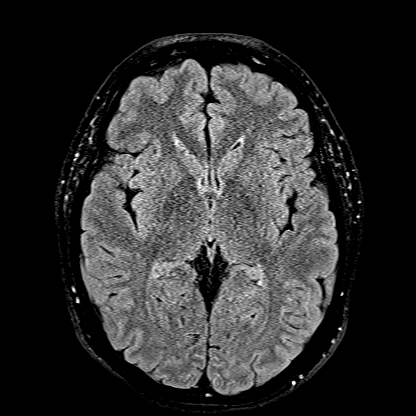
[im 55/55]
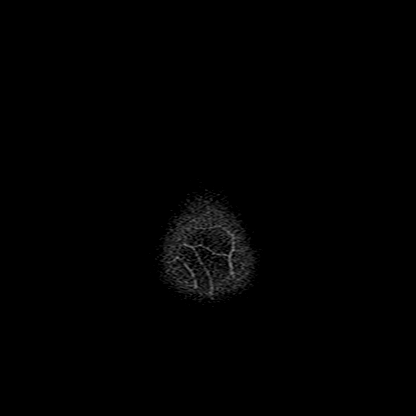

[Series 16: T1 · axial · 1.0mm · 0.98mm/px · z∈[-112,+63]mm · 8 of 176 slices shown (2 of 2)]
[im 1/176]
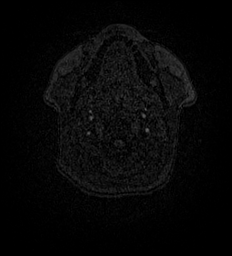
[im 20/176]
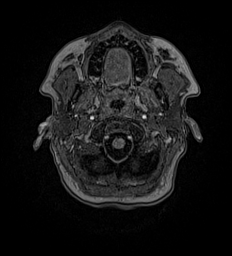
[im 59/176]
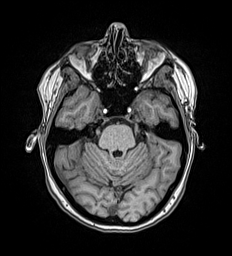
[im 78/176]
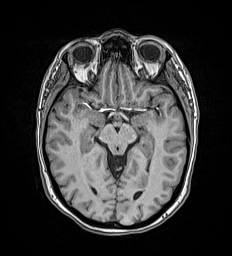
[im 98/176]
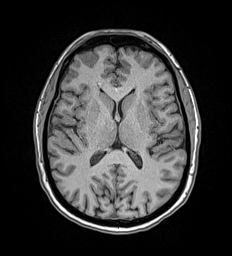
[im 117/176]
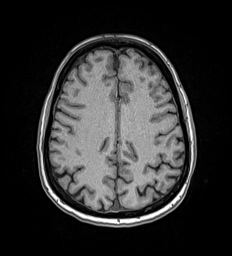
[im 156/176]
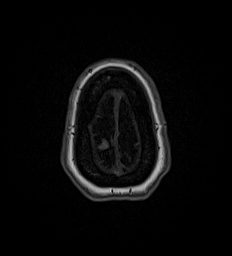
[im 176/176]
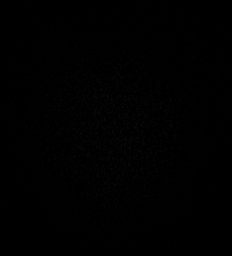

[Series 17: T2 · coronal · 5.0mm · 0.57mm/px · 2 of 29 slices shown (2 of 2)]
[im 1/29]
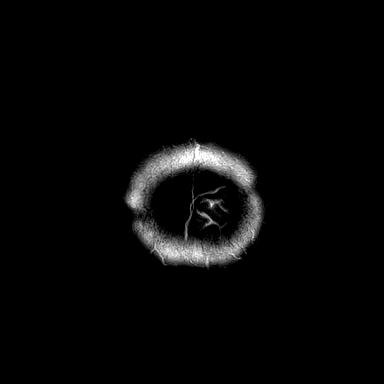
[im 29/29]
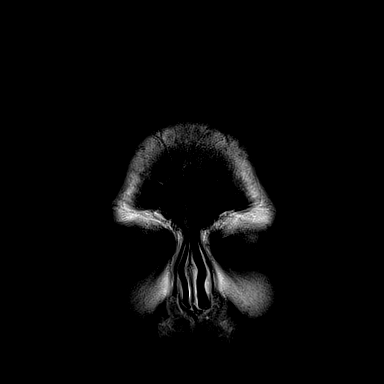

[Series 18: t2_space_tra_(id)_iso · axial · 0.6mm · 0.30mm/px · 1 of 60 slices shown]
[im 1/60]
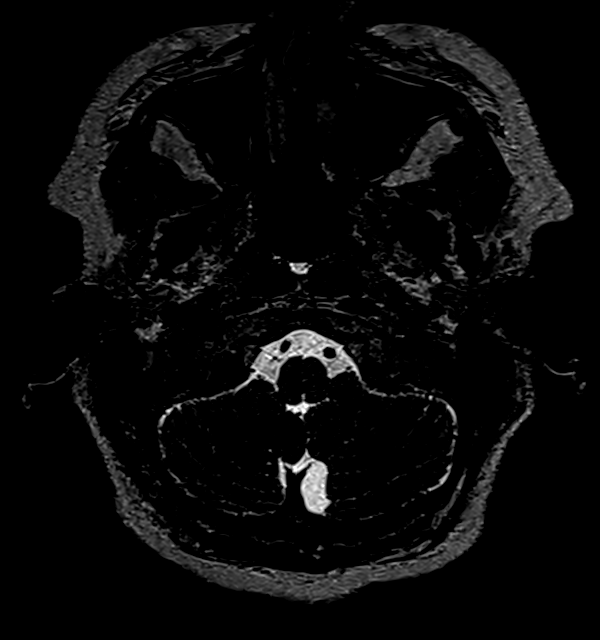

[44 of 48 positions shown; findings below may reference images not displayed]

FINDINGS: Brain: The brain has a normal appearance without evidence of
malformation, atrophy, old or acute small or large vessel
infarction, mass lesion, hemorrhage, hydrocephalus or extra-axial
collection. CP angle regions appear normal.

Vascular: Major vessels at the base of the brain show flow. Venous
sinuses appear patent.

Skull and upper cervical spine: Normal.

Sinuses/Orbits: Clear/normal.

Other: No fluid in the middle ears or mastoids.
IMPRESSION: Normal examination. No abnormality seen to explain the presenting
symptoms.

## 2022-11-28 ENCOUNTER — Other Ambulatory Visit: Payer: Self-pay | Admitting: Nurse Practitioner

## 2022-11-28 DIAGNOSIS — R109 Unspecified abdominal pain: Secondary | ICD-10-CM

## 2022-11-28 NOTE — Telephone Encounter (Signed)
Ok to send please review

## 2022-12-24 ENCOUNTER — Other Ambulatory Visit: Payer: Self-pay | Admitting: Nurse Practitioner

## 2022-12-24 DIAGNOSIS — R109 Unspecified abdominal pain: Secondary | ICD-10-CM

## 2023-01-14 ENCOUNTER — Telehealth: Payer: Self-pay | Admitting: Nurse Practitioner

## 2023-01-14 NOTE — Telephone Encounter (Signed)
Lvm to move 12/27 appointment up to sooner date-Toni

## 2023-01-15 ENCOUNTER — Ambulatory Visit: Payer: BC Managed Care – PPO | Admitting: Nurse Practitioner

## 2023-01-24 DIAGNOSIS — M7061 Trochanteric bursitis, right hip: Secondary | ICD-10-CM | POA: Diagnosis not present

## 2023-01-25 ENCOUNTER — Telehealth: Payer: Self-pay | Admitting: Nurse Practitioner

## 2023-01-25 NOTE — Telephone Encounter (Signed)
Lvm for patient to return call to move 02/08/23 to 01/31/23 per patient request for earlier appt-Toni

## 2023-02-08 ENCOUNTER — Ambulatory Visit: Payer: BC Managed Care – PPO | Admitting: Nurse Practitioner

## 2023-02-12 ENCOUNTER — Encounter: Payer: Self-pay | Admitting: Nurse Practitioner

## 2023-02-12 ENCOUNTER — Ambulatory Visit: Payer: BC Managed Care – PPO | Admitting: Nurse Practitioner

## 2023-02-12 VITALS — BP 108/76 | HR 87 | Temp 98.4°F | Resp 16 | Ht 71.0 in | Wt 186.6 lb

## 2023-02-12 DIAGNOSIS — N951 Menopausal and female climacteric states: Secondary | ICD-10-CM

## 2023-02-12 DIAGNOSIS — F321 Major depressive disorder, single episode, moderate: Secondary | ICD-10-CM

## 2023-02-12 DIAGNOSIS — G4709 Other insomnia: Secondary | ICD-10-CM | POA: Diagnosis not present

## 2023-02-12 DIAGNOSIS — R41840 Attention and concentration deficit: Secondary | ICD-10-CM | POA: Diagnosis not present

## 2023-02-12 MED ORDER — AMPHETAMINE-DEXTROAMPHETAMINE 10 MG PO TABS: ORAL_TABLET | ORAL | 0 refills | Status: DC

## 2023-02-12 MED ORDER — ZOLPIDEM TARTRATE 10 MG PO TABS: ORAL_TABLET | ORAL | 2 refills | Status: DC

## 2023-02-12 MED ORDER — SERTRALINE HCL 50 MG PO TABS: 75.0000 mg | ORAL_TABLET | Freq: Every day | ORAL | 3 refills | Status: DC

## 2023-02-12 NOTE — Progress Notes (Signed)
 [facility] [address] [place], [place] [address]  Internal MEDICINE  Office Visit Note  Patient Name: Deanna Sims  [id number]  [id number]  Date of Service: 02/12/2023  Chief Complaint  Patient presents with   Follow-up    HPI Amanie presents for a follow-up visit for ADHD, vasomotor symptoms, anxiety and insomnia.  ADHD -- current dose remains effective. Heart rate and blood pressure are normal. Denies any palpitations or other adverse side effects of the medication.  Perimenopausal symptoms -- mood swings, low libido, exhausted, brain fog Anxiety -- increased, wants to increase sertraline  dose  Insomnia -- taking ambien , eventually wants to come off of ambien .      Current Medication: Outpatient Encounter Medications as of 02/12/2023  Medication Sig   cetirizine  (ZYRTEC ) 10 MG tablet Take 1 tablet (10 mg total) by mouth daily.   fluticasone (FLONASE) 50 MCG/ACT nasal spray Place 1 spray into both nostrils daily as needed for allergies or rhinitis.   ibuprofen  (ADVIL ) 800 MG tablet TAKE 1 TABLET BY MOUTH EVERY 8 HOURS AS NEEDED FOR MILD PAIN, MODERATE PAIN OR CRAMPING.   ondansetron  (ZOFRAN ) 4 MG tablet Take 1 tablet (4 mg total) by mouth every 8 (eight) hours as needed for nausea or vomiting.   SPRINTEC 28 0.25-35 MG-MCG tablet Take 1 tablet by mouth daily.   [DISCONTINUED] amphetamine -dextroamphetamine  (ADDERALL) 10 MG tablet Take 1 tablet by mouth in the morning and take 1 tablet by mouth between noon and 2pm.   [DISCONTINUED] amphetamine -dextroamphetamine  (ADDERALL) 10 MG tablet Take 1 tablet by mouth in the morning and take 1 tablet by mouth between noon and 2pm.   [DISCONTINUED] amphetamine -dextroamphetamine  (ADDERALL) 10 MG tablet Take 1 tablet by mouth in the morning and take 1 tablet by mouth between noon and 2pm.   [DISCONTINUED] sertraline  (ZOLOFT ) 50 MG tablet Take 1 tablet (50 mg total) by mouth daily.   [DISCONTINUED] zolpidem  (AMBIEN ) 10 MG tablet  Take one tab po qhs as needed, may take one extra if needed   [START ON 04/09/2023] amphetamine -dextroamphetamine  (ADDERALL) 10 MG tablet Take 1 tablet by mouth in the morning and take 1 tablet by mouth between noon and 2pm.   [START ON 03/12/2023] amphetamine -dextroamphetamine  (ADDERALL) 10 MG tablet Take 1 tablet by mouth in the morning and take 1 tablet by mouth between noon and 2pm.   amphetamine -dextroamphetamine  (ADDERALL) 10 MG tablet Take 1 tablet by mouth in the morning and take 1 tablet by mouth between noon and 2pm.   sertraline  (ZOLOFT ) 50 MG tablet Take 1.5 tablets (75 mg total) by mouth daily.   zolpidem  (AMBIEN ) 10 MG tablet Take one tab po qhs as needed, may take one extra if needed   No facility-administered encounter medications on file as of 02/12/2023.    Surgical History: Past Surgical History:  Procedure Laterality Date   CESAREAN SECTION N/A 08/11/2015   Procedure: Primary CESAREAN SECTION;  Surgeon: Charlie Flowers, MD;  Location: [facility] BIRTHING SUITES;  Service: Obstetrics;  Laterality: N/A;  EDD: 08/17/15 Allergy: PCN   CESAREAN SECTION N/A 08/14/2017   Procedure: Repeat CESAREAN SECTION;  Surgeon: Flowers Charlie, MD;  Location: [facility] BIRTHING SUITES;  Service: Obstetrics;  Laterality: N/A;  EDD: 08/20/17 Allergy: Penicillin   DILATION AND EVACUATION N/A 03/15/2014   Procedure: DILATATION AND EVACUATION;  Surgeon: Ronal Elvie Pinal, MD;  Location: [facility] ORS;  Service: Gynecology;  Laterality: N/A;   LAPAROSCOPY N/A 03/15/2014   Procedure: LAPAROSCOPY OPERATIVE;  Surgeon: Ronal Elvie Pinal, MD;  Location: [facility]  ORS;  Service: Gynecology;  Laterality: N/A;   WISDOM TOOTH EXTRACTION      Medical History: Past Medical History:  Diagnosis Date   ADHD    Bell palsy    Insomnia    Postpartum care following repeat cesarean delivery (6/29) 08/12/2015   Seasonal allergies    Sinusitis     Family History: Family History  Problem Relation Age of Onset   Uterine cancer Mother 3    Cancer - Ovarian Maternal Grandmother    Cancer - Lung Maternal Grandmother    Cancer - Lung Maternal Grandfather     Social History   Socioeconomic History   Marital status: Married    Spouse name: Not on file   Number of children: Not on file   Years of education: Not on file   Highest education level: Not on file  Occupational History   Not on file  Tobacco Use   Smoking status: Former    Current packs/day: 0.00    Average packs/day: 1 pack/day for 13.0 years (13.0 ttl pk-yrs)    Types: Cigarettes    Start date: 08/12/2000    Quit date: 08/12/2013    Years since quitting: 9.5   Smokeless tobacco: Never  Vaping Use   Vaping status: Never Used  Substance and Sexual Activity   Alcohol use: Yes    Alcohol/week: 5.0 standard drinks of alcohol    Types: 5 Glasses of wine per week    Comment: ocassionally   Drug use: No   Sexual activity: Yes    Partners: Male    Birth control/protection: None    Comment: approx 7-[redacted] wks gestation?? per patient  Other Topics Concern   Not on file  Social History Narrative   Not on file   Social Drivers of Health   Financial Resource Strain: Not on file  Food Insecurity: Not on file  Transportation Needs: Not on file  Physical Activity: Not on file  Stress: Not on file  Social Connections: Not on file  Intimate Partner Violence: Not on file      Review of Systems  Constitutional:  Positive for fatigue. Negative for chills and unexpected weight change.  HENT:  Negative for congestion, postnasal drip, rhinorrhea, sneezing and sore throat.   Eyes:  Negative for redness.  Respiratory: Negative.  Negative for cough, chest tightness, shortness of breath and wheezing.   Cardiovascular: Negative.  Negative for chest pain and palpitations.  Gastrointestinal:  Negative for abdominal pain, constipation, diarrhea, nausea and vomiting.  Genitourinary:  Negative for dysuria and frequency.  Musculoskeletal:  Negative for arthralgias, back pain,  joint swelling and neck pain.  Skin:  Negative for rash.  Neurological: Negative.  Negative for tremors and numbness.  Hematological:  Negative for adenopathy. Does not bruise/bleed easily.  Psychiatric/Behavioral:  Positive for behavioral problems (Depression), decreased concentration and sleep disturbance. Negative for self-injury and suicidal ideas. The patient is not nervous/anxious.     Vital Signs: BP 108/76   Pulse 87   Temp 98.4 F (36.9 C)   Resp 16   Ht 5' 11 (1.803 m)   Wt 186 lb 9.6 oz (84.6 kg)   SpO2 97%   BMI 26.03 kg/m    Physical Exam Vitals reviewed.  Constitutional:      Appearance: Normal appearance.  HENT:     Head: Normocephalic and atraumatic.     Mouth/Throat:     Pharynx: No posterior oropharyngeal erythema.  Eyes:     Extraocular Movements: Extraocular  movements intact.     Pupils: Pupils are equal, round, and reactive to light.  Cardiovascular:     Rate and Rhythm: Normal rate and regular rhythm.     Pulses: Normal pulses.  Pulmonary:     Effort: Pulmonary effort is normal.  Neurological:     Mental Status: She is alert and oriented to person, place, and time.  Psychiatric:        Mood and Affect: Mood normal.        Behavior: Behavior normal.     Comments:          Assessment/Plan: 1. Vasomotor symptoms due to menopause (Primary) Increased sertraline  to help with mood swings.   2. Other insomnia Continue ambien  as prescribed. Follow up in 3 months for additional refills.  - zolpidem  (AMBIEN ) 10 MG tablet; Take one tab po qhs as needed, may take one extra if needed  Dispense: 30 tablet; Refill: 2  3. Attention and concentration deficit Continue adderall as prescribed. Follow up in 3 months for additional refills. UDS due at next office visit.  - amphetamine -dextroamphetamine  (ADDERALL) 10 MG tablet; Take 1 tablet by mouth in the morning and take 1 tablet by mouth between noon and 2pm.  Dispense: 60 tablet; Refill: 0 -  amphetamine -dextroamphetamine  (ADDERALL) 10 MG tablet; Take 1 tablet by mouth in the morning and take 1 tablet by mouth between noon and 2pm.  Dispense: 60 tablet; Refill: 0 - amphetamine -dextroamphetamine  (ADDERALL) 10 MG tablet; Take 1 tablet by mouth in the morning and take 1 tablet by mouth between noon and 2pm.  Dispense: 60 tablet; Refill: 0  4. Current moderate episode of major depressive disorder without prior episode (HCC) Sertraline  dose increased. Take as prescribed.  - sertraline  (ZOLOFT ) 50 MG tablet; Take 1.5 tablets (75 mg total) by mouth daily.  Dispense: 135 tablet; Refill: 3   General Counseling: Destin verbalizes understanding of the findings of todays visit and agrees with plan of treatment. I have discussed any further diagnostic evaluation that may be needed or ordered today. We also reviewed her medications today. she has been encouraged to call the office with any questions or concerns that should arise related to todays visit.    No orders of the defined types were placed in this encounter.   Meds ordered this encounter  Medications   amphetamine -dextroamphetamine  (ADDERALL) 10 MG tablet    Sig: Take 1 tablet by mouth in the morning and take 1 tablet by mouth between noon and 2pm.    Dispense:  60 tablet    Refill:  0    Fill for february   amphetamine -dextroamphetamine  (ADDERALL) 10 MG tablet    Sig: Take 1 tablet by mouth in the morning and take 1 tablet by mouth between noon and 2pm.    Dispense:  60 tablet    Refill:  0    Fill for january   amphetamine -dextroamphetamine  (ADDERALL) 10 MG tablet    Sig: Take 1 tablet by mouth in the morning and take 1 tablet by mouth between noon and 2pm.    Dispense:  60 tablet    Refill:  0    Fill for december   zolpidem  (AMBIEN ) 10 MG tablet    Sig: Take one tab po qhs as needed, may take one extra if needed    Dispense:  30 tablet    Refill:  2   sertraline  (ZOLOFT ) 50 MG tablet    Sig: Take 1.5 tablets (75 mg  total) by mouth  daily.    Dispense:  135 tablet    Refill:  3    Note increased dose, and increased number of tablets, please fill today.    Return in about 3 months (around 05/08/2023) for F/U, ADHD med check, Jerard Bays PCP and ambien  refills. need UDS at next visit. .   Total time spent:30 Minutes Time spent includes review of chart, medications, test results, and follow up plan with the patient.   [place] Controlled Substance Database was reviewed by me.  This patient was seen by Mardy Maxin, FNP-C in collaboration with Dr. Sigrid Bathe as a part of collaborative care agreement.   Odies Desa R. Maxin, MSN, FNP-C Internal medicine

## 2023-02-13 ENCOUNTER — Encounter: Payer: Self-pay | Admitting: Nurse Practitioner

## 2023-03-15 ENCOUNTER — Telehealth: Payer: Self-pay | Admitting: Nurse Practitioner

## 2023-03-15 NOTE — Telephone Encounter (Signed)
Left vm and sent mychart message to confirm 03/22/23 appointment-Toni

## 2023-03-22 ENCOUNTER — Encounter: Payer: BC Managed Care – PPO | Admitting: Nurse Practitioner

## 2023-04-16 ENCOUNTER — Other Ambulatory Visit: Payer: Self-pay | Admitting: Nurse Practitioner

## 2023-05-08 ENCOUNTER — Encounter: Payer: Self-pay | Admitting: Nurse Practitioner

## 2023-05-08 ENCOUNTER — Ambulatory Visit (INDEPENDENT_AMBULATORY_CARE_PROVIDER_SITE_OTHER): Payer: BC Managed Care – PPO | Admitting: Nurse Practitioner

## 2023-05-08 VITALS — BP 110/80 | HR 82 | Temp 98.3°F | Resp 16 | Ht 71.0 in | Wt 180.2 lb

## 2023-05-08 DIAGNOSIS — Z79899 Other long term (current) drug therapy: Secondary | ICD-10-CM

## 2023-05-08 DIAGNOSIS — G4709 Other insomnia: Secondary | ICD-10-CM | POA: Diagnosis not present

## 2023-05-08 DIAGNOSIS — R41840 Attention and concentration deficit: Secondary | ICD-10-CM | POA: Diagnosis not present

## 2023-05-08 DIAGNOSIS — N951 Menopausal and female climacteric states: Secondary | ICD-10-CM | POA: Diagnosis not present

## 2023-05-08 LAB — POCT URINE DRUG SCREEN
Methylenedioxyamphetamine: NOT DETECTED
POC Amphetamine UR: POSITIVE — AB
POC BENZODIAZEPINES UR: NOT DETECTED
POC Barbiturate UR: NOT DETECTED
POC Cocaine UR: NOT DETECTED
POC Ecstasy UR: NOT DETECTED
POC Marijuana UR: NOT DETECTED
POC Methadone UR: NOT DETECTED
POC Methamphetamine UR: NOT DETECTED
POC Opiate Ur: NOT DETECTED
POC Oxycodone UR: NOT DETECTED
POC PHENCYCLIDINE UR: NOT DETECTED
POC TRICYCLICS UR: NOT DETECTED

## 2023-05-08 MED ORDER — AMPHETAMINE-DEXTROAMPHETAMINE 10 MG PO TABS: ORAL_TABLET | ORAL | 0 refills | Status: DC

## 2023-05-08 NOTE — Progress Notes (Signed)
 [facility] [address] [place], [place] [address]  Internal MEDICINE  Office Visit Note  Patient Name: Deanna Sims  [id number]  [id number]  Date of Service: 05/08/2023  Chief Complaint  Patient presents with   Follow-up    HPI [name] presents for a follow-up visit for ADHD, insomnia, vasomotor symptoms and anxiety, and depression.  ADHD -- current dose is effective. Blood pressure and heart rate are normal. Denies any palpitations or other adverse side effects of the medication. Due for UDS today and due for refills.  Insomnia -- weaning off ambien. Declined refill, states she has enough to finish weaning off the medication has noticed an improvement in her memory since starting to wean off the medication.  Anxiety and depression -- taking sertraline  Vasomotor symptoms -- stable, sertraline helps with some of the symptoms     Current Medication: Outpatient Encounter Medications as of 05/08/2023  Medication Sig   cetirizine (ZYRTEC) 10 MG tablet Take 1 tablet (10 mg total) by mouth daily.   fluticasone (FLONASE) 50 MCG/ACT nasal spray Place 1 spray into both nostrils daily as needed for allergies or rhinitis.   ibuprofen (ADVIL) 800 MG tablet TAKE 1 TABLET BY MOUTH EVERY 8 HOURS AS NEEDED FOR MILD PAIN, MODERATE PAIN OR CRAMPING.   ondansetron (ZOFRAN) 4 MG tablet Take 1 tablet (4 mg total) by mouth every 8 (eight) hours as needed for nausea or vomiting.   sertraline (ZOLOFT) 50 MG tablet Take 1.5 tablets (75 mg total) by mouth daily.   SPRINTEC 28 0.25-35 MG-MCG tablet Take 1 tablet by mouth daily.   zolpidem (AMBIEN) 10 MG tablet Take one tab po qhs as needed, may take one extra if needed   [DISCONTINUED] amphetamine-dextroamphetamine (ADDERALL) 10 MG tablet Take 1 tablet by mouth in the morning and take 1 tablet by mouth between noon and 2pm.   [DISCONTINUED] amphetamine-dextroamphetamine (ADDERALL) 10 MG tablet Take 1 tablet by mouth in the morning and take 1  tablet by mouth between noon and 2pm.   [DISCONTINUED] amphetamine-dextroamphetamine (ADDERALL) 10 MG tablet Take 1 tablet by mouth in the morning and take 1 tablet by mouth between noon and 2pm.   [START ON 07/03/2023] amphetamine-dextroamphetamine (ADDERALL) 10 MG tablet Take 1 tablet by mouth in the morning and take 1 tablet by mouth between noon and 2pm.   [START ON 06/05/2023] amphetamine-dextroamphetamine (ADDERALL) 10 MG tablet Take 1 tablet by mouth in the morning and take 1 tablet by mouth between noon and 2pm.   amphetamine-dextroamphetamine (ADDERALL) 10 MG tablet Take 1 tablet by mouth in the morning and take 1 tablet by mouth between noon and 2pm.   No facility-administered encounter medications on file as of 05/08/2023.    Surgical History: Past Surgical History:  Procedure Laterality Date   CESAREAN SECTION N/A 08/11/2015   Procedure: Primary CESAREAN SECTION;  Surgeon: Olivia Mackie, MD;  Location: [facility] BIRTHING SUITES;  Service: Obstetrics;  Laterality: N/A;  EDD: 08/17/15 Allergy: PCN   CESAREAN SECTION N/A 08/14/2017   Procedure: Repeat CESAREAN SECTION;  Surgeon: Olivia Mackie, MD;  Location: [facility] BIRTHING SUITES;  Service: Obstetrics;  Laterality: N/A;  EDD: 08/20/17 Allergy: Penicillin   DILATION AND EVACUATION N/A 03/15/2014   Procedure: DILATATION AND EVACUATION;  Surgeon: Annamaria Boots, MD;  Location: [facility];  Service: Gynecology;  Laterality: N/A;   LAPAROSCOPY N/A 03/15/2014   Procedure: LAPAROSCOPY OPERATIVE;  Surgeon: Annamaria Boots, MD;  Location: [facility];  Service: Gynecology;  Laterality: N/A;  WISDOM TOOTH EXTRACTION      Medical History: Past Medical History:  Diagnosis Date   ADHD    Bell palsy    Insomnia    Postpartum care following repeat cesarean delivery (6/29) 08/12/2015   Seasonal allergies    Sinusitis     Family History: Family History  Problem Relation Age of Onset   Uterine cancer Mother 25   Cancer - Ovarian Maternal Grandmother     Cancer - Lung Maternal Grandmother    Cancer - Lung Maternal Grandfather     Social History   Socioeconomic History   Marital status: Married    Spouse name: Not on file   Number of children: Not on file   Years of education: Not on file   Highest education level: Not on file  Occupational History   Not on file  Tobacco Use   Smoking status: Former    Current packs/day: 0.00    Average packs/day: 1 pack/day for 13.0 years (13.0 ttl pk-yrs)    Types: Cigarettes    Start date: 08/12/2000    Quit date: 08/12/2013    Years since quitting: 9.7   Smokeless tobacco: Never  Vaping Use   Vaping status: Never Used  Substance and Sexual Activity   Alcohol use: Yes    Alcohol/week: 5.0 standard drinks of alcohol    Types: 5 Glasses of wine per week    Comment: ocassionally   Drug use: No   Sexual activity: Yes    Partners: Male    Birth control/protection: None    Comment: approx 7-[redacted] wks gestation?? per patient  Other Topics Concern   Not on file  Social History Narrative   Not on file   Social Drivers of Health   Financial Resource Strain: Not on file  Food Insecurity: Not on file  Transportation Needs: Not on file  Physical Activity: Not on file  Stress: Not on file  Social Connections: Not on file  Intimate Partner Violence: Not on file      Review of Systems  Constitutional:  Positive for fatigue. Negative for chills and unexpected weight change.  HENT:  Negative for congestion, postnasal drip, rhinorrhea, sneezing and sore throat.   Eyes:  Negative for redness.  Respiratory: Negative.  Negative for cough, chest tightness, shortness of breath and wheezing.   Cardiovascular: Negative.  Negative for chest pain and palpitations.  Gastrointestinal:  Negative for abdominal pain, constipation, diarrhea, nausea and vomiting.  Genitourinary:  Negative for dysuria and frequency.  Musculoskeletal:  Negative for arthralgias, back pain, joint swelling and neck pain.  Skin:   Negative for rash.  Neurological: Negative.  Negative for tremors and numbness.  Hematological:  Negative for adenopathy. Does not bruise/bleed easily.  Psychiatric/Behavioral:  Positive for behavioral problems (Depression), decreased concentration and sleep disturbance. Negative for self-injury and suicidal ideas. The patient is not nervous/anxious.     Vital Signs: BP 110/80   Pulse 82   Temp 98.3 F (36.8 C)   Resp 16   Ht 5\' 11"  (1.803 m)   Wt 180 lb 3.2 oz (81.7 kg)   SpO2 97%   BMI 25.13 kg/m    Physical Exam Vitals reviewed.  Constitutional:      General: She is not in acute distress.    Appearance: Normal appearance. She is not ill-appearing.  HENT:     Head: Normocephalic and atraumatic.  Eyes:     Pupils: Pupils are equal, round, and reactive to light.  Cardiovascular:  Rate and Rhythm: Normal rate and regular rhythm.  Pulmonary:     Effort: Pulmonary effort is normal. No respiratory distress.  Neurological:     Mental Status: She is alert and oriented to person, place, and time.  Psychiatric:        Mood and Affect: Mood normal.        Behavior: Behavior normal.        Assessment/Plan: 1. Vasomotor symptoms due to menopause (Primary) Continue sertraline as prescribed.   2. Attention and concentration deficit Continue adderall as prescribed, follow up in 3 months for additional refills  - amphetamine-dextroamphetamine (ADDERALL) 10 MG tablet; Take 1 tablet by mouth in the morning and take 1 tablet by mouth between noon and 2pm.  Dispense: 60 tablet; Refill: 0 - amphetamine-dextroamphetamine (ADDERALL) 10 MG tablet; Take 1 tablet by mouth in the morning and take 1 tablet by mouth between noon and 2pm.  Dispense: 60 tablet; Refill: 0 - amphetamine-dextroamphetamine (ADDERALL) 10 MG tablet; Take 1 tablet by mouth in the morning and take 1 tablet by mouth between noon and 2pm.  Dispense: 60 tablet; Refill: 0  3. Other insomnia Continue to wean off ambien  as discussed   4. Encounter for long-term (current) use of medications UDS done today, positive for amphetamine which is consistent with current medication prescription - POCT Urine Drug Screen   General Counseling: Myliah verbalizes understanding of the findings of todays visit and agrees with plan of treatment. I have discussed any further diagnostic evaluation that may be needed or ordered today. We also reviewed her medications today. she has been encouraged to call the office with any questions or concerns that should arise related to todays visit.    Orders Placed This Encounter  Procedures   POCT Urine Drug Screen    Meds ordered this encounter  Medications   amphetamine-dextroamphetamine (ADDERALL) 10 MG tablet    Sig: Take 1 tablet by mouth in the morning and take 1 tablet by mouth between noon and 2pm.    Dispense:  60 tablet    Refill:  0    Fill for may   amphetamine-dextroamphetamine (ADDERALL) 10 MG tablet    Sig: Take 1 tablet by mouth in the morning and take 1 tablet by mouth between noon and 2pm.    Dispense:  60 tablet    Refill:  0    Fill for april   amphetamine-dextroamphetamine (ADDERALL) 10 MG tablet    Sig: Take 1 tablet by mouth in the morning and take 1 tablet by mouth between noon and 2pm.    Dispense:  60 tablet    Refill:  0    Fill for march    Return in about 3 months (around 08/08/2023) for CPE, Yee Joss PCP and , ADHD med check.   Total time spent:30 Minutes Time spent includes review of chart, medications, test results, and follow up plan with the patient.   [place] Controlled Substance Database was reviewed by me.  This patient was seen by Sallyanne Kuster, FNP-C in collaboration with Dr. Beverely Risen as a part of collaborative care agreement.   Jerine Surles R. Tedd Sias, MSN, FNP-C Internal medicine

## 2023-05-30 ENCOUNTER — Telehealth: Payer: Self-pay

## 2023-05-30 ENCOUNTER — Other Ambulatory Visit: Payer: Self-pay

## 2023-05-30 MED ORDER — AZITHROMYCIN 250 MG PO TABS: ORAL_TABLET | ORAL | 0 refills | Status: DC

## 2023-05-30 NOTE — Telephone Encounter (Signed)
 Zpak if not allergic, 5 days

## 2023-05-30 NOTE — Telephone Encounter (Signed)
 Patient notified

## 2023-06-15 ENCOUNTER — Other Ambulatory Visit: Payer: Self-pay | Admitting: Nurse Practitioner

## 2023-06-15 DIAGNOSIS — F321 Major depressive disorder, single episode, moderate: Secondary | ICD-10-CM

## 2023-07-22 ENCOUNTER — Other Ambulatory Visit: Payer: Self-pay | Admitting: Nurse Practitioner

## 2023-07-30 ENCOUNTER — Telehealth: Payer: Self-pay | Admitting: Nurse Practitioner

## 2023-07-30 NOTE — Telephone Encounter (Signed)
 Left vm and sent mychart message to confirm 08/06/23 appointment-Toni

## 2023-08-06 ENCOUNTER — Encounter: Payer: Self-pay | Admitting: Nurse Practitioner

## 2023-08-06 ENCOUNTER — Ambulatory Visit (INDEPENDENT_AMBULATORY_CARE_PROVIDER_SITE_OTHER): Admitting: Nurse Practitioner

## 2023-08-06 VITALS — BP 120/78 | HR 78 | Temp 98.3°F | Resp 16 | Ht 71.0 in | Wt 180.6 lb

## 2023-08-06 DIAGNOSIS — E559 Vitamin D deficiency, unspecified: Secondary | ICD-10-CM

## 2023-08-06 DIAGNOSIS — Z1212 Encounter for screening for malignant neoplasm of rectum: Secondary | ICD-10-CM

## 2023-08-06 DIAGNOSIS — Z0001 Encounter for general adult medical examination with abnormal findings: Secondary | ICD-10-CM

## 2023-08-06 DIAGNOSIS — R41840 Attention and concentration deficit: Secondary | ICD-10-CM

## 2023-08-06 DIAGNOSIS — N951 Menopausal and female climacteric states: Secondary | ICD-10-CM | POA: Diagnosis not present

## 2023-08-06 DIAGNOSIS — E782 Mixed hyperlipidemia: Secondary | ICD-10-CM | POA: Diagnosis not present

## 2023-08-06 DIAGNOSIS — E538 Deficiency of other specified B group vitamins: Secondary | ICD-10-CM | POA: Diagnosis not present

## 2023-08-06 DIAGNOSIS — Z1211 Encounter for screening for malignant neoplasm of colon: Secondary | ICD-10-CM

## 2023-08-06 MED ORDER — AMPHETAMINE-DEXTROAMPHETAMINE 10 MG PO TABS: ORAL_TABLET | ORAL | 0 refills | Status: DC

## 2023-08-06 NOTE — Progress Notes (Signed)
 [facility] [address] [place], [place] [address]  Internal MEDICINE  Office Visit Note  Patient Name: Deanna Sims  [id number]  [id number]  Date of Service: 08/06/2023  Chief Complaint  Patient presents with   Annual Exam    HPI Trinidee presents for an annual well visit and physical exam.  Well-appearing 48 y.o. female with ADHD, insomnia, depression, allergic rhinitis, and anxiety. Routine CRC screening: overdue, opt for cologuard.  Routine mammogram: scheduled this summer  Pap smear: sees OBGYN Labs: due for routine labs  New or worsening pain: none  Other concerns: weight gain and fatigue    Current Medication: Outpatient Encounter Medications as of 08/06/2023  Medication Sig   amphetamine -dextroamphetamine  (ADDERALL) 10 MG tablet Take 1 tablet by mouth in the morning and take 1 tablet by mouth between noon and 2pm.   [START ON 09/03/2023] amphetamine -dextroamphetamine  (ADDERALL) 10 MG tablet Take 1 tablet by mouth in the morning and take 1 tablet by mouth between noon and 2pm.   [START ON 10/01/2023] amphetamine -dextroamphetamine  (ADDERALL) 10 MG tablet Take 1 tablet by mouth in the morning and take 1 tablet by mouth between noon and 2pm.   cetirizine  (ZYRTEC ) 10 MG tablet Take 1 tablet (10 mg total) by mouth daily.   fluticasone (FLONASE) 50 MCG/ACT nasal spray Place 1 spray into both nostrils daily as needed for allergies or rhinitis.   ibuprofen  (ADVIL ) 800 MG tablet TAKE 1 TABLET BY MOUTH EVERY 8 HOURS AS NEEDED FOR MILD PAIN, MODERATE PAIN OR CRAMPING.   ondansetron  (ZOFRAN ) 4 MG tablet Take 1 tablet (4 mg total) by mouth every 8 (eight) hours as needed for nausea or vomiting.   sertraline  (ZOLOFT ) 50 MG tablet TAKE 1 TABLET BY MOUTH EVERY DAY   SPRINTEC 28 0.25-35 MG-MCG tablet Take 1 tablet by mouth daily.   zolpidem  (AMBIEN ) 10 MG tablet Take one tab po qhs as needed, may take one extra if needed   [DISCONTINUED] amphetamine -dextroamphetamine  (ADDERALL)  10 MG tablet Take 1 tablet by mouth in the morning and take 1 tablet by mouth between noon and 2pm.   [DISCONTINUED] amphetamine -dextroamphetamine  (ADDERALL) 10 MG tablet Take 1 tablet by mouth in the morning and take 1 tablet by mouth between noon and 2pm.   [DISCONTINUED] amphetamine -dextroamphetamine  (ADDERALL) 10 MG tablet Take 1 tablet by mouth in the morning and take 1 tablet by mouth between noon and 2pm.   [DISCONTINUED] azithromycin  (ZITHROMAX ) 250 MG tablet Use as directed for 5 days.   No facility-administered encounter medications on file as of 08/06/2023.    Surgical History: Past Surgical History:  Procedure Laterality Date   CESAREAN SECTION N/A 08/11/2015   Procedure: Primary CESAREAN SECTION;  Surgeon: Charlie Flowers, MD;  Location: [facility] BIRTHING SUITES;  Service: Obstetrics;  Laterality: N/A;  EDD: 08/17/15 Allergy: PCN   CESAREAN SECTION N/A 08/14/2017   Procedure: Repeat CESAREAN SECTION;  Surgeon: Flowers Charlie, MD;  Location: [facility] BIRTHING SUITES;  Service: Obstetrics;  Laterality: N/A;  EDD: 08/20/17 Allergy: Penicillin   DILATION AND EVACUATION N/A 03/15/2014   Procedure: DILATATION AND EVACUATION;  Surgeon: Ronal Elvie Pinal, MD;  Location: [facility];  Service: Gynecology;  Laterality: N/A;   LAPAROSCOPY N/A 03/15/2014   Procedure: LAPAROSCOPY OPERATIVE;  Surgeon: Ronal Elvie Pinal, MD;  Location: [facility];  Service: Gynecology;  Laterality: N/A;   WISDOM TOOTH EXTRACTION      Medical History: Past Medical History:  Diagnosis Date   ADHD    Bell palsy    Insomnia  Postpartum care following repeat cesarean delivery (6/29) 08/12/2015   Seasonal allergies    Sinusitis     Family History: Family History  Problem Relation Age of Onset   Uterine cancer Mother 42   Cancer - Ovarian Maternal Grandmother    Cancer - Lung Maternal Grandmother    Cancer - Lung Maternal Grandfather     Social History   Socioeconomic History   Marital status: Married    Spouse name: Not  on file   Number of children: Not on file   Years of education: Not on file   Highest education level: Not on file  Occupational History   Not on file  Tobacco Use   Smoking status: Former    Current packs/day: 0.00    Average packs/day: 1 pack/day for 13.0 years (13.0 ttl pk-yrs)    Types: Cigarettes    Start date: 08/12/2000    Quit date: 08/12/2013    Years since quitting: 9.9   Smokeless tobacco: Never  Vaping Use   Vaping status: Never Used  Substance and Sexual Activity   Alcohol use: Yes    Alcohol/week: 5.0 standard drinks of alcohol    Types: 5 Glasses of wine per week    Comment: ocassionally   Drug use: No   Sexual activity: Yes    Partners: Male    Birth control/protection: None    Comment: approx 7-[redacted] wks gestation?? per patient  Other Topics Concern   Not on file  Social History Narrative   Not on file   Social Drivers of Health   Financial Resource Strain: Not on file  Food Insecurity: Not on file  Transportation Needs: Not on file  Physical Activity: Not on file  Stress: Not on file  Social Connections: Not on file  Intimate Partner Violence: Not on file      Review of Systems  Constitutional:  Negative for activity change, appetite change, chills, fatigue, fever and unexpected weight change.  HENT: Negative.  Negative for congestion, ear pain, rhinorrhea, sore throat and trouble swallowing.   Eyes: Negative.   Respiratory: Negative.  Negative for cough, chest tightness, shortness of breath and wheezing.   Cardiovascular: Negative.  Negative for chest pain and palpitations.  Gastrointestinal: Negative.  Negative for abdominal pain, blood in stool, constipation, diarrhea, nausea and vomiting.  Endocrine: Negative.   Genitourinary: Negative.  Negative for difficulty urinating, dysuria, frequency, hematuria and urgency.  Musculoskeletal: Negative.  Negative for arthralgias, back pain, joint swelling, myalgias and neck pain.  Skin: Negative.  Negative for  rash and wound.  Allergic/Immunologic: Negative.  Negative for immunocompromised state.  Neurological: Negative.  Negative for dizziness, seizures, numbness and headaches.  Hematological: Negative.   Psychiatric/Behavioral: Negative.  Negative for behavioral problems, self-injury and suicidal ideas. The patient is not nervous/anxious.     Vital Signs: BP 120/78   Pulse 78   Temp 98.3 F (36.8 C)   Resp 16   Ht 5' 11 (1.803 m)   Wt 180 lb 9.6 oz (81.9 kg)   SpO2 97%   BMI 25.19 kg/m    Physical Exam Vitals reviewed.  Constitutional:      General: She is not in acute distress.    Appearance: Normal appearance. She is well-developed and well-groomed. She is obese. She is not ill-appearing or diaphoretic.  HENT:     Head: Normocephalic and atraumatic.     Right Ear: Tympanic membrane, ear canal and external ear normal.     Left Ear: Tympanic  membrane, ear canal and external ear normal.     Nose: Nose normal. No congestion or rhinorrhea.     Mouth/Throat:     Lips: Pink.     Mouth: Mucous membranes are moist.     Pharynx: Oropharynx is clear. Uvula midline. No oropharyngeal exudate or posterior oropharyngeal erythema.   Eyes:     General: Lids are normal. Vision grossly intact. Gaze aligned appropriately. No scleral icterus.       Right eye: No discharge.        Left eye: No discharge.     Extraocular Movements: Extraocular movements intact.     Conjunctiva/sclera: Conjunctivae normal.     Pupils: Pupils are equal, round, and reactive to light.     Funduscopic exam:    Right eye: Red reflex present.        Left eye: Red reflex present.  Neck:     Thyroid : No thyromegaly.     Vascular: No JVD.     Trachea: Trachea and phonation normal. No tracheal deviation.   Cardiovascular:     Rate and Rhythm: Normal rate and regular rhythm.     Pulses: Normal pulses.     Heart sounds: Normal heart sounds, S1 normal and S2 normal. No murmur heard.    No friction rub. No gallop.   Pulmonary:     Effort: Pulmonary effort is normal. No accessory muscle usage or respiratory distress.     Breath sounds: Normal breath sounds and air entry. No stridor. No wheezing or rales.  Chest:     Chest wall: No tenderness.     Comments: Declined breast exam Abdominal:     General: Bowel sounds are normal. There is no distension.     Palpations: Abdomen is soft. There is no shifting dullness, fluid wave, mass or pulsatile mass.     Tenderness: There is no abdominal tenderness. There is no guarding or rebound.   Musculoskeletal:        General: No tenderness or deformity. Normal range of motion.     Cervical back: Normal range of motion and neck supple.     Right lower leg: No edema.     Left lower leg: No edema.  Lymphadenopathy:     Cervical: No cervical adenopathy.   Skin:    General: Skin is warm and dry.     Capillary Refill: Capillary refill takes less than 2 seconds.     Coloration: Skin is not pale.     Findings: No erythema or rash.   Neurological:     Mental Status: She is alert and oriented to person, place, and time.     Cranial Nerves: No cranial nerve deficit.     Motor: No abnormal muscle tone.     Coordination: Coordination normal.     Gait: Gait normal.     Deep Tendon Reflexes: Reflexes are normal and symmetric.   Psychiatric:        Mood and Affect: Mood normal.        Behavior: Behavior normal. Behavior is cooperative.        Thought Content: Thought content normal.        Judgment: Judgment normal.        Assessment/Plan: 1. Encounter for routine adult health examination with abnormal findings (Primary) Age-appropriate preventive screenings and vaccinations discussed, annual physical exam completed. Routine labs for health maintenance ordered, see below. PHM updated.   - CBC with Differential/Platelet - CMP14+EGFR - Lipid Profile - Vitamin D  (  25 hydroxy) - B12 and Folate Panel  2. Vasomotor symptoms due to menopause Manageable for now    3. Mixed hyperlipidemia Routine labs ordered  - CBC with Differential/Platelet - CMP14+EGFR - Lipid Profile  4. B12 deficiency Routine labs ordered  - B12 and Folate Panel  5. Vitamin D  deficiency Routine lab ordered  - Vitamin D  (25 hydroxy)  6. Attention and concentration deficit Continue adderall as prescribed, follow up in 3 months for additional refills.  - amphetamine -dextroamphetamine  (ADDERALL) 10 MG tablet; Take 1 tablet by mouth in the morning and take 1 tablet by mouth between noon and 2pm.  Dispense: 60 tablet; Refill: 0 - amphetamine -dextroamphetamine  (ADDERALL) 10 MG tablet; Take 1 tablet by mouth in the morning and take 1 tablet by mouth between noon and 2pm.  Dispense: 60 tablet; Refill: 0 - amphetamine -dextroamphetamine  (ADDERALL) 10 MG tablet; Take 1 tablet by mouth in the morning and take 1 tablet by mouth between noon and 2pm.  Dispense: 60 tablet; Refill: 0  7. Screening for colorectal cancer Cologuard ordered.  - Cologuard      General Counseling: Lakeisa verbalizes understanding of the findings of todays visit and agrees with plan of treatment. I have discussed any further diagnostic evaluation that may be needed or ordered today. We also reviewed her medications today. she has been encouraged to call the office with any questions or concerns that should arise related to todays visit.    Orders Placed This Encounter  Procedures   Cologuard   CBC with Differential/Platelet   CMP14+EGFR   Lipid Profile   Vitamin D  (25 hydroxy)   B12 and Folate Panel    Meds ordered this encounter  Medications   amphetamine -dextroamphetamine  (ADDERALL) 10 MG tablet    Sig: Take 1 tablet by mouth in the morning and take 1 tablet by mouth between noon and 2pm.    Dispense:  60 tablet    Refill:  0    Fill for june   amphetamine -dextroamphetamine  (ADDERALL) 10 MG tablet    Sig: Take 1 tablet by mouth in the morning and take 1 tablet by mouth between noon and 2pm.     Dispense:  60 tablet    Refill:  0    Fill for july   amphetamine -dextroamphetamine  (ADDERALL) 10 MG tablet    Sig: Take 1 tablet by mouth in the morning and take 1 tablet by mouth between noon and 2pm.    Dispense:  60 tablet    Refill:  0    Fill for August.    Return in about 5 weeks (around 09/10/2023) for F/U, Labs, Christorpher Hisaw PCP.   Total time spent:30 Minutes Time spent includes review of chart, medications, test results, and follow up plan with the patient.    Controlled Substance Database was reviewed by me.  This patient was seen by Mardy Maxin, FNP-C in collaboration with Dr. Sigrid Bathe as a part of collaborative care agreement.  Oksana Deberry R. Maxin, MSN, FNP-C Internal medicine

## 2023-08-09 DIAGNOSIS — E782 Mixed hyperlipidemia: Secondary | ICD-10-CM | POA: Diagnosis not present

## 2023-08-09 DIAGNOSIS — E538 Deficiency of other specified B group vitamins: Secondary | ICD-10-CM | POA: Diagnosis not present

## 2023-08-09 DIAGNOSIS — Z0001 Encounter for general adult medical examination with abnormal findings: Secondary | ICD-10-CM | POA: Diagnosis not present

## 2023-08-09 DIAGNOSIS — E559 Vitamin D deficiency, unspecified: Secondary | ICD-10-CM | POA: Diagnosis not present

## 2023-08-10 LAB — CBC WITH DIFFERENTIAL/PLATELET
Basophils Absolute: 0.1 10*3/uL (ref 0.0–0.2)
Basos: 2 %
EOS (ABSOLUTE): 0.4 10*3/uL (ref 0.0–0.4)
Eos: 8 %
Hematocrit: 43 % (ref 34.0–46.6)
Hemoglobin: 14.4 g/dL (ref 11.1–15.9)
Immature Grans (Abs): 0 10*3/uL (ref 0.0–0.1)
Immature Granulocytes: 0 %
Lymphocytes Absolute: 1.5 10*3/uL (ref 0.7–3.1)
Lymphs: 32 %
MCH: 30.3 pg (ref 26.6–33.0)
MCHC: 33.5 g/dL (ref 31.5–35.7)
MCV: 91 fL (ref 79–97)
Monocytes Absolute: 0.4 10*3/uL (ref 0.1–0.9)
Monocytes: 9 %
Neutrophils Absolute: 2.3 10*3/uL (ref 1.4–7.0)
Neutrophils: 49 %
Platelets: 275 10*3/uL (ref 150–450)
RBC: 4.75 x10E6/uL (ref 3.77–5.28)
RDW: 11.8 % (ref 11.7–15.4)
WBC: 4.7 10*3/uL (ref 3.4–10.8)

## 2023-08-10 LAB — LIPID PANEL
Chol/HDL Ratio: 2.8 ratio (ref 0.0–4.4)
Cholesterol, Total: 185 mg/dL (ref 100–199)
HDL: 65 mg/dL (ref >39)
LDL Chol Calc (NIH): 109 mg/dL — ABNORMAL HIGH (ref 0–99)
Triglycerides: 58 mg/dL (ref 0–149)
VLDL Cholesterol Cal: 11 mg/dL (ref 5–40)

## 2023-08-10 LAB — CMP14+EGFR
ALT: 16 IU/L (ref 0–32)
AST: 18 IU/L (ref 0–40)
Albumin: 4.2 g/dL (ref 3.9–4.9)
Alkaline Phosphatase: 72 IU/L (ref 44–121)
BUN/Creatinine Ratio: 18 (ref 9–23)
BUN: 13 mg/dL (ref 6–24)
Bilirubin Total: 0.4 mg/dL (ref 0.0–1.2)
CO2: 23 mmol/L (ref 20–29)
Calcium: 9.4 mg/dL (ref 8.7–10.2)
Chloride: 101 mmol/L (ref 96–106)
Creatinine, Ser: 0.74 mg/dL (ref 0.57–1.00)
Globulin, Total: 2.3 g/dL (ref 1.5–4.5)
Glucose: 82 mg/dL (ref 70–99)
Potassium: 5.1 mmol/L (ref 3.5–5.2)
Sodium: 140 mmol/L (ref 134–144)
Total Protein: 6.5 g/dL (ref 6.0–8.5)
eGFR: 100 mL/min/{1.73_m2} (ref >59)

## 2023-08-10 LAB — VITAMIN D 25 HYDROXY (VIT D DEFICIENCY, FRACTURES): Vit D, 25-Hydroxy: 30.6 ng/mL (ref 30.0–100.0)

## 2023-08-10 LAB — B12 AND FOLATE PANEL
Folate: 5.9 ng/mL (ref >3.0)
Vitamin B-12: 802 pg/mL (ref 232–1245)

## 2023-08-28 LAB — COLOGUARD

## 2023-09-10 ENCOUNTER — Ambulatory Visit: Admitting: Nurse Practitioner

## 2023-10-31 ENCOUNTER — Ambulatory Visit: Admitting: Nurse Practitioner

## 2023-11-01 ENCOUNTER — Telehealth: Payer: Self-pay | Admitting: Nurse Practitioner

## 2023-11-01 NOTE — Telephone Encounter (Signed)
 Lvm & sent message to reschedule 10/31/2023 missed appointment-Toni

## 2023-11-11 ENCOUNTER — Ambulatory Visit (INDEPENDENT_AMBULATORY_CARE_PROVIDER_SITE_OTHER): Admitting: Nurse Practitioner

## 2023-11-11 ENCOUNTER — Encounter: Payer: Self-pay | Admitting: Nurse Practitioner

## 2023-11-11 VITALS — BP 122/80 | HR 80 | Temp 98.3°F | Resp 16 | Ht 71.0 in | Wt 190.4 lb

## 2023-11-11 DIAGNOSIS — Z1211 Encounter for screening for malignant neoplasm of colon: Secondary | ICD-10-CM

## 2023-11-11 DIAGNOSIS — R109 Unspecified abdominal pain: Secondary | ICD-10-CM | POA: Diagnosis not present

## 2023-11-11 DIAGNOSIS — Z1212 Encounter for screening for malignant neoplasm of rectum: Secondary | ICD-10-CM

## 2023-11-11 DIAGNOSIS — E782 Mixed hyperlipidemia: Secondary | ICD-10-CM | POA: Diagnosis not present

## 2023-11-11 DIAGNOSIS — Z79899 Other long term (current) drug therapy: Secondary | ICD-10-CM

## 2023-11-11 DIAGNOSIS — R41840 Attention and concentration deficit: Secondary | ICD-10-CM

## 2023-11-11 LAB — POCT URINE DRUG SCREEN
Methylenedioxyamphetamine: NOT DETECTED
POC Amphetamine UR: POSITIVE — AB
POC BENZODIAZEPINES UR: NOT DETECTED
POC Barbiturate UR: NOT DETECTED
POC Cocaine UR: NOT DETECTED
POC Ecstasy UR: NOT DETECTED
POC Marijuana UR: NOT DETECTED
POC Methadone UR: NOT DETECTED
POC Methamphetamine UR: NOT DETECTED
POC Opiate Ur: NOT DETECTED
POC Oxycodone UR: NOT DETECTED
POC PHENCYCLIDINE UR: NOT DETECTED
POC TRICYCLICS UR: NOT DETECTED

## 2023-11-11 MED ORDER — AMPHETAMINE-DEXTROAMPHETAMINE 10 MG PO TABS: ORAL_TABLET | ORAL | 0 refills | Status: DC

## 2023-11-11 MED ORDER — IBUPROFEN 800 MG PO TABS: ORAL_TABLET | ORAL | 0 refills | Status: DC

## 2023-11-11 NOTE — Progress Notes (Signed)
 [facility] [address] [place], [place] [address]  Internal MEDICINE  Office Visit Note  Patient Name: Deanna Sims  [id number]  [id number]  Date of Service: 11/11/2023  Chief Complaint  Patient presents with   Follow-up    HPI Deanna Sims presents for a follow-up visit for lab results, ADHD and refills.  Needs to redo the cologuard test.  Reviewed labs -- most of her labs are grossly normal.  High cholesterol -- cholesterol levels continue to improve. ADHD -- current dose is effective. BP and heart rate are normal. Denies any palpitations or other adverse side effects of the medication. Due for refills.     Current Medication: Outpatient Encounter Medications as of 11/11/2023  Medication Sig   [START ON 01/06/2024] amphetamine -dextroamphetamine  (ADDERALL) 10 MG tablet Take 1 tablet by mouth in the morning and take 1 tablet by mouth between noon and 2pm.   [START ON 12/09/2023] amphetamine -dextroamphetamine  (ADDERALL) 10 MG tablet Take 1 tablet by mouth in the morning and take 1 tablet by mouth between noon and 2pm.   amphetamine -dextroamphetamine  (ADDERALL) 10 MG tablet Take 1 tablet by mouth in the morning and take 1 tablet by mouth between noon and 2pm.   cetirizine  (ZYRTEC ) 10 MG tablet Take 1 tablet (10 mg total) by mouth daily.   fluticasone (FLONASE) 50 MCG/ACT nasal spray Place 1 spray into both nostrils daily as needed for allergies or rhinitis.   ibuprofen  (ADVIL ) 800 MG tablet TAKE 1 TABLET BY MOUTH EVERY 8 HOURS AS NEEDED FOR MILD PAIN, MODERATE PAIN OR CRAMPING.   ondansetron  (ZOFRAN ) 4 MG tablet Take 1 tablet (4 mg total) by mouth every 8 (eight) hours as needed for nausea or vomiting.   sertraline  (ZOLOFT ) 50 MG tablet TAKE 1 TABLET BY MOUTH EVERY DAY   SPRINTEC 28 0.25-35 MG-MCG tablet Take 1 tablet by mouth daily.   [DISCONTINUED] amphetamine -dextroamphetamine  (ADDERALL) 10 MG tablet Take 1 tablet by mouth in the morning and take 1 tablet by mouth between  noon and 2pm.   [DISCONTINUED] amphetamine -dextroamphetamine  (ADDERALL) 10 MG tablet Take 1 tablet by mouth in the morning and take 1 tablet by mouth between noon and 2pm.   [DISCONTINUED] amphetamine -dextroamphetamine  (ADDERALL) 10 MG tablet Take 1 tablet by mouth in the morning and take 1 tablet by mouth between noon and 2pm.   [DISCONTINUED] ibuprofen  (ADVIL ) 800 MG tablet TAKE 1 TABLET BY MOUTH EVERY 8 HOURS AS NEEDED FOR MILD PAIN, MODERATE PAIN OR CRAMPING.   [DISCONTINUED] zolpidem  (AMBIEN ) 10 MG tablet Take one tab po qhs as needed, may take one extra if needed   No facility-administered encounter medications on file as of 11/11/2023.    Surgical History: Past Surgical History:  Procedure Laterality Date   CESAREAN SECTION N/A 08/11/2015   Procedure: Primary CESAREAN SECTION;  Surgeon: Charlie Flowers, MD;  Location: [facility] BIRTHING SUITES;  Service: Obstetrics;  Laterality: N/A;  EDD: 08/17/15 Allergy: PCN   CESAREAN SECTION N/A 08/14/2017   Procedure: Repeat CESAREAN SECTION;  Surgeon: Flowers Charlie, MD;  Location: [facility] BIRTHING SUITES;  Service: Obstetrics;  Laterality: N/A;  EDD: 08/20/17 Allergy: Penicillin   DILATION AND EVACUATION N/A 03/15/2014   Procedure: DILATATION AND EVACUATION;  Surgeon: Ronal Elvie Pinal, MD;  Location: [facility];  Service: Gynecology;  Laterality: N/A;   LAPAROSCOPY N/A 03/15/2014   Procedure: LAPAROSCOPY OPERATIVE;  Surgeon: Ronal Elvie Pinal, MD;  Location: [facility];  Service: Gynecology;  Laterality: N/A;   WISDOM TOOTH EXTRACTION      Medical History:  Past Medical History:  Diagnosis Date   ADHD    Bell palsy    Insomnia    Postpartum care following repeat cesarean delivery (6/29) 08/12/2015   Seasonal allergies    Sinusitis     Family History: Family History  Problem Relation Age of Onset   Uterine cancer Mother 70   Cancer - Ovarian Maternal Grandmother    Cancer - Lung Maternal Grandmother    Cancer - Lung Maternal Grandfather     Social  History   Socioeconomic History   Marital status: Married    Spouse name: Not on file   Number of children: Not on file   Years of education: Not on file   Highest education level: Not on file  Occupational History   Not on file  Tobacco Use   Smoking status: Former    Current packs/day: 0.00    Average packs/day: 1 pack/day for 13.0 years (13.0 ttl pk-yrs)    Types: Cigarettes    Start date: 08/12/2000    Quit date: 08/12/2013    Years since quitting: 10.2   Smokeless tobacco: Never  Vaping Use   Vaping status: Never Used  Substance and Sexual Activity   Alcohol use: Yes    Alcohol/week: 5.0 standard drinks of alcohol    Types: 5 Glasses of wine per week    Comment: ocassionally   Drug use: No   Sexual activity: Yes    Partners: Male    Birth control/protection: None    Comment: approx 7-[redacted] wks gestation?? per patient  Other Topics Concern   Not on file  Social History Narrative   Not on file   Social Drivers of Health   Financial Resource Strain: Not on file  Food Insecurity: Not on file  Transportation Needs: Not on file  Physical Activity: Not on file  Stress: Not on file  Social Connections: Not on file  Intimate Partner Violence: Not on file      Review of Systems  Constitutional:  Positive for fatigue. Negative for chills and unexpected weight change.  HENT:  Negative for congestion, postnasal drip, rhinorrhea, sneezing and sore throat.   Eyes:  Negative for redness.  Respiratory: Negative.  Negative for cough, chest tightness, shortness of breath and wheezing.   Cardiovascular: Negative.  Negative for chest pain and palpitations.  Gastrointestinal:  Negative for abdominal pain, constipation, diarrhea, nausea and vomiting.  Genitourinary:  Negative for dysuria and frequency.  Musculoskeletal:  Negative for arthralgias, back pain, joint swelling and neck pain.  Skin:  Negative for rash.  Neurological: Negative.  Negative for tremors and numbness.   Hematological:  Negative for adenopathy. Does not bruise/bleed easily.  Psychiatric/Behavioral:  Positive for behavioral problems (Depression), decreased concentration and sleep disturbance. Negative for self-injury and suicidal ideas. The patient is not nervous/anxious.     Vital Signs: BP 122/80   Pulse 80   Temp 98.3 F (36.8 C)   Resp 16   Ht 5' 11 (1.803 m)   Wt 190 lb 6.4 oz (86.4 kg)   SpO2 96%   BMI 26.56 kg/m    Physical Exam Vitals reviewed.  Constitutional:      General: She is not in acute distress.    Appearance: Normal appearance. She is not ill-appearing.  HENT:     Head: Normocephalic and atraumatic.  Eyes:     Pupils: Pupils are equal, round, and reactive to light.  Cardiovascular:     Rate and Rhythm: Normal rate and regular  rhythm.  Pulmonary:     Effort: Pulmonary effort is normal. No respiratory distress.  Neurological:     Mental Status: She is alert and oriented to person, place, and time.  Psychiatric:        Mood and Affect: Mood normal.        Behavior: Behavior normal.        Assessment/Plan: 1. Mixed hyperlipidemia (Primary) Not currently on statin therapy. Instructed patient to limit red meat intake, increase lean protein intake and increase physical activity as tolerated.   2. Abdominal cramping Continue prn ibuprofen  for abdominal cramping/menstrual cramping as prescribed.  - ibuprofen  (ADVIL ) 800 MG tablet; TAKE 1 TABLET BY MOUTH EVERY 8 HOURS AS NEEDED FOR MILD PAIN, MODERATE PAIN OR CRAMPING.  Dispense: 90 tablet; Refill: 0  3. Encounter for long-term (current) use of medications UDS done, positive for amphetamine  which is consistent with her current prescriptions. - POCT Urine Drug Screen  4. Screening for colorectal cancer Routine cologuard test ordered  - Cologuard  5. Attention and concentration deficit Continue adderall as prescribed. Follow up in 3 months for additional refills.  - amphetamine -dextroamphetamine   (ADDERALL) 10 MG tablet; Take 1 tablet by mouth in the morning and take 1 tablet by mouth between noon and 2pm.  Dispense: 60 tablet; Refill: 0 - amphetamine -dextroamphetamine  (ADDERALL) 10 MG tablet; Take 1 tablet by mouth in the morning and take 1 tablet by mouth between noon and 2pm.  Dispense: 60 tablet; Refill: 0 - amphetamine -dextroamphetamine  (ADDERALL) 10 MG tablet; Take 1 tablet by mouth in the morning and take 1 tablet by mouth between noon and 2pm.  Dispense: 60 tablet; Refill: 0   General Counseling: Christalynn verbalizes understanding of the findings of todays visit and agrees with plan of treatment. I have discussed any further diagnostic evaluation that may be needed or ordered today. We also reviewed her medications today. she has been encouraged to call the office with any questions or concerns that should arise related to todays visit.    Orders Placed This Encounter  Procedures   Cologuard   POCT Urine Drug Screen    Meds ordered this encounter  Medications   amphetamine -dextroamphetamine  (ADDERALL) 10 MG tablet    Sig: Take 1 tablet by mouth in the morning and take 1 tablet by mouth between noon and 2pm.    Dispense:  60 tablet    Refill:  0    Fill for nov 24   amphetamine -dextroamphetamine  (ADDERALL) 10 MG tablet    Sig: Take 1 tablet by mouth in the morning and take 1 tablet by mouth between noon and 2pm.    Dispense:  60 tablet    Refill:  0    Fill for oct 27   amphetamine -dextroamphetamine  (ADDERALL) 10 MG tablet    Sig: Take 1 tablet by mouth in the morning and take 1 tablet by mouth between noon and 2pm.    Dispense:  60 tablet    Refill:  0    Fill for sept 29   ibuprofen  (ADVIL ) 800 MG tablet    Sig: TAKE 1 TABLET BY MOUTH EVERY 8 HOURS AS NEEDED FOR MILD PAIN, MODERATE PAIN OR CRAMPING.    Dispense:  90 tablet    Refill:  0    For future refills    Return in about 3 months (around 02/04/2024) for F/U, ADHD med check, Jhanvi Drakeford PCP.   Total time  spent:30 Minutes Time spent includes review of chart, medications, test results, and follow up  plan with the patient.   [place] Controlled Substance Database was reviewed by me.  This patient was seen by Mardy Maxin, FNP-C in collaboration with Dr. Sigrid Bathe as a part of collaborative care agreement.   Jahden Schara R. Maxin, MSN, FNP-C Internal medicine

## 2023-11-27 ENCOUNTER — Encounter: Payer: Self-pay | Admitting: Nurse Practitioner

## 2023-12-02 ENCOUNTER — Other Ambulatory Visit: Payer: Self-pay | Admitting: Nurse Practitioner

## 2024-02-03 ENCOUNTER — Encounter: Payer: Self-pay | Admitting: Nurse Practitioner

## 2024-02-03 ENCOUNTER — Ambulatory Visit (INDEPENDENT_AMBULATORY_CARE_PROVIDER_SITE_OTHER): Admitting: Nurse Practitioner

## 2024-02-03 VITALS — BP 130/84 | HR 80 | Temp 98.1°F | Resp 16 | Ht 71.0 in | Wt 184.0 lb

## 2024-02-03 DIAGNOSIS — Z6825 Body mass index (BMI) 25.0-25.9, adult: Secondary | ICD-10-CM | POA: Diagnosis not present

## 2024-02-03 DIAGNOSIS — N951 Menopausal and female climacteric states: Secondary | ICD-10-CM | POA: Diagnosis not present

## 2024-02-03 DIAGNOSIS — R41840 Attention and concentration deficit: Secondary | ICD-10-CM

## 2024-02-03 DIAGNOSIS — E663 Overweight: Secondary | ICD-10-CM | POA: Diagnosis not present

## 2024-02-03 MED ORDER — AMPHETAMINE-DEXTROAMPHETAMINE 10 MG PO TABS: ORAL_TABLET | ORAL | 0 refills | Status: AC

## 2024-02-03 MED ORDER — WEGOVY 1 MG/0.5ML ~~LOC~~ SOAJ: 1.0000 mg | SUBCUTANEOUS | 1 refills | Status: DC

## 2024-02-03 NOTE — Progress Notes (Signed)
 [facility] [address] [place], [place] [address]  Internal MEDICINE  Office Visit Note  Patient Name: Deanna Sims  [id number]  [id number]  Date of Service: 02/03/2024  Chief Complaint  Patient presents with   Follow-up    HPI Deanna Sims presents for a follow-up visit for perimenopause, ADHD, and weight loss.  Vasomotor symptoms -- taking OCP and sertraline .  ADHD -- current dose of adderall is effective. BP and heart rate are normal.  Weight loss -- wants to try wegovy  through mail order novocare.     Current Medication: Outpatient Encounter Medications as of 02/03/2024  Medication Sig   semaglutide -weight management (WEGOVY ) 1 MG/0.5ML SOAJ SQ injection Inject 1 mg into the skin once a week.   [START ON 03/30/2024] amphetamine -dextroamphetamine  (ADDERALL) 10 MG tablet Take 1 tablet by mouth in the morning and take 1 tablet by mouth between noon and 2pm.   [START ON 03/02/2024] amphetamine -dextroamphetamine  (ADDERALL) 10 MG tablet Take 1 tablet by mouth in the morning and take 1 tablet by mouth between noon and 2pm.   amphetamine -dextroamphetamine  (ADDERALL) 10 MG tablet Take 1 tablet by mouth in the morning and take 1 tablet by mouth between noon and 2pm.   cetirizine  (ZYRTEC ) 10 MG tablet Take 1 tablet (10 mg total) by mouth daily. (Patient not taking: Reported on 02/03/2024)   fluticasone (FLONASE) 50 MCG/ACT nasal spray Place 1 spray into both nostrils daily as needed for allergies or rhinitis.   meloxicam (MOBIC) 15 MG tablet TAKE 1 TABLET BY MOUTH EVERY DAY WITH FOOD   ondansetron  (ZOFRAN ) 4 MG tablet Take 1 tablet (4 mg total) by mouth every 8 (eight) hours as needed for nausea or vomiting. (Patient not taking: Reported on 02/03/2024)   sertraline  (ZOLOFT ) 50 MG tablet TAKE 1 TABLET BY MOUTH EVERY DAY   SPRINTEC 28 0.25-35 MG-MCG tablet Take 1 tablet by mouth daily.   [DISCONTINUED] amphetamine -dextroamphetamine  (ADDERALL) 10 MG tablet Take 1 tablet by mouth in  the morning and take 1 tablet by mouth between noon and 2pm.   [DISCONTINUED] amphetamine -dextroamphetamine  (ADDERALL) 10 MG tablet Take 1 tablet by mouth in the morning and take 1 tablet by mouth between noon and 2pm.   [DISCONTINUED] amphetamine -dextroamphetamine  (ADDERALL) 10 MG tablet Take 1 tablet by mouth in the morning and take 1 tablet by mouth between noon and 2pm.   No facility-administered encounter medications on file as of 02/03/2024.    Surgical History: Past Surgical History:  Procedure Laterality Date   CESAREAN SECTION N/A 08/11/2015   Procedure: Primary CESAREAN SECTION;  Surgeon: Charlie Flowers, MD;  Location: [facility] BIRTHING SUITES;  Service: Obstetrics;  Laterality: N/A;  EDD: 08/17/15 Allergy: PCN   CESAREAN SECTION N/A 08/14/2017   Procedure: Repeat CESAREAN SECTION;  Surgeon: Flowers Charlie, MD;  Location: [facility] BIRTHING SUITES;  Service: Obstetrics;  Laterality: N/A;  EDD: 08/20/17 Allergy: Penicillin   DILATION AND EVACUATION N/A 03/15/2014   Procedure: DILATATION AND EVACUATION;  Surgeon: Ronal Elvie Pinal, MD;  Location: [facility];  Service: Gynecology;  Laterality: N/A;   LAPAROSCOPY N/A 03/15/2014   Procedure: LAPAROSCOPY OPERATIVE;  Surgeon: Ronal Elvie Pinal, MD;  Location: [facility];  Service: Gynecology;  Laterality: N/A;   WISDOM TOOTH EXTRACTION      Medical History: Past Medical History:  Diagnosis Date   ADHD    Bell palsy    Insomnia    Postpartum care following repeat cesarean delivery (6/29) 08/12/2015   Seasonal allergies    Sinusitis  Family History: Family History  Problem Relation Age of Onset   Uterine cancer Mother 78   Cancer - Ovarian Maternal Grandmother    Cancer - Lung Maternal Grandmother    Cancer - Lung Maternal Grandfather     Social History   Socioeconomic History   Marital status: Married    Spouse name: Not on file   Number of children: Not on file   Years of education: Not on file   Highest education level: Not on file   Occupational History   Not on file  Tobacco Use   Smoking status: Former    Current packs/day: 0.00    Average packs/day: 1 pack/day for 13.0 years (13.0 ttl pk-yrs)    Types: Cigarettes    Start date: 08/12/2000    Quit date: 08/12/2013    Years since quitting: 10.4   Smokeless tobacco: Never  Vaping Use   Vaping status: Never Used  Substance and Sexual Activity   Alcohol use: Yes    Alcohol/week: 5.0 standard drinks of alcohol    Types: 5 Glasses of wine per week    Comment: ocassionally   Drug use: No   Sexual activity: Yes    Partners: Male    Birth control/protection: None    Comment: approx 7-[redacted] wks gestation?? per patient  Other Topics Concern   Not on file  Social History Narrative   Not on file   Social Drivers of Health   Tobacco Use: Medium Risk (02/03/2024)   Patient History    Smoking Tobacco Use: Former    Smokeless Tobacco Use: Never    Passive Exposure: Not on file  Financial Resource Strain: Not on file  Food Insecurity: Not on file  Transportation Needs: Not on file  Physical Activity: Not on file  Stress: Not on file  Social Connections: Not on file  Intimate Partner Violence: Not on file  Depression (PHQ2-9): Low Risk (08/06/2023)   Depression (PHQ2-9)    PHQ-2 Score: 0  Alcohol Screen: Low Risk (09/12/2021)   Alcohol Screen    Last Alcohol Screening Score (AUDIT): 2  Housing: Not on file  Utilities: Not on file  Health Literacy: Not on file      Review of Systems  Constitutional:  Positive for fatigue. Negative for chills and unexpected weight change.  HENT:  Negative for congestion, postnasal drip, rhinorrhea, sneezing and sore throat.   Eyes:  Negative for redness.  Respiratory: Negative.  Negative for cough, chest tightness, shortness of breath and wheezing.   Cardiovascular: Negative.  Negative for chest pain and palpitations.  Gastrointestinal:  Negative for abdominal pain, constipation, diarrhea, nausea and vomiting.  Genitourinary:   Negative for dysuria and frequency.  Musculoskeletal:  Negative for arthralgias, back pain, joint swelling and neck pain.  Skin:  Negative for rash.  Neurological: Negative.  Negative for tremors and numbness.  Hematological:  Negative for adenopathy. Does not bruise/bleed easily.  Psychiatric/Behavioral:  Positive for behavioral problems (Depression), decreased concentration and sleep disturbance. Negative for self-injury and suicidal ideas. The patient is not nervous/anxious.     Vital Signs: BP 130/84   Pulse 80   Temp 98.1 F (36.7 C)   Resp 16   Ht 5' 11 (1.803 m)   Wt 184 lb (83.5 kg)   SpO2 99%   BMI 25.66 kg/m    Physical Exam Vitals reviewed.  Constitutional:      General: She is not in acute distress.    Appearance: Normal appearance. She is  not ill-appearing.  HENT:     Head: Normocephalic and atraumatic.  Eyes:     Pupils: Pupils are equal, round, and reactive to light.  Cardiovascular:     Rate and Rhythm: Normal rate and regular rhythm.  Pulmonary:     Effort: Pulmonary effort is normal. No respiratory distress.  Neurological:     Mental Status: She is alert and oriented to person, place, and time.  Psychiatric:        Mood and Affect: Mood normal.        Behavior: Behavior normal.        Assessment/Plan: 1. Vasomotor symptoms due to menopause (Primary) Continue OCP and sertraline  as prescribed.   2. Attention and concentration deficit Continue adderall as prescribed. Follow up in 3 months for additional refills. UDS due at net visit.  - amphetamine -dextroamphetamine  (ADDERALL) 10 MG tablet; Take 1 tablet by mouth in the morning and take 1 tablet by mouth between noon and 2pm.  Dispense: 60 tablet; Refill: 0 - amphetamine -dextroamphetamine  (ADDERALL) 10 MG tablet; Take 1 tablet by mouth in the morning and take 1 tablet by mouth between noon and 2pm.  Dispense: 60 tablet; Refill: 0 - amphetamine -dextroamphetamine  (ADDERALL) 10 MG tablet; Take 1  tablet by mouth in the morning and take 1 tablet by mouth between noon and 2pm.  Dispense: 60 tablet; Refill: 0  3. Overweight with body mass index (BMI) of 25 to 25.9 in adult Continue wegovy  as prescribed.  - semaglutide -weight management (WEGOVY ) 1 MG/0.5ML SOAJ SQ injection; Inject 1 mg into the skin once a week.  Dispense: 2 mL; Refill: 1   General Counseling: Deanna Sims verbalizes understanding of the findings of todays visit and agrees with plan of treatment. I have discussed any further diagnostic evaluation that may be needed or ordered today. We also reviewed her medications today. she has been encouraged to call the office with any questions or concerns that should arise related to todays visit.    No orders of the defined types were placed in this encounter.   Meds ordered this encounter  Medications   amphetamine -dextroamphetamine  (ADDERALL) 10 MG tablet    Sig: Take 1 tablet by mouth in the morning and take 1 tablet by mouth between noon and 2pm.    Dispense:  60 tablet    Refill:  0    Fill for February   amphetamine -dextroamphetamine  (ADDERALL) 10 MG tablet    Sig: Take 1 tablet by mouth in the morning and take 1 tablet by mouth between noon and 2pm.    Dispense:  60 tablet    Refill:  0    Fill for january   amphetamine -dextroamphetamine  (ADDERALL) 10 MG tablet    Sig: Take 1 tablet by mouth in the morning and take 1 tablet by mouth between noon and 2pm.    Dispense:  60 tablet    Refill:  0    Fill for December   semaglutide -weight management (WEGOVY ) 1 MG/0.5ML SOAJ SQ injection    Sig: Inject 1 mg into the skin once a week.    Dispense:  2 mL    Refill:  1    Fill new script today    Return in about 1 month (around 03/05/2024) for F/U, eval new med, Deanna Sims PCP.   Total time spent:30 Minutes Time spent includes review of chart, medications, test results, and follow up plan with the patient.   [place] Controlled Substance Database was reviewed by me.  This patient  was seen  by Mardy Maxin, FNP-C in collaboration with Dr. Sigrid Bathe as a part of collaborative care agreement.   Deanna Bickle R. Maxin, MSN, FNP-C Internal medicine

## 2024-02-15 ENCOUNTER — Encounter: Payer: Self-pay | Admitting: Nurse Practitioner

## 2024-03-05 ENCOUNTER — Encounter: Payer: Self-pay | Admitting: Nurse Practitioner

## 2024-03-05 ENCOUNTER — Ambulatory Visit: Admitting: Nurse Practitioner

## 2024-03-05 VITALS — BP 130/76 | HR 82 | Temp 97.6°F | Resp 16 | Ht 71.0 in | Wt 185.2 lb

## 2024-03-05 DIAGNOSIS — Z6825 Body mass index (BMI) 25.0-25.9, adult: Secondary | ICD-10-CM | POA: Diagnosis not present

## 2024-03-05 DIAGNOSIS — R41840 Attention and concentration deficit: Secondary | ICD-10-CM | POA: Diagnosis not present

## 2024-03-05 DIAGNOSIS — E663 Overweight: Secondary | ICD-10-CM

## 2024-03-05 DIAGNOSIS — N951 Menopausal and female climacteric states: Secondary | ICD-10-CM

## 2024-03-05 MED ORDER — SEMAGLUTIDE-WEIGHT MANAGEMENT 1.7 MG/0.75ML ~~LOC~~ SOAJ: 1.7000 mg | SUBCUTANEOUS | 2 refills | Status: AC

## 2024-03-05 NOTE — Progress Notes (Signed)
 [facility] [address] [place], [place] [address]  Internal MEDICINE  Office Visit Note  Patient Name: Deanna Sims  [id number]  [id number]  Date of Service: 03/05/2024  Chief Complaint  Patient presents with   Follow-up    HPI Deanna Sims presents for a follow-up visit for weight loss, perimenopause and ADHD.  Weight loss -- switched to wegovy  but does is not equal to what she was on of zepbound . Will increase dose.  Vasomotor symptoms are controlled with sertraline  ADHD -- taking adderall daily.     Current Medication: Outpatient Encounter Medications as of 03/05/2024  Medication Sig   semaglutide -weight management (WEGOVY ) 1.7 MG/0.75ML SOAJ SQ injection Inject 1.7 mg into the skin once a week.   [START ON 03/30/2024] amphetamine -dextroamphetamine  (ADDERALL) 10 MG tablet Take 1 tablet by mouth in the morning and take 1 tablet by mouth between noon and 2pm.   amphetamine -dextroamphetamine  (ADDERALL) 10 MG tablet Take 1 tablet by mouth in the morning and take 1 tablet by mouth between noon and 2pm.   amphetamine -dextroamphetamine  (ADDERALL) 10 MG tablet Take 1 tablet by mouth in the morning and take 1 tablet by mouth between noon and 2pm.   fluticasone (FLONASE) 50 MCG/ACT nasal spray Place 1 spray into both nostrils daily as needed for allergies or rhinitis.   meloxicam (MOBIC) 15 MG tablet TAKE 1 TABLET BY MOUTH EVERY DAY WITH FOOD   sertraline  (ZOLOFT ) 50 MG tablet TAKE 1 TABLET BY MOUTH EVERY DAY   SPRINTEC 28 0.25-35 MG-MCG tablet Take 1 tablet by mouth daily.   [DISCONTINUED] semaglutide -weight management (WEGOVY ) 1 MG/0.5ML SOAJ SQ injection Inject 1 mg into the skin once a week.   No facility-administered encounter medications on file as of 03/05/2024.    Surgical History: Past Surgical History:  Procedure Laterality Date   CESAREAN SECTION N/A 08/11/2015   Procedure: Primary CESAREAN SECTION;  Surgeon: Charlie Flowers, MD;  Location: [facility] BIRTHING SUIT[facility];   Service: Obstetrics;  Laterality: N/A;  EDD: 08/17/15 Allergy: PCN   CESAREAN SECTION N/A 08/14/2017   Procedure: Repeat CESAREAN SECTION;  Surgeon: Flowers Charlie, MD;  Location: [facility] BIRTHING SUITES;  Service: Obstetrics;  Laterality: N/A;  EDD: 08/20/17 Allergy: Penicillin   DILATION AND EVACUATION N/A 03/15/2014   Procedure: DILATATION AND EVACUATION;  Surgeon: Ronal Elvie Pinal, MD;  Location: [facility] OR[facility];  Service: Gynecology;  Laterality: N/A;   LAPAROSCOPY N/A 03/15/2014   Procedure: LAPAROSCOPY OPERATIVE;  Surgeon: Ronal Elvie Pinal, MD;  Location: [facility];  Service: Gynecology;  Laterality: N/A;   WISDOM TOOTH EXTRACTION      Medical History: Past Medical History:  Diagnosis Date   ADHD    Bell palsy    Insomnia    Postpartum care following repeat cesarean delivery (6/29) 08/12/2015   Seasonal allergies    Sinusitis     Family History: Family History  Problem Relation Age of Onset   Uterine cancer Mother 60   Cancer - Ovarian Maternal Grandmother    Cancer - Lung Maternal Grandmother    Cancer - Lung Maternal Grandfather     Social History   Socioeconomic History   Marital status: Married    Spouse name: Not on file   Number of children: Not on file   Years of education: Not on file   Highest education level: Not on file  Occupational History   Not on file  Tobacco Use   Smoking status: Former    Current packs/day: 0.00    Average packs/day: 1 pack/day  for 13.0 years (13.0 ttl pk-yrs)    Types: Cigarettes    Start date: 08/12/2000    Quit date: 08/12/2013    Years since quitting: 10.5   Smokeless tobacco: Never  Vaping Use   Vaping status: Never Used  Substance and Sexual Activity   Alcohol use: Yes    Alcohol/week: 5.0 standard drinks of alcohol    Types: 5 Glasses of wine per week    Comment: ocassionally   Drug use: No   Sexual activity: Yes    Partners: Male    Birth control/protection: None    Comment: approx 7-[redacted] wks gestation?? per patient  Other Topics  Concern   Not on file  Social History Narrative   Not on file   Social Drivers of Health   Tobacco Use: Medium Risk (03/05/2024)   Patient History    Smoking Tobacco Use: Former    Smokeless Tobacco Use: Never    Passive Exposure: Not on file  Financial Resource Strain: Not on file  Food Insecurity: Not on file  Transportation Needs: Not on file  Physical Activity: Not on file  Stress: Not on file  Social Connections: Not on file  Intimate Partner Violence: Not on file  Depression (PHQ2-9): Low Risk (08/06/2023)   Depression (PHQ2-9)    PHQ-2 Score: 0  Alcohol Screen: Low Risk (09/12/2021)   Alcohol Screen    Last Alcohol Screening Score (AUDIT): 2  Housing: Not on file  Utilities: Not on file  Health Literacy: Not on file      Review of Systems  Constitutional:  Positive for fatigue. Negative for chills and unexpected weight change.  HENT:  Negative for congestion, postnasal drip, rhinorrhea, sneezing and sore throat.   Eyes:  Negative for redness.  Respiratory: Negative.  Negative for cough, chest tightness, shortness of breath and wheezing.   Cardiovascular: Negative.  Negative for chest pain and palpitations.  Gastrointestinal:  Negative for abdominal pain, constipation, diarrhea, nausea and vomiting.  Genitourinary:  Negative for dysuria and frequency.  Musculoskeletal:  Negative for arthralgias, back pain, joint swelling and neck pain.  Skin:  Negative for rash.  Neurological: Negative.  Negative for tremors and numbness.  Hematological:  Negative for adenopathy. Does not bruise/bleed easily.  Psychiatric/Behavioral:  Positive for behavioral problems (Depression), decreased concentration and sleep disturbance. Negative for self-injury and suicidal ideas. The patient is not nervous/anxious.     Vital Signs: BP 130/76   Pulse 82   Temp 97.6 F (36.4 C)   Resp 16   Ht 5' 11 (1.803 m)   Wt 185 lb 3.2 oz (84 kg)   SpO2 99%   BMI 25.83 kg/m    Physical  Exam Vitals reviewed.  Constitutional:      General: She is not in acute distress.    Appearance: Normal appearance. She is not ill-appearing.  HENT:     Head: Normocephalic and atraumatic.  Eyes:     Pupils: Pupils are equal, round, and reactive to light.  Cardiovascular:     Rate and Rhythm: Normal rate and regular rhythm.  Pulmonary:     Effort: Pulmonary effort is normal. No respiratory distress.  Neurological:     Mental Status: She is alert and oriented to person, place, and time.  Psychiatric:        Mood and Affect: Mood normal.        Behavior: Behavior normal.        Assessment/Plan: 1. Vasomotor symptoms due to menopause (Primary) Continue  sertraline  as prescribed.   2. Attention and concentration deficit Continue adderall as prescribed.   3. Overweight with body mass index (BMI) of 25 to 25.9 in adult Continue wegovy  at increased dose 1.7 mg weekly. Follow up in 2 months.  - semaglutide -weight management (WEGOVY ) 1.7 MG/0.75ML SOAJ SQ injection; Inject 1.7 mg into the skin once a week.  Dispense: 3 mL; Refill: 2   General Counseling: Deanna Sims verbalizes understanding of the findings of todays visit and agrees with plan of treatment. I have discussed any further diagnostic evaluation that may be needed or ordered today. We also reviewed her medications today. she has been encouraged to call the office with any questions or concerns that should arise related to todays visit.    No orders of the defined types were placed in this encounter.   Meds ordered this encounter  Medications   semaglutide -weight management (WEGOVY ) 1.7 MG/0.75ML SOAJ SQ injection    Sig: Inject 1.7 mg into the skin once a week.    Dispense:  3 mL    Refill:  2    Fill new script today    Return in about 2 months (around 05/03/2024) for F/U, eval new med, [facility] PCP.   Total time spent:30 Minutes Time spent includes review of chart, medications, test results, and follow up plan with  the patient.   [place] Controlled Substance Database was reviewed by me.  This patient was seen by Mardy Maxin, FNP-C in collaboration with Dr. Sigrid Bathe as a part of collaborative care agreement.   Madia Carvell R. Maxin, MSN, FNP-C Internal medicine

## 2024-05-05 ENCOUNTER — Ambulatory Visit: Admitting: Nurse Practitioner

## 2024-08-06 ENCOUNTER — Encounter: Admitting: Nurse Practitioner
